# Patient Record
Sex: Female | Born: 1950 | Race: White | Hispanic: No | Marital: Married | Smoking: Never smoker
Health system: Southern US, Community
[De-identification: ages and names within clinical notes are randomized; demographics above are authoritative.]

## PROBLEM LIST (undated history)

## (undated) DIAGNOSIS — R112 Nausea with vomiting, unspecified: Secondary | ICD-10-CM

## (undated) DIAGNOSIS — M25561 Pain in right knee: Secondary | ICD-10-CM

## (undated) DIAGNOSIS — M1990 Unspecified osteoarthritis, unspecified site: Secondary | ICD-10-CM

## (undated) DIAGNOSIS — E119 Type 2 diabetes mellitus without complications: Secondary | ICD-10-CM

## (undated) DIAGNOSIS — K635 Polyp of colon: Secondary | ICD-10-CM

## (undated) DIAGNOSIS — M19019 Primary osteoarthritis, unspecified shoulder: Secondary | ICD-10-CM

## (undated) DIAGNOSIS — Z9889 Other specified postprocedural states: Secondary | ICD-10-CM

## (undated) DIAGNOSIS — I10 Essential (primary) hypertension: Secondary | ICD-10-CM

## (undated) DIAGNOSIS — R011 Cardiac murmur, unspecified: Secondary | ICD-10-CM

## (undated) DIAGNOSIS — B019 Varicella without complication: Secondary | ICD-10-CM

## (undated) DIAGNOSIS — K5792 Diverticulitis of intestine, part unspecified, without perforation or abscess without bleeding: Secondary | ICD-10-CM

## (undated) DIAGNOSIS — T7840XA Allergy, unspecified, initial encounter: Secondary | ICD-10-CM

## (undated) DIAGNOSIS — F32A Depression, unspecified: Secondary | ICD-10-CM

## (undated) DIAGNOSIS — E785 Hyperlipidemia, unspecified: Secondary | ICD-10-CM

## (undated) DIAGNOSIS — K921 Melena: Secondary | ICD-10-CM

## (undated) DIAGNOSIS — F419 Anxiety disorder, unspecified: Secondary | ICD-10-CM

## (undated) DIAGNOSIS — R87619 Unspecified abnormal cytological findings in specimens from cervix uteri: Secondary | ICD-10-CM

## (undated) DIAGNOSIS — H409 Unspecified glaucoma: Secondary | ICD-10-CM

## (undated) DIAGNOSIS — F329 Major depressive disorder, single episode, unspecified: Secondary | ICD-10-CM

## (undated) DIAGNOSIS — K219 Gastro-esophageal reflux disease without esophagitis: Secondary | ICD-10-CM

## (undated) HISTORY — DX: Primary osteoarthritis, unspecified shoulder: M19.019

## (undated) HISTORY — DX: Polyp of colon: K63.5

## (undated) HISTORY — DX: Varicella without complication: B01.9

## (undated) HISTORY — DX: Melena: K92.1

## (undated) HISTORY — DX: Type 2 diabetes mellitus without complications: E11.9

## (undated) HISTORY — PX: CARPAL TUNNEL RELEASE: SHX101

## (undated) HISTORY — DX: Unspecified abnormal cytological findings in specimens from cervix uteri: R87.619

## (undated) HISTORY — DX: Hyperlipidemia, unspecified: E78.5

## (undated) HISTORY — DX: Allergy, unspecified, initial encounter: T78.40XA

## (undated) HISTORY — DX: Gastro-esophageal reflux disease without esophagitis: K21.9

## (undated) HISTORY — DX: Unspecified glaucoma: H40.9

## (undated) HISTORY — PX: HAND SURGERY: SHX662

## (undated) HISTORY — DX: Diverticulitis of intestine, part unspecified, without perforation or abscess without bleeding: K57.92

## (undated) HISTORY — DX: Depression, unspecified: F32.A

## (undated) HISTORY — DX: Anxiety disorder, unspecified: F41.9

## (undated) HISTORY — PX: HEMORRHOID SURGERY: SHX153

## (undated) HISTORY — DX: Major depressive disorder, single episode, unspecified: F32.9

## (undated) HISTORY — DX: Pain in right knee: M25.561

## (undated) HISTORY — PX: JOINT REPLACEMENT: SHX530

## (undated) HISTORY — DX: Cardiac murmur, unspecified: R01.1

## (undated) HISTORY — PX: OTHER SURGICAL HISTORY: SHX169

---

## 1998-05-18 ENCOUNTER — Ambulatory Visit (HOSPITAL_COMMUNITY): Admission: RE | Admit: 1998-05-18 | Discharge: 1998-05-18 | Payer: Self-pay | Admitting: *Deleted

## 1998-05-18 ENCOUNTER — Encounter: Payer: Self-pay | Admitting: *Deleted

## 1999-07-18 ENCOUNTER — Other Ambulatory Visit: Admission: RE | Admit: 1999-07-18 | Discharge: 1999-07-18 | Payer: Self-pay | Admitting: Internal Medicine

## 1999-07-21 ENCOUNTER — Encounter: Payer: Self-pay | Admitting: Internal Medicine

## 1999-07-21 ENCOUNTER — Ambulatory Visit (HOSPITAL_COMMUNITY): Admission: RE | Admit: 1999-07-21 | Discharge: 1999-07-21 | Payer: Self-pay | Admitting: Obstetrics and Gynecology

## 2000-06-21 ENCOUNTER — Encounter (INDEPENDENT_AMBULATORY_CARE_PROVIDER_SITE_OTHER): Payer: Self-pay | Admitting: Specialist

## 2000-06-21 ENCOUNTER — Other Ambulatory Visit: Admission: RE | Admit: 2000-06-21 | Discharge: 2000-06-21 | Payer: Self-pay | Admitting: *Deleted

## 2000-09-20 ENCOUNTER — Ambulatory Visit (HOSPITAL_COMMUNITY): Admission: RE | Admit: 2000-09-20 | Discharge: 2000-09-20 | Payer: Self-pay | Admitting: Internal Medicine

## 2000-09-20 ENCOUNTER — Encounter: Payer: Self-pay | Admitting: Internal Medicine

## 2001-08-14 ENCOUNTER — Other Ambulatory Visit: Admission: RE | Admit: 2001-08-14 | Discharge: 2001-08-14 | Payer: Self-pay | Admitting: *Deleted

## 2005-05-17 ENCOUNTER — Encounter: Admission: RE | Admit: 2005-05-17 | Discharge: 2005-05-17 | Payer: Self-pay | Admitting: Internal Medicine

## 2006-07-18 ENCOUNTER — Encounter: Admission: RE | Admit: 2006-07-18 | Discharge: 2006-07-18 | Payer: Self-pay

## 2006-09-23 ENCOUNTER — Inpatient Hospital Stay (HOSPITAL_COMMUNITY): Admission: RE | Admit: 2006-09-23 | Discharge: 2006-09-26 | Payer: Self-pay | Admitting: Orthopedic Surgery

## 2007-06-06 ENCOUNTER — Emergency Department: Payer: Self-pay | Admitting: Emergency Medicine

## 2007-09-23 ENCOUNTER — Encounter: Admission: RE | Admit: 2007-09-23 | Discharge: 2007-09-23 | Payer: Self-pay | Admitting: Internal Medicine

## 2007-11-08 ENCOUNTER — Emergency Department: Payer: Self-pay | Admitting: Emergency Medicine

## 2007-12-04 ENCOUNTER — Encounter: Admission: RE | Admit: 2007-12-04 | Discharge: 2007-12-04 | Payer: Self-pay | Admitting: Orthopedic Surgery

## 2008-03-22 ENCOUNTER — Encounter: Admission: RE | Admit: 2008-03-22 | Discharge: 2008-03-22 | Payer: Self-pay | Admitting: Internal Medicine

## 2008-09-23 ENCOUNTER — Encounter: Admission: RE | Admit: 2008-09-23 | Discharge: 2008-09-23 | Payer: Self-pay | Admitting: Internal Medicine

## 2009-10-06 ENCOUNTER — Encounter: Admission: RE | Admit: 2009-10-06 | Discharge: 2009-10-06 | Payer: Self-pay | Admitting: Internal Medicine

## 2010-09-29 NOTE — Op Note (Signed)
NAMEMarland Kitchen  Desiree Pittman, Desiree Pittman NO.:  [id number]   MEDICAL RECORD NO.:  [id number]          PATIENT TYPE:  INP   LOCATION:  X009                         FACILITY:  [facility]   PHYSICIAN:  Ollen Gross, M.D.    DATE OF BIRTH:  1951/04/14   DATE OF PROCEDURE:  DATE OF DISCHARGE:                               OPERATIVE REPORT   PREOPERATIVE DIAGNOSIS:  Osteoarthritis, left hip.   POSTOPERATIVE DIAGNOSIS:  Osteoarthritis, left hip.   PROCEDURE:  Left total hip arthroplasty.   SURGEON:  Ollen Gross, M.D.   ASSISTANT:  Alexzandrew L. Perkins, P.A.-C.   ANESTHESIA:  General.   ESTIMATED BLOOD LOSS:  General.   ESTIMATED BLOOD LOSS:  400.   DRAINS:  Hemovac x1.   COMPLICATIONS:  None.   CONDITION:  Stable to recovery.   CLINICAL NOTE:  Desiree Pittman is a 60 year old female who has end-stage  arthritis of the left hip, progressively worsening pain and dysfunction.  She has failed nonoperative management and presents for total hip  arthroplasty.   PROCEDURE IN DETAIL:  After the successful administration of a general  anesthetic, the patient is placed in a right lateral decubitus position  with the left side up and held with the hip positioner.  The left lower  extremity is isolated from her perineum with plastic drapes and prepped  and draped in the usual sterile fashion.  A short posterolateral  incision is made with a 10 blade through subcutaneous tissue to the  level of fascia lata, which is incised in line with the skin incision.  The sciatic nerve is palpated and protected and the short rotators  isolated off the femur.  Capsulectomy is performed and the hip is  dislocated.  The center of the femoral head is marked and a trial  prosthesis placed such that the center of the trial head corresponds to  the center of the native femoral head.  Osteotomy lines are marked on  the femoral neck and osteotomy made with an oscillating saw.  The  femoral head is removed and  the femur retracted anteriorly to gain  acetabular exposure.   Acetabular retractors are placed.  Labrum and osteophytes removed.  Reaming starts at 43 mm, coursing increments of 2 up to 49 mm.  Then a  50 mm Pinnacle acetabular shell is placed in anatomic position and  transfixed with a single dome screw with excellent purchase.  The apex  hole eliminator is placed and the permanent 36 mm neutral Ultramet metal  liner is placed for a metal-on-metal hip replacement.   The femur is prepared with the canal finder and irrigation.  Axial  reaming is performed up to 11.5 mm, proximal reaming to a 16-D and the  sleeve machined to a small.  A 16-D small trial sleeve is placed, a 16 x  11 stem, 36 standard neck, matching her native anteversion.  The trial  36 +0 head is placed.  The hip is reduced with outstanding stability.  It has full extension, full external rotation, 70 degrees flexion, 40  degrees adduction, 90 degrees internal rotation, then 90 degrees  flexion,  90 degrees internal rotation.  By placing the left leg on top  of the right, I felt as though the leg lengths were equal.  The hip was  then dislocated, trial femur removed and trial sleeve removed.  The  permanent 16 D small sleeve was then placed with a 16 x 11 stem, 36  standard neck, matching native anteversion.  A 36 +0 permanent head is  placed and the hip is reduced with the same stability parameters.  The  wound is copiously irrigated with saline solution and the short rotators  reattached to the femur through drill holes.  Fascia lata is closed over  a Hemovac drain with interrupted #1 Vicryl, subcu closed with #1 and #2-  0 Vicryl and subcuticular running 4-0 Monocryl.  The drain is hooked to  suction, incision cleaned and dried.  Steri-Strips and a bulky sterile  dressing applied.  She is then placed into a knee immobilizer, awakened,  and transferred to recovery in stable condition.      Ollen Gross, M.D.   Electronically Signed     FA/MEDQ  D:  09/23/2006  T:  09/23/2006  Job:  [id number]

## 2010-09-29 NOTE — Discharge Summary (Signed)
Desiree Pittman, Desiree Pittman NO.:  [id number]   MEDICAL RECORD NO.:  [id number]          PATIENT TYPE:  INP   LOCATION:  1620                         FACILITY:  [facility]   PHYSICIAN:  Ollen Gross, M.D.    DATE OF BIRTH:  [birth year]   DATE OF ADMISSION:  09/23/2006  DATE OF DISCHARGE:                               DISCHARGE SUMMARY   ADMITTING DIAGNOSES:  1. Osteoarthritis left hip.  2. Hypertension.  3. Hemorrhoids.  4. Postmenopausal.   DISCHARGE DIAGNOSES:  1. Osteoarthritis left hip status post left total hip arthroplasty.  2. Hypertension.  3. Hemorrhoids.  4. Postmenopausal.   PROCEDURE:  Sep 23, 2006, left total hip.  Surgeon:  Dr. Lequita Halt.  Assistant:  Avel Peace PA-C.  Anesthesia:  General.   CONSULTS:  None.   BRIEF HISTORY:  Ms. Nissan is a 60 year old female with end-stage  arthritis of left hip, progressive worsening pain and dysfunction,  failed nonoperative management and now presents for total hip  arthroplasty.   LABORATORY DATA:  Preoperative CBC showed hemoglobin of 14.5, hematocrit  of 42.5, normal white count of 6.9.  Serial H&H's were followed.  Last  noted H&H 11.1 and 32.0.  PT/PTT on admission 12.4 and 28 respectively,  INR 0.9.  Serial pro times followed.  Last noted PT/INR 18.4 and 1.5.  Chemistry panel on admission:  Minimally elevated sodium of 147.  Remaining chemistry panel within normal limits.  Serial BMETs were  followed.  Electrolytes remained within normal limits.  Preoperative UA  negative.  Blood group/type A positive.  Two-view chest Sep 13, 2006:  No  active cardiopulmonary disease.  EKG dated August 06, 2006:  Sinus  rhythm, within normal limits, confirmed.   HOSPITAL COURSE:  The patient admitted to [facility],  tolerated procedure well, later transferred to the recovery room and the  orthopedic floor.  Start on PCA and p.o. analgesic for pain control  following surgery.  Given 24 hours postoperative IV  antibiotics.  Started on Coumadin for DVT prophylaxis.  Actually was doing very well  on day #1, just had some incisional burning, tolerating medications  well.  Started getting up out of bed.  Hemoglobin looked good.  Blood  pressure looked good, a little on the lower side, so we held just the  Cozaar temporarily.  Discontinued the PCA after breakfast.  Started  getting up with PT.  Had good urinary output.  On day #2 was doing  fantastic, getting up ambulating 90 feet, then later 150 feet that day.  Dressing was changed and incision looked excellent.  Hemoglobin still  11.  Continued to do well and was ready to go home by the following day  of Sep 26, 2006.   DISCHARGE PLAN:  1. The patient was discharged home on Sep 26, 2006.  2. Discharge diagnoses:  Please see above.  3. Discharge medications:  Vicodin, Robaxin, and Coumadin.  4. Diet:  Low sodium diet.  5. Followup:  Two weeks.  Contact the office at [phone] to arrange      appointment time and followup with this patient.  6.  Activity:  Partial weightbearing 25-50% left lower extremity.  Home      health PT, home health nursing, total protocol, hip precautions.   DISPOSITION:  Home.   CONDITION UPON DISCHARGE:  Improved.      Alexzandrew L. Perkins, P.A.C.      Ollen Gross, M.D.  Electronically Signed    ALP/MEDQ  D:  09/26/2006  T:  09/26/2006  Job:  [id number]   cc:   Merlene Laughter. Renae Gloss, M.D.  Fax: [phone]

## 2010-09-29 NOTE — H&P (Signed)
NAME:  Desiree Pittman, Desiree Pittman               ACCOUNT NO.:  [id number]   MEDICAL RECORD NO.:  [id number]          PATIENT TYPE:  INP   LOCATION:  NA                           [facility]ACILITY:  [facility]   PHYSICIAN:  Ollen Gross, M.D.    DATE OF BIRTH:  21-Nov-1950   DATE OF ADMISSION:  08/24/2006  DATE OF DISCHARGE:                              HISTORY & PHYSICAL   CHIEF COMPLAINT:  Left hip pain   HISTORY OF PRESENT ILLNESS:  The patient is a 60 year old female who has  been seen by Dr. Lequita Halt in second opinion in regards to her left hip  which has progressively gotten worse over the past 8-10 months.  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  She is extremely active, but the arthritis and  pain have curtailed her activities.  She was seen in the office.  Her x-  rays showed severe end-stage arthritis of the left hip with bone-on-bone  and large cyst change of the femoral head and acetabulum.  It is felt  she would benefit from undergoing hip replacement.  Risks and benefits  have been discussed, and the patient has elected to proceed with  surgery.   ALLERGIES:  NO KNOWN DRUG ALLERGIES.   CURRENT MEDICATIONS:  Wild yam, fish oil, Crestor, Cozaar, Cardizem,  Relafen, Osteo Bi-Flex.   PAST MEDICAL HISTORY:  Hypertension, hemorrhoids, postmenopausal.   PAST SURGICAL HISTORY:  Carpal tunnel on the left, carpal tunnel on the  right and colonoscopy.   SOCIAL HISTORY:  Married.  Dental hygienist.  Nonsmoker.  One glass of  wine per day.  Two children.  Husband will be assisting with care after  surgery.   FAMILY HISTORY:  Father with history of lung cancer.  Mother and  grandmother with history of arthritis.   REVIEW OF SYSTEMS:  GENERAL:  No fevers, chills or night sweats.  NEURO:  No seizures, syncope or paralysis.  RESPIRATORY:  No shortness of  breath, productive cough or hemoptysis.  CARDIOVASCULAR:  No chest pain,  angina or  orthopnea.  GI:  No nausea, vomiting, diarrhea or  constipation.  GU:  No dysuria, hematuria or discharge.  MUSCULOSKELETAL:  Left hip.   PHYSICAL EXAMINATION:  VITAL SIGNS:  Pulse 68, respirations 16, blood  pressure 148/84.  GENERAL:  60 year old white female, well-nourished, well-developed, in  no acute distress.  She is alert, oriented and cooperative, pleasant and  an excellent historian.  HEENT:  Normocephalic, atraumatic.  Pupils round and reactive.  Oropharynx clear.  EOMs intact.  NECK:  Supple.  CHEST:  Clear.  HEART:  Regular rate and rhythm.  ABDOMEN:  Soft, nontender.  Bowel sounds present.  GENITALIA:  Not done.  EXTREMITIES:  Left hip shows flexion of 90 degrees, 0 internal rotation,  20 degrees of external rotation, 20-30 degrees of abduction.   IMPRESSION:  1. Osteoarthritis of the left hip.  2. Hypertension.  3. Hemorrhoids.  4. Postmenopausal.   PLAN:  The patient will be admitted to [facility] to  undergo a  left total hip replacement arthroplasty.  Surgery will be performed by  Dr. Ollen Gross.      Alexzandrew L. Perkins, P.A.C.      Ollen Gross, M.D.  Electronically Signed    ALP/MEDQ  D:  09/22/2006  T:  09/23/2006  Job:  [id number]   cc:   Merlene Laughter. Renae Gloss, M.D.  Fax: [phone]

## 2010-12-11 ENCOUNTER — Other Ambulatory Visit: Payer: Self-pay | Admitting: Obstetrics and Gynecology

## 2010-12-11 DIAGNOSIS — Z1231 Encounter for screening mammogram for malignant neoplasm of breast: Secondary | ICD-10-CM

## 2010-12-19 ENCOUNTER — Ambulatory Visit
Admission: RE | Admit: 2010-12-19 | Discharge: 2010-12-19 | Disposition: A | Discharge status: Home / Self Care | Payer: BC Managed Care – PPO | Source: Ambulatory Visit | Attending: Obstetrics and Gynecology | Admitting: Obstetrics and Gynecology

## 2010-12-19 DIAGNOSIS — Z1231 Encounter for screening mammogram for malignant neoplasm of breast: Secondary | ICD-10-CM

## 2010-12-22 ENCOUNTER — Other Ambulatory Visit: Payer: Self-pay | Admitting: Obstetrics and Gynecology

## 2010-12-22 DIAGNOSIS — R928 Other abnormal and inconclusive findings on diagnostic imaging of breast: Secondary | ICD-10-CM

## 2011-01-02 ENCOUNTER — Ambulatory Visit
Admission: RE | Admit: 2011-01-02 | Discharge: 2011-01-02 | Disposition: A | Discharge status: Home / Self Care | Payer: BC Managed Care – PPO | Source: Ambulatory Visit | Attending: Obstetrics and Gynecology | Admitting: Obstetrics and Gynecology

## 2011-01-02 DIAGNOSIS — R928 Other abnormal and inconclusive findings on diagnostic imaging of breast: Secondary | ICD-10-CM

## 2012-01-15 ENCOUNTER — Other Ambulatory Visit: Payer: Self-pay | Admitting: Obstetrics and Gynecology

## 2012-01-15 DIAGNOSIS — Z1231 Encounter for screening mammogram for malignant neoplasm of breast: Secondary | ICD-10-CM

## 2012-02-05 ENCOUNTER — Ambulatory Visit
Admission: RE | Admit: 2012-02-05 | Discharge: 2012-02-05 | Disposition: A | Discharge status: Home / Self Care | Payer: BC Managed Care – PPO | Source: Ambulatory Visit | Attending: Obstetrics and Gynecology | Admitting: Obstetrics and Gynecology

## 2012-02-05 DIAGNOSIS — Z1231 Encounter for screening mammogram for malignant neoplasm of breast: Secondary | ICD-10-CM

## 2012-02-06 ENCOUNTER — Emergency Department: Payer: Self-pay | Admitting: Emergency Medicine

## 2012-02-07 ENCOUNTER — Other Ambulatory Visit: Payer: Self-pay | Admitting: Obstetrics and Gynecology

## 2012-02-07 DIAGNOSIS — R928 Other abnormal and inconclusive findings on diagnostic imaging of breast: Secondary | ICD-10-CM

## 2012-02-12 ENCOUNTER — Ambulatory Visit
Admission: RE | Admit: 2012-02-12 | Discharge: 2012-02-12 | Disposition: A | Discharge status: Home / Self Care | Payer: BC Managed Care – PPO | Source: Ambulatory Visit | Attending: Obstetrics and Gynecology | Admitting: Obstetrics and Gynecology

## 2012-02-12 DIAGNOSIS — R928 Other abnormal and inconclusive findings on diagnostic imaging of breast: Secondary | ICD-10-CM

## 2012-03-06 ENCOUNTER — Other Ambulatory Visit: Payer: Self-pay | Admitting: Orthopedic Surgery

## 2012-03-06 MED ORDER — BUPIVACAINE LIPOSOME 1.3 % IJ SUSP: 20.0000 mL | Freq: Once | INTRAMUSCULAR | Status: DC

## 2012-03-06 MED ORDER — DEXAMETHASONE SODIUM PHOSPHATE 10 MG/ML IJ SOLN: 10.0000 mg | Freq: Once | INTRAMUSCULAR | Status: DC

## 2012-03-06 NOTE — Progress Notes (Signed)
Preoperative surgical orders have been place into the Epic hospital system for Desiree Pittman on 03/06/2012, 2:54 PM  by Patrica Duel for surgery on 04/23/12.  Preop Total Hip orders including Experel Injecion, IV Tylenol, and IV Decadron as long as there are no contraindications to the above medications. Avel Peace, PA-C

## 2012-04-04 ENCOUNTER — Encounter (HOSPITAL_COMMUNITY): Payer: Self-pay | Admitting: Pharmacy Technician

## 2012-04-09 ENCOUNTER — Other Ambulatory Visit (HOSPITAL_COMMUNITY): Payer: Self-pay | Admitting: *Deleted

## 2012-04-14 ENCOUNTER — Other Ambulatory Visit (HOSPITAL_COMMUNITY): Payer: Self-pay | Admitting: Orthopedic Surgery

## 2012-04-14 ENCOUNTER — Ambulatory Visit (HOSPITAL_COMMUNITY): Admission: RE | Admit: 2012-04-14 | Payer: BC Managed Care – PPO | Source: Ambulatory Visit

## 2012-04-14 ENCOUNTER — Encounter (HOSPITAL_COMMUNITY)
Admission: RE | Admit: 2012-04-14 | Discharge: 2012-04-14 | Disposition: A | Discharge status: Home / Self Care | Payer: BC Managed Care – PPO | Source: Ambulatory Visit | Attending: Orthopedic Surgery | Admitting: Orthopedic Surgery

## 2012-04-14 ENCOUNTER — Ambulatory Visit (HOSPITAL_COMMUNITY)
Admission: RE | Admit: 2012-04-14 | Discharge: 2012-04-14 | Disposition: A | Discharge status: Home / Self Care | Payer: BC Managed Care – PPO | Source: Ambulatory Visit | Attending: Orthopedic Surgery | Admitting: Orthopedic Surgery

## 2012-04-14 ENCOUNTER — Encounter (HOSPITAL_COMMUNITY): Payer: Self-pay

## 2012-04-14 DIAGNOSIS — Z01811 Encounter for preprocedural respiratory examination: Secondary | ICD-10-CM | POA: Insufficient documentation

## 2012-04-14 HISTORY — DX: Nausea with vomiting, unspecified: R11.2

## 2012-04-14 HISTORY — DX: Other specified postprocedural states: R11.2

## 2012-04-14 HISTORY — DX: Other specified postprocedural states: Z98.890

## 2012-04-14 HISTORY — DX: Essential (primary) hypertension: I10

## 2012-04-14 HISTORY — DX: Unspecified osteoarthritis, unspecified site: M19.90

## 2012-04-14 LAB — COMPREHENSIVE METABOLIC PANEL
Alkaline Phosphatase: 80 U/L (ref 39–117)
BUN: 16 mg/dL (ref 6–23)
CO2: 28 mEq/L (ref 19–32)
Chloride: 99 mEq/L (ref 96–112)
GFR calc Af Amer: >90 mL/min (ref >90)
GFR calc non Af Amer: >90 mL/min (ref >90)
Glucose, Bld: 97 mg/dL (ref 70–99)
Potassium: 4.4 mEq/L (ref 3.5–5.1)
Total Bilirubin: 0.4 mg/dL (ref 0.3–1.2)

## 2012-04-14 LAB — URINALYSIS, ROUTINE W REFLEX MICROSCOPIC
Bilirubin Urine: NEGATIVE
Hgb urine dipstick: NEGATIVE
Specific Gravity, Urine: 1.008 (ref 1.005–1.030)
Urobilinogen, UA: 0.2 mg/dL (ref 0.0–1.0)

## 2012-04-14 LAB — CBC
HCT: 43.9 % (ref 36.0–46.0)
Hemoglobin: 15 g/dL (ref 12.0–15.0)
WBC: 6.8 10*3/uL (ref 4.0–10.5)

## 2012-04-14 LAB — PROTIME-INR: Prothrombin Time: 11.8 seconds (ref 11.6–15.2)

## 2012-04-14 LAB — APTT: aPTT: 26 seconds (ref 24–37)

## 2012-04-14 NOTE — Patient Instructions (Signed)
20      Your procedure is scheduled on:  Wednesday 04/23/2012 at 1100 am  Report to [facility] at 0830  AM.  Call this number if you have problems the morning of surgery: [phone]   Remember:   Do not eat food or drink liquids after midnight!  Take these medicines the morning of surgery with A SIP OF WATER: Diltiazem   Do not bring valuables to the hospital.  .  Leave suitcase in the car. After surgery it may be brought to your room.  For patients admitted to the hospital, checkout time is 11:00 AM the day of              Discharge.    Special Instructions: See [facility] Preparing  For Surgery Instruction Sheet. Do not wear jewelry, lotions powders, perfumes. Women do not shave legs or underarms for 12 hours before showers. Contacts, partial plates, or dentures may not be worn into surgery.                          Patients discharged the day of surgery will not be allowed to drive home. If going home the same day of surgery, must have someone stay with you first 24 hrs.at home and arrange for someone to drive you home from the  Hospital.              YOUR DRIVER WU:[id number] Hyacinth Meeker   Please read over the following fact sheets that you were given: MRSA INFORMATION,INCENTIVE SPIROMETRY SHEET, SLEEP APNEA SHEET, BLOOD TRANSFUSION SHEET                            Telford Nab.Wendal Wilkie,RN,BSN     [phone]

## 2012-04-22 ENCOUNTER — Other Ambulatory Visit: Payer: Self-pay | Admitting: Orthopedic Surgery

## 2012-04-22 NOTE — H&P (Signed)
Desiree Pittman  DOB: 08/21/1950 Married / Language: English / Race: White Female  Date of Admission:  04/23/2012  Chief complaint: Bilateral hip pain  History of Present Illness The patient is a 61 year old female who comes in today for a preoperative History and Physical. The patient is scheduled for a right total hip arthroplasty and a left hip bearing exchange versus left total hip revision to be performed by Dr. Frank V. Aluisio, MD at [facility] on 04/23/2012. The patient is a 60 year old female who presented with a hip problems. The patient reports left lateral hip and right anterior hip problems including pain. Desiree Pittman comes in for recheck of her left total hip arthroplasty and right hip arthritis. Her left total hip, which she had done in 2008 gave her several episodes of clunking and popping. She interpreted these as dislocations. She never had to go to the hospital for this. Her husband would just kind of pick her up and let her leg dangle and it would feel like it went back in. Over the last year she has not had any further episodes of that, but this past week had an episode of it while working out in the garden. Again, her husband just picked her up and let her hang her leg and it felt like it went back into place. She does not have much pain at rest, but with activities she has pain and it gives way and feels like it catches intermittently. No fevers, constitutional symptoms. She got a MARS MRI of her left hip to look for both effusion and impingement and will also get metallosis labs. The MRI scan was reviews. She definitely has a large fluid collection. There was concerned she may have a sensitivity to the metal. It wasn't felt that she had metallosis because her iron levels are negligible. Iy rect was recommended that she revise the bearing surface and probably have to revise the cuff because of the low abduction angle. She might be getting some  impingement. We discussed all this in detail. She wants to proceed with the surgery. Her right hip has just been giving her achy discomfort in the anterior groin with activities.The patient is being followed for their right hip pain. They are several months out from intra articular She wuold like to go ahead and get the right hip replaced at the same time if all possible. They have been treated conservatively in the past for the above stated problem and despite conservative measures, they continue to have progressive pain and severe functional limitations and dysfunction. They have failed non-operative management including home exercise, medications, and injections. It is felt that they would benefit from undergoing total joint replacement. Risks and benefits of the procedure have been discussed with the patient and they elect to proceed with surgery. There are no active contraindications to surgery such as ongoing infection or rapidly progressive neurological disease.     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 12/13/2006   Allergies No Known Drug Allergies.   Family History Cerebrovascular Accident. mother Hypertension. mother Osteoarthritis. mother and grandmother mothers side Cancer. father and grandfather mothers side   Social History Living situation. live with spouse Marital status. married Number of flights of stairs before winded. 2-3 Drug/Alcohol Rehab (Previously). no Exercise. Exercises monthly; does running / walking and other Illicit drug use. no Pain Contract. no Tobacco use. never smoker Children. 2 Current work status. working full time Drug/Alcohol Rehab (Currently).   no Alcohol use. current drinker; drinks wine; 8-14 per week   Medication History Diltiazem CD (240MG Capsule ER 24HR, Oral) Active. Losartan Potassium-HCTZ ( Oral) Specific dose unknown - Active. Norco (5-325MG  Tablet, 1-2 Tablet Oral po qhs prn pain, Taken starting 04/15/2012) Active. (called to [facility] [id number]) Mobic (7.5MG/5ML Suspension, Oral) Active. TraMADol HCl (50MG Tablet, Oral) Active. (prn) Aspirin EC Low Strength (81MG Tablet DR, Oral) Active.   Pregnancy / Birth History Pregnant. no   Past Surgical History Carpal Tunnel Repair. bilateral Total Hip Replacement. left Tendon/Nerve/Artery Repair Left Finger   Medical History High blood pressure Osteoarthritis Hemorrhoids Menopause   Review of Systems General:Not Present- Chills, Fever, Night Sweats, Fatigue, Weight Gain, Weight Loss and Memory Loss. Skin:Not Present- Hives, Itching, Rash, Eczema and Lesions. HEENT:Not Present- Tinnitus, Headache, Double Vision, Visual Loss, Hearing Loss and Dentures. Respiratory:Not Present- Shortness of breath with exertion, Shortness of breath at rest, Allergies, Coughing up blood and Chronic Cough. Cardiovascular:Not Present- Chest Pain, Racing/skipping heartbeats, Difficulty Breathing Lying Down, Murmur, Swelling and Palpitations. Gastrointestinal:Not Present- Bloody Stool, Heartburn, Abdominal Pain, Vomiting, Nausea, Constipation, Diarrhea, Difficulty Swallowing, Jaundice and Loss of appetitie. Female Genitourinary:Not Present- Blood in Urine, Urinary frequency, Weak urinary stream, Discharge, Flank Pain, Incontinence, Painful Urination, Urgency, Urinary Retention and Urinating at Night. Musculoskeletal:Present- Joint Swelling, Joint Pain and Back Pain. Not Present- Muscle Weakness, Muscle Pain, Morning Stiffness and Spasms. Neurological:Not Present- Tremor, Dizziness, Blackout spells, Paralysis, Difficulty with balance and Weakness. Psychiatric:Not Present- Insomnia.   Vitals Weight: 142 lb Height: 63 in Weight was reported by patient. Height was reported by patient. Body Surface Area: 1.69 m Body Mass Index: 25.15 kg/m Pulse: 84 (Regular) Resp.: 16  (Unlabored) BP: 134/78 (Sitting, Left Arm, Standard)    Physical Exam The physical exam findings are as follows:   General Mental Status - Alert, cooperative and good historian. General Appearance- pleasant. Not in acute distress. Orientation- Oriented X3. Build & Nutrition- Well nourished and Well developed.   Head and Neck Head- normocephalic, atraumatic . Neck Global Assessment- supple. no bruit auscultated on the right and no bruit auscultated on the left.   Eye Pupil- Bilateral- Regular and Round. Motion- Bilateral- EOMI.   Chest and Lung Exam Auscultation: Breath sounds:- clear at anterior chest wall and - clear at posterior chest wall. Adventitious sounds:- No Adventitious sounds.   Cardiovascular Auscultation:Rhythm- Regular rate and rhythm. Heart Sounds- S1 WNL and S2 WNL. Murmurs & Other Heart Sounds:Auscultation of the heart reveals - No Murmurs.   Abdomen Palpation/Percussion:Tenderness- Abdomen is non-tender to palpation. Rigidity (guarding)- Abdomen is soft. Auscultation:Auscultation of the abdomen reveals - Bowel sounds normal.   Female Genitourinary Not done, not pertinent to present illness  Musculoskeletal Well developed female, alert and oriented in no apparent distress. Right hip flexion is to about 100, minimal internal rotation, about 10 external rotation, 30 abduction. Left hip flexion is to 110, rotation in 30, out 40, abduction 40 without discomfort. I reviewed her plain radiographs from back around April. The prosthesis is in good position. She has metal on metal. Cup is slightly horizontal, but the abduction angle is in the 40's. She does not have any osteolysis. No bone loss. Right hip has bone on bone arthritis.  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  Plan is for home.    Signed electronically by DREW L PERKINS, PA-C  

## 2012-04-23 ENCOUNTER — Ambulatory Visit (HOSPITAL_COMMUNITY): Payer: BC Managed Care – PPO | Admitting: Anesthesiology

## 2012-04-23 ENCOUNTER — Encounter (HOSPITAL_COMMUNITY): Payer: Self-pay | Admitting: *Deleted

## 2012-04-23 ENCOUNTER — Inpatient Hospital Stay (HOSPITAL_COMMUNITY)
Admission: RE | Admit: 2012-04-23 | Discharge: 2012-04-26 | DRG: 471 | Disposition: A | Discharge status: Home Health | Payer: BC Managed Care – PPO | Source: Ambulatory Visit | Attending: Orthopedic Surgery | Admitting: Orthopedic Surgery

## 2012-04-23 ENCOUNTER — Ambulatory Visit (HOSPITAL_COMMUNITY): Payer: BC Managed Care – PPO

## 2012-04-23 ENCOUNTER — Encounter (HOSPITAL_COMMUNITY): Payer: Self-pay | Admitting: Anesthesiology

## 2012-04-23 ENCOUNTER — Encounter (HOSPITAL_COMMUNITY)
Admission: RE | Disposition: A | Discharge status: Home Health | Payer: Self-pay | Source: Ambulatory Visit | Attending: Orthopedic Surgery

## 2012-04-23 DIAGNOSIS — M169 Osteoarthritis of hip, unspecified: Secondary | ICD-10-CM | POA: Diagnosis present

## 2012-04-23 DIAGNOSIS — M1610 Unilateral primary osteoarthritis, unspecified hip: Secondary | ICD-10-CM | POA: Diagnosis present

## 2012-04-23 DIAGNOSIS — D62 Acute posthemorrhagic anemia: Secondary | ICD-10-CM | POA: Diagnosis not present

## 2012-04-23 DIAGNOSIS — T84099A Other mechanical complication of unspecified internal joint prosthesis, initial encounter: Principal | ICD-10-CM | POA: Diagnosis present

## 2012-04-23 DIAGNOSIS — Y831 Surgical operation with implant of artificial internal device as the cause of abnormal reaction of the patient, or of later complication, without mention of misadventure at the time of the procedure: Secondary | ICD-10-CM | POA: Diagnosis present

## 2012-04-23 DIAGNOSIS — Z96649 Presence of unspecified artificial hip joint: Secondary | ICD-10-CM

## 2012-04-23 DIAGNOSIS — E871 Hypo-osmolality and hyponatremia: Secondary | ICD-10-CM | POA: Diagnosis not present

## 2012-04-23 DIAGNOSIS — I10 Essential (primary) hypertension: Secondary | ICD-10-CM | POA: Diagnosis present

## 2012-04-23 HISTORY — PX: TOTAL HIP ARTHROPLASTY: SHX124

## 2012-04-23 HISTORY — PX: TOTAL HIP REVISION: SHX763

## 2012-04-23 LAB — GRAM STAIN

## 2012-04-23 SURGERY — ARTHROPLASTY, HIP, TOTAL, ANTERIOR APPROACH
Anesthesia: General | Site: Hip | Laterality: Right | Wound class: Clean

## 2012-04-23 MED ORDER — PHENOL 1.4 % MT LIQD
1.0000 | OROMUCOSAL | Status: DC | PRN
  Filled 2012-04-23: qty 177

## 2012-04-23 MED ORDER — SODIUM CHLORIDE 0.9 % IJ SOLN
INTRAMUSCULAR | Status: DC | PRN
  Administered 2012-04-23: 50 mL

## 2012-04-23 MED ORDER — MIDAZOLAM HCL 5 MG/5ML IJ SOLN
INTRAMUSCULAR | Status: DC | PRN
  Administered 2012-04-23: 2 mg via INTRAVENOUS

## 2012-04-23 MED ORDER — HETASTARCH-ELECTROLYTES 6 % IV SOLN
INTRAVENOUS | Status: DC | PRN
  Administered 2012-04-23: 15:00:00 via INTRAVENOUS

## 2012-04-23 MED ORDER — RIVAROXABAN 10 MG PO TABS
10.0000 mg | ORAL_TABLET | Freq: Every day | ORAL | Status: DC
  Administered 2012-04-24 – 2012-04-26 (×3): 10 mg via ORAL
  Filled 2012-04-23 (×4): qty 1

## 2012-04-23 MED ORDER — HEPARIN SODIUM (PORCINE) 5000 UNIT/ML IJ SOLN
5000.0000 [IU] | Freq: Once | INTRAMUSCULAR | Status: DC
  Filled 2012-04-23: qty 1

## 2012-04-23 MED ORDER — ONDANSETRON HCL 4 MG/2ML IJ SOLN: 4.0000 mg | Freq: Four times a day (QID) | INTRAMUSCULAR | Status: DC | PRN

## 2012-04-23 MED ORDER — MEPERIDINE HCL 50 MG/ML IJ SOLN: 6.2500 mg | INTRAMUSCULAR | Status: DC | PRN

## 2012-04-23 MED ORDER — DEXAMETHASONE 6 MG PO TABS
10.0000 mg | ORAL_TABLET | Freq: Once | ORAL | Status: AC
  Administered 2012-04-24: 10 mg via ORAL
  Filled 2012-04-23: qty 1

## 2012-04-23 MED ORDER — FLEET ENEMA 7-19 GM/118ML RE ENEM: 1.0000 | ENEMA | Freq: Once | RECTAL | Status: AC | PRN

## 2012-04-23 MED ORDER — SCOPOLAMINE 1 MG/3DAYS TD PT72
MEDICATED_PATCH | TRANSDERMAL | Status: AC
  Filled 2012-04-23: qty 1

## 2012-04-23 MED ORDER — PROPOFOL 10 MG/ML IV BOLUS
INTRAVENOUS | Status: DC | PRN
  Administered 2012-04-23: 150 mg via INTRAVENOUS

## 2012-04-23 MED ORDER — MORPHINE SULFATE 2 MG/ML IJ SOLN: 1.0000 mg | INTRAMUSCULAR | Status: DC | PRN

## 2012-04-23 MED ORDER — DOCUSATE SODIUM 100 MG PO CAPS
100.0000 mg | ORAL_CAPSULE | Freq: Two times a day (BID) | ORAL | Status: DC
  Administered 2012-04-23 – 2012-04-26 (×6): 100 mg via ORAL
  Filled 2012-04-23: qty 1

## 2012-04-23 MED ORDER — ONDANSETRON HCL 4 MG/2ML IJ SOLN
INTRAMUSCULAR | Status: DC | PRN
  Administered 2012-04-23 (×2): 4 mg via INTRAVENOUS

## 2012-04-23 MED ORDER — ACETAMINOPHEN 10 MG/ML IV SOLN
INTRAVENOUS | Status: AC
  Filled 2012-04-23: qty 100

## 2012-04-23 MED ORDER — MENTHOL 3 MG MT LOZG: 1.0000 | LOZENGE | OROMUCOSAL | Status: DC | PRN

## 2012-04-23 MED ORDER — METHOCARBAMOL 500 MG PO TABS
500.0000 mg | ORAL_TABLET | Freq: Four times a day (QID) | ORAL | Status: DC | PRN
  Filled 2012-04-23: qty 1

## 2012-04-23 MED ORDER — CEFAZOLIN SODIUM-DEXTROSE 2-3 GM-% IV SOLR
INTRAVENOUS | Status: AC
Start: 2012-04-23 — End: 2012-04-23
  Filled 2012-04-23: qty 50

## 2012-04-23 MED ORDER — OXYCODONE HCL 5 MG/5ML PO SOLN: 5.0000 mg | Freq: Once | ORAL | Status: DC | PRN

## 2012-04-23 MED ORDER — LACTATED RINGERS IV SOLN
INTRAVENOUS | Status: DC
  Administered 2012-04-23: 1000 mL via INTRAVENOUS
  Administered 2012-04-23: 13:00:00 via INTRAVENOUS

## 2012-04-23 MED ORDER — OXYCODONE HCL 5 MG PO TABS: 5.0000 mg | ORAL_TABLET | Freq: Once | ORAL | Status: DC | PRN

## 2012-04-23 MED ORDER — HYDROCHLOROTHIAZIDE 12.5 MG PO CAPS
12.5000 mg | ORAL_CAPSULE | Freq: Every day | ORAL | Status: DC
  Administered 2012-04-24 – 2012-04-26 (×3): 12.5 mg via ORAL
  Filled 2012-04-23 (×3): qty 1

## 2012-04-23 MED ORDER — BUPIVACAINE LIPOSOME 1.3 % IJ SUSP
INTRAMUSCULAR | Status: DC | PRN
  Administered 2012-04-23: 10 mL

## 2012-04-23 MED ORDER — DEXAMETHASONE SODIUM PHOSPHATE 10 MG/ML IJ SOLN
INTRAMUSCULAR | Status: DC | PRN
  Administered 2012-04-23: 10 mg via INTRAVENOUS

## 2012-04-23 MED ORDER — TRAMADOL HCL 50 MG PO TABS
50.0000 mg | ORAL_TABLET | Freq: Four times a day (QID) | ORAL | Status: DC | PRN
  Administered 2012-04-23 – 2012-04-25 (×2): 100 mg via ORAL
  Filled 2012-04-23 (×2): qty 2

## 2012-04-23 MED ORDER — METOCLOPRAMIDE HCL 5 MG/ML IJ SOLN
INTRAMUSCULAR | Status: DC | PRN
  Administered 2012-04-23: 10 mg via INTRAVENOUS

## 2012-04-23 MED ORDER — ROCURONIUM BROMIDE 100 MG/10ML IV SOLN
INTRAVENOUS | Status: DC | PRN
  Administered 2012-04-23: 20 mg via INTRAVENOUS
  Administered 2012-04-23: 40 mg via INTRAVENOUS
  Administered 2012-04-23: 20 mg via INTRAVENOUS

## 2012-04-23 MED ORDER — ACETAMINOPHEN 650 MG RE SUPP: 650.0000 mg | Freq: Four times a day (QID) | RECTAL | Status: DC | PRN

## 2012-04-23 MED ORDER — SCOPOLAMINE 1 MG/3DAYS TD PT72
1.0000 | MEDICATED_PATCH | TRANSDERMAL | Status: DC
  Administered 2012-04-23: 1.5 mg via TRANSDERMAL
  Filled 2012-04-23: qty 1

## 2012-04-23 MED ORDER — DEXAMETHASONE SODIUM PHOSPHATE 10 MG/ML IJ SOLN: 10.0000 mg | Freq: Once | INTRAMUSCULAR | Status: AC

## 2012-04-23 MED ORDER — TEMAZEPAM 15 MG PO CAPS
15.0000 mg | ORAL_CAPSULE | Freq: Every evening | ORAL | Status: DC | PRN
Start: 2012-04-23 — End: 2012-04-26

## 2012-04-23 MED ORDER — LOSARTAN POTASSIUM 50 MG PO TABS
50.0000 mg | ORAL_TABLET | Freq: Every day | ORAL | Status: DC
  Administered 2012-04-24 – 2012-04-26 (×3): 50 mg via ORAL
  Filled 2012-04-23 (×3): qty 1

## 2012-04-23 MED ORDER — PROPOFOL INFUSION 10 MG/ML OPTIME
INTRAVENOUS | Status: DC | PRN
  Administered 2012-04-23: 50 ug/kg/min via INTRAVENOUS

## 2012-04-23 MED ORDER — DEXTROSE-NACL 5-0.9 % IV SOLN
INTRAVENOUS | Status: DC
  Administered 2012-04-23: 18:00:00 via INTRAVENOUS
  Administered 2012-04-24: 20 mL via INTRAVENOUS

## 2012-04-23 MED ORDER — ACETAMINOPHEN 10 MG/ML IV SOLN: 1000.0000 mg | Freq: Once | INTRAVENOUS | Status: DC | PRN

## 2012-04-23 MED ORDER — PROMETHAZINE HCL 25 MG/ML IJ SOLN: 6.2500 mg | INTRAMUSCULAR | Status: DC | PRN

## 2012-04-23 MED ORDER — METOCLOPRAMIDE HCL 10 MG PO TABS: 5.0000 mg | ORAL_TABLET | Freq: Three times a day (TID) | ORAL | Status: DC | PRN

## 2012-04-23 MED ORDER — FENTANYL CITRATE 0.05 MG/ML IJ SOLN
INTRAMUSCULAR | Status: DC | PRN
  Administered 2012-04-23 (×2): 50 ug via INTRAVENOUS
  Administered 2012-04-23: 100 ug via INTRAVENOUS
  Administered 2012-04-23 (×6): 50 ug via INTRAVENOUS

## 2012-04-23 MED ORDER — LOSARTAN POTASSIUM-HCTZ 50-12.5 MG PO TABS: 1.0000 | ORAL_TABLET | Freq: Every morning | ORAL | Status: DC

## 2012-04-23 MED ORDER — SODIUM CHLORIDE 0.9 % IV SOLN: INTRAVENOUS | Status: DC

## 2012-04-23 MED ORDER — ACETAMINOPHEN 325 MG PO TABS: 650.0000 mg | ORAL_TABLET | Freq: Four times a day (QID) | ORAL | Status: DC | PRN

## 2012-04-23 MED ORDER — 0.9 % SODIUM CHLORIDE (POUR BTL) OPTIME
TOPICAL | Status: DC | PRN
  Administered 2012-04-23: 1000 mL

## 2012-04-23 MED ORDER — DILTIAZEM HCL ER 240 MG PO CP24
240.0000 mg | ORAL_CAPSULE | Freq: Every morning | ORAL | Status: DC
  Administered 2012-04-24 – 2012-04-26 (×3): 240 mg via ORAL
  Filled 2012-04-23 (×3): qty 1

## 2012-04-23 MED ORDER — ACETAMINOPHEN 10 MG/ML IV SOLN
1000.0000 mg | Freq: Four times a day (QID) | INTRAVENOUS | Status: AC
  Administered 2012-04-23 – 2012-04-24 (×4): 1000 mg via INTRAVENOUS
  Filled 2012-04-23 (×6): qty 100

## 2012-04-23 MED ORDER — ONDANSETRON HCL 4 MG PO TABS: 4.0000 mg | ORAL_TABLET | Freq: Four times a day (QID) | ORAL | Status: DC | PRN

## 2012-04-23 MED ORDER — BISACODYL 10 MG RE SUPP
10.0000 mg | Freq: Every day | RECTAL | Status: DC | PRN
Start: 2012-04-23 — End: 2012-04-26
  Administered 2012-04-26: 10 mg via RECTAL
  Filled 2012-04-23: qty 1

## 2012-04-23 MED ORDER — DIPHENHYDRAMINE HCL 12.5 MG/5ML PO ELIX: 12.5000 mg | ORAL_SOLUTION | ORAL | Status: DC | PRN

## 2012-04-23 MED ORDER — OXYCODONE HCL 5 MG PO TABS
5.0000 mg | ORAL_TABLET | ORAL | Status: DC | PRN
  Administered 2012-04-24 (×4): 10 mg via ORAL
  Administered 2012-04-25: 5 mg via ORAL
  Administered 2012-04-25 (×2): 10 mg via ORAL
  Administered 2012-04-26 (×2): 5 mg via ORAL
  Filled 2012-04-23 (×2): qty 1
  Filled 2012-04-23: qty 2
  Filled 2012-04-23: qty 1
  Filled 2012-04-23 (×5): qty 2
  Filled 2012-04-23: qty 1

## 2012-04-23 MED ORDER — HYDROMORPHONE HCL PF 1 MG/ML IJ SOLN
0.2500 mg | INTRAMUSCULAR | Status: DC | PRN
  Administered 2012-04-23 (×4): 0.5 mg via INTRAVENOUS

## 2012-04-23 MED ORDER — METHOCARBAMOL 100 MG/ML IJ SOLN
500.0000 mg | Freq: Four times a day (QID) | INTRAVENOUS | Status: DC | PRN
  Administered 2012-04-23: 500 mg via INTRAVENOUS
  Filled 2012-04-23: qty 5

## 2012-04-23 MED ORDER — GLYCOPYRROLATE 0.2 MG/ML IJ SOLN
INTRAMUSCULAR | Status: DC | PRN
  Administered 2012-04-23: 0.6 mg via INTRAVENOUS

## 2012-04-23 MED ORDER — HYDROMORPHONE HCL PF 1 MG/ML IJ SOLN
INTRAMUSCULAR | Status: AC
  Filled 2012-04-23: qty 1

## 2012-04-23 MED ORDER — POLYETHYLENE GLYCOL 3350 17 G PO PACK
17.0000 g | PACK | Freq: Every day | ORAL | Status: DC | PRN
  Administered 2012-04-24 – 2012-04-25 (×2): 17 g via ORAL

## 2012-04-23 MED ORDER — CEFAZOLIN SODIUM 1-5 GM-% IV SOLN
1.0000 g | Freq: Four times a day (QID) | INTRAVENOUS | Status: AC
  Administered 2012-04-23 (×2): 1 g via INTRAVENOUS
  Filled 2012-04-23 (×2): qty 50

## 2012-04-23 MED ORDER — NEOSTIGMINE METHYLSULFATE 1 MG/ML IJ SOLN
INTRAMUSCULAR | Status: DC | PRN
  Administered 2012-04-23: 5 mg via INTRAVENOUS

## 2012-04-23 MED ORDER — METOCLOPRAMIDE HCL 5 MG/ML IJ SOLN: 5.0000 mg | Freq: Three times a day (TID) | INTRAMUSCULAR | Status: DC | PRN

## 2012-04-23 MED ORDER — CEFAZOLIN SODIUM-DEXTROSE 2-3 GM-% IV SOLR
2.0000 g | INTRAVENOUS | Status: AC
  Administered 2012-04-23: 2 g via INTRAVENOUS

## 2012-04-23 MED ORDER — ACETAMINOPHEN 10 MG/ML IV SOLN
1000.0000 mg | Freq: Once | INTRAVENOUS | Status: AC
  Administered 2012-04-23: 1000 mg via INTRAVENOUS

## 2012-04-23 MED ORDER — EPHEDRINE SULFATE 50 MG/ML IJ SOLN
INTRAMUSCULAR | Status: DC | PRN
  Administered 2012-04-23: 10 mg via INTRAVENOUS
  Administered 2012-04-23: 5 mg via INTRAVENOUS

## 2012-04-23 MED ORDER — BUPIVACAINE LIPOSOME 1.3 % IJ SUSP
20.0000 mL | Freq: Once | INTRAMUSCULAR | Status: DC
  Filled 2012-04-23: qty 20

## 2012-04-23 SURGICAL SUPPLY — 57 items
BAG SPEC THK2 15X12 ZIP CLS (MISCELLANEOUS) ×2
BAG ZIPLOCK 12X15 (MISCELLANEOUS) ×7 IMPLANT
BIT DRILL 2.8X128 (BIT) ×3 IMPLANT
BLADE EXTENDED COATED 6.5IN (ELECTRODE) ×3 IMPLANT
BLADE SAW SGTL 18X1.27X75 (BLADE) ×3 IMPLANT
CLOTH BEACON ORANGE TIMEOUT ST (SAFETY) ×3 IMPLANT
CLSR STERI-STRIP ANTIMIC 1/2X4 (GAUZE/BANDAGES/DRESSINGS) ×10 IMPLANT
CONT SPECI 4OZ STER CLIK (MISCELLANEOUS) ×3 IMPLANT
DECANTER SPIKE VIAL GLASS SM (MISCELLANEOUS) ×6 IMPLANT
DRAPE C-ARM 42X72 X-RAY (DRAPES) ×3 IMPLANT
DRAPE INCISE IOBAN 66X45 STRL (DRAPES) ×3 IMPLANT
DRAPE ORTHO SPLIT 77X108 STRL (DRAPES) ×6
DRAPE POUCH INSTRU U-SHP 10X18 (DRAPES) ×3 IMPLANT
DRAPE STERI IOBAN 125X83 (DRAPES) ×3 IMPLANT
DRAPE SURG ORHT 6 SPLT 77X108 (DRAPES) ×4 IMPLANT
DRAPE U-SHAPE 47X51 STRL (DRAPES) ×12 IMPLANT
DRSG ADAPTIC 3X8 NADH LF (GAUZE/BANDAGES/DRESSINGS) ×6 IMPLANT
DRSG MEPILEX BORDER 4X4 (GAUZE/BANDAGES/DRESSINGS) ×6 IMPLANT
DRSG MEPILEX BORDER 4X8 (GAUZE/BANDAGES/DRESSINGS) ×6 IMPLANT
DURAPREP 26ML APPLICATOR (WOUND CARE) ×6 IMPLANT
ELECT REM PT RETURN 9FT ADLT (ELECTROSURGICAL) ×6
ELECTRODE REM PT RTRN 9FT ADLT (ELECTROSURGICAL) ×4 IMPLANT
EVACUATOR 1/8 PVC DRAIN (DRAIN) ×6 IMPLANT
FACESHIELD LNG OPTICON STERILE (SAFETY) ×22 IMPLANT
GLOVE BIO SURGEON STRL SZ8 (GLOVE) ×6 IMPLANT
GLOVE BIOGEL PI IND STRL 8 (GLOVE) ×4 IMPLANT
GLOVE BIOGEL PI INDICATOR 8 (GLOVE) ×4
GLOVE ECLIPSE 8.0 STRL XLNG CF (GLOVE) ×4 IMPLANT
GOWN STRL NON-REIN LRG LVL3 (GOWN DISPOSABLE) ×8 IMPLANT
GOWN STRL REIN XL XLG (GOWN DISPOSABLE) ×5 IMPLANT
IMMOBILIZER KNEE 20 (SOFTGOODS) ×3
IMMOBILIZER KNEE 20 THIGH 36 (SOFTGOODS) IMPLANT
KIT BASIN OR (CUSTOM PROCEDURE TRAY) ×3 IMPLANT
MANIFOLD NEPTUNE II (INSTRUMENTS) ×3 IMPLANT
NDL SAFETY ECLIPSE 18X1.5 (NEEDLE) ×4 IMPLANT
NEEDLE HYPO 18GX1.5 SHARP (NEEDLE) ×6
NS IRRIG 1000ML POUR BTL (IV SOLUTION) ×3 IMPLANT
PACK TOTAL JOINT (CUSTOM PROCEDURE TRAY) ×4 IMPLANT
PACK UNIVERSAL I (CUSTOM PROCEDURE TRAY) ×3 IMPLANT
PADDING CAST COTTON 6X4 STRL (CAST SUPPLIES) ×3 IMPLANT
PASSER SUT SWANSON 36MM LOOP (INSTRUMENTS) ×3 IMPLANT
POSITIONER SURGICAL ARM (MISCELLANEOUS) ×3 IMPLANT
SPONGE GAUZE 4X4 12PLY (GAUZE/BANDAGES/DRESSINGS) ×6 IMPLANT
SUCTION FRAZIER 12FR DISP (SUCTIONS) ×6 IMPLANT
SUT ETHIBOND NAB CT1 #1 30IN (SUTURE) ×15 IMPLANT
SUT MNCRL AB 4-0 PS2 18 (SUTURE) ×6 IMPLANT
SUT VIC AB 1 CT1 27 (SUTURE) ×3
SUT VIC AB 1 CT1 27XBRD ANTBC (SUTURE) IMPLANT
SUT VIC AB 2-0 CT1 27 (SUTURE) ×12
SUT VIC AB 2-0 CT1 TAPERPNT 27 (SUTURE) ×6 IMPLANT
SUT VLOC 180 0 24IN GS25 (SUTURE) ×6 IMPLANT
SWAB COLLECTION DEVICE MRSA (MISCELLANEOUS) ×3 IMPLANT
SYR 50ML LL SCALE MARK (SYRINGE) ×5 IMPLANT
TOWEL OR 17X26 10 PK STRL BLUE (TOWEL DISPOSABLE) ×12 IMPLANT
TRAY FOLEY CATH 14FRSI W/METER (CATHETERS) ×3 IMPLANT
TUBE ANAEROBIC SPECIMEN COL (MISCELLANEOUS) ×1 IMPLANT
WATER STERILE IRR 1500ML POUR (IV SOLUTION) ×4 IMPLANT

## 2012-04-23 NOTE — Anesthesia Preprocedure Evaluation (Addendum)
Anesthesia Evaluation  Patient identified by MRN, date of birth, ID band Patient awake    Reviewed: Allergy & Precautions, H&P , NPO status , Patient's Chart, lab work & pertinent test results  History of Anesthesia Complications (+) PONV  Airway Mallampati: I TM Distance: >3 FB Neck ROM: Full    Dental  (+) Dental Advisory Given and Teeth Intact   Pulmonary neg pulmonary ROS,  breath sounds clear to auscultation  Pulmonary exam normal       Cardiovascular hypertension, Pt. on medications - Past MI and - CHF Rhythm:Regular Rate:Normal     Neuro/Psych negative neurological ROS  negative psych ROS   GI/Hepatic negative GI ROS, Neg liver ROS,   Endo/Other  negative endocrine ROS  Renal/GU negative Renal ROS     Musculoskeletal negative musculoskeletal ROS (+) Arthritis -, Osteoarthritis,    Abdominal   Peds  Hematology negative hematology ROS (+)   Anesthesia Other Findings   Reproductive/Obstetrics                          Anesthesia Physical Anesthesia Plan  ASA: II  Anesthesia Plan: General   Post-op Pain Management:    Induction: Intravenous  Airway Management Planned: Oral ETT  Additional Equipment:   Intra-op Plan:   Post-operative Plan: Extubation in OR  Informed Consent: I have reviewed the patients History and Physical, chart, labs and discussed the procedure including the risks, benefits and alternatives for the proposed anesthesia with the patient or authorized representative who has indicated his/her understanding and acceptance.   Dental advisory given  Plan Discussed with: CRNA  Anesthesia Plan Comments:         Anesthesia Quick Evaluation

## 2012-04-23 NOTE — Transfer of Care (Signed)
Immediate Anesthesia Transfer of Care Note  Patient: Desiree Pittman  Procedure(s) Performed: Procedure(s) (LRB): TOTAL HIP ARTHROPLASTY ANTERIOR APPROACH (Right) TOTAL HIP REVISION (Left)  Patient Location: PACU  Anesthesia Type: General  Level of Consciousness: sedated, patient cooperative and responds to stimulaton  Airway & Oxygen Therapy: Patient Spontanous Breathing and Patient connected to face mask oxgen  Post-op Assessment: Report given to PACU RN and Post -op Vital signs reviewed and stable  Post vital signs: Reviewed and stable  Complications: No apparent anesthesia complications

## 2012-04-23 NOTE — Preoperative (Signed)
Beta Blockers   Reason not to administer Beta Blockers:Not Applicable 

## 2012-04-23 NOTE — Brief Op Note (Signed)
04/23/2012  3:25 PM  PATIENT:  Desiree Pittman  61 y.o. female  PRE-OPERATIVE DIAGNOSIS:  Failed Left Total Hip Arthroplasty/Osteoarthritis of the Right Hip  POST-OPERATIVE DIAGNOSIS:  Failed Left Total Hip Arthroplasty/OA Right hip  PROCEDURE:  Procedure(s) (LRB) with comments: TOTAL HIP ARTHROPLASTY ANTERIOR APPROACH (Right) TOTAL HIP REVISION (Left)  SURGEON:  Surgeon(s) and Role:    * Loanne Drilling, MD - Primary  PHYSICIAN ASSISTANT:   ASSISTANTS: Leilani Able, PA-C    ANESTHESIA:   general  EBL:  Total I/O In: 2000 [I.V.:2000] Out: 1550 [Urine:1000; Blood:550]  BLOOD ADMINISTERED:none  LOCAL MEDICATIONS USED:  OTHER Exparel 10ml each side    TOURNIQUET:  * No tourniquets in log *  DICTATION: .Other Dictation: Dictation Number [id number]  PLAN OF CARE: Admit to inpatient   PATIENT DISPOSITION:  PACU - hemodynamically stable.

## 2012-04-23 NOTE — H&P (View-Only) (Signed)
Desiree Pittman  DOB: 05-17-1950 Married / Language: English / Race: White Female  Date of Admission:  04/23/2012  Chief complaint: Bilateral hip pain  History of Present Illness The patient is a 61 year old female who comes in today for a preoperative History and Physical. The patient is scheduled for a right total hip arthroplasty and a left hip bearing exchange versus left total hip revision to be performed by Dr. Gus Rankin. Aluisio, MD at [facility] on 04/23/2012. The patient is a 61 year old female who presented with a hip problems. The patient reports left lateral hip and right anterior hip problems including pain. 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 Her left total hip, which she had done in 2008 gave her several episodes of clunking and popping. She interpreted these as dislocations. She never had to go to the hospital for this. Her husband would just kind of pick her up and let her leg dangle and it would feel like it went back in. Over the last year she has not had any further episodes of that, but this past week had an episode of it while working out in the garden. Again, her husband just picked her up and let her hang her leg and it felt like it went back into place. She does not have much pain at rest, but with activities she has pain and it gives way and feels like it catches intermittently. No fevers, constitutional symptoms. She got a MARS MRI of her left hip to look for both effusion and impingement and will also get metallosis labs. The MRI scan was reviews. She definitely has a large fluid collection. There was concerned she may have a sensitivity to the metal. It wasn't felt that she had metallosis because her iron levels are negligible. Iy rect was recommended that she revise the bearing surface and probably have to revise the cuff because of the low abduction angle. She might be getting some  impingement. We discussed all this in detail. She wants to proceed with the surgery. Her right hip has just been giving her achy discomfort in the anterior groin with activities.The patient is being followed for their right hip pain. They are several months out from intra articular She wuold like to go ahead and get the right hip replaced at the same time if all possible. They have been treated conservatively in the past for the above stated problem and despite conservative measures, they continue to have progressive pain and severe functional limitations and dysfunction. They have failed non-operative management including home exercise, medications, and injections. It is felt that they would benefit from undergoing total joint replacement. Risks and benefits of the procedure have been discussed with the patient and they elect to proceed with surgery. There are no active contraindications to surgery such as ongoing infection or rapidly progressive neurological disease.     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 12/13/2006   Allergies No Known Drug Allergies.   Family History Cerebrovascular Accident. mother Hypertension. mother Osteoarthritis. mother and grandmother mothers side Cancer. father and grandfather mothers side   Social History Living situation. live with spouse Marital status. married Number of flights of stairs before winded. 2-3 Drug/Alcohol Rehab (Previously). no Exercise. Exercises monthly; does running / walking and other Illicit drug use. no Pain Contract. no Tobacco use. never smoker Children. 2 Current work status. working full time Drug/Alcohol Rehab (Currently).  no Alcohol use. current drinker; drinks wine; 8-14 per week   Medication History Diltiazem CD (240MG  Capsule ER 24HR, Oral) Active. Losartan Potassium-HCTZ ( Oral) Specific dose unknown - Active. Norco (5-325MG   Tablet, 1-2 Tablet Oral po qhs prn pain, Taken starting 04/15/2012) Active. (called to [facility] [id number]) Mobic (7.5MG /5ML Suspension, Oral) Active. TraMADol HCl (50MG  Tablet, Oral) Active. (prn) Aspirin EC Low Strength (81MG  Tablet DR, Oral) Active.   Pregnancy / Birth History Pregnant. no   Past Surgical History Carpal Tunnel Repair. bilateral Total Hip Replacement. left Tendon/Nerve/Artery Repair Left Finger   Medical History High blood pressure Osteoarthritis Hemorrhoids Menopause   Review of Systems General:Not Present- Chills, Fever, Night Sweats, Fatigue, Weight Gain, Weight Loss and Memory Loss. Skin:Not Present- Hives, Itching, Rash, Eczema and Lesions. HEENT:Not Present- Tinnitus, Headache, Double Vision, Visual Loss, Hearing Loss and Dentures. Respiratory:Not Present- Shortness of breath with exertion, Shortness of breath at rest, Allergies, Coughing up blood and Chronic Cough. Cardiovascular:Not Present- Chest Pain, Racing/skipping heartbeats, Difficulty Breathing Lying Down, Murmur, Swelling and Palpitations. Gastrointestinal:Not Present- Bloody Stool, Heartburn, Abdominal Pain, Vomiting, Nausea, Constipation, Diarrhea, Difficulty Swallowing, Jaundice and Loss of appetitie. Female Genitourinary:Not Present- Blood in Urine, Urinary frequency, Weak urinary stream, Discharge, Flank Pain, Incontinence, Painful Urination, Urgency, Urinary Retention and Urinating at Night. Musculoskeletal:Present- Joint Swelling, Joint Pain and Back Pain. Not Present- Muscle Weakness, Muscle Pain, Morning Stiffness and Spasms. Neurological:Not Present- Tremor, Dizziness, Blackout spells, Paralysis, Difficulty with balance and Weakness. Psychiatric:Not Present- Insomnia.   Vitals Weight: 142 lb Height: 63 in Weight was reported by patient. Height was reported by patient. Body Surface Area: 1.69 m Body Mass Index: 25.15 kg/m Pulse: 84 (Regular) Resp.: 16  (Unlabored) BP: 134/78 (Sitting, Left Arm, Standard)    Physical Exam The physical exam findings are as follows:   General Mental Status - Alert, cooperative and good historian. General Appearance- pleasant. Not in acute distress. Orientation- Oriented X3. Build & Nutrition- Well nourished and Well developed.   Head and Neck Head- normocephalic, atraumatic . Neck Global Assessment- supple. no bruit auscultated on the right and no bruit auscultated on the left.   Eye Pupil- Bilateral- Regular and Round. Motion- Bilateral- EOMI.   Chest and Lung Exam Auscultation: Breath sounds:- clear at anterior chest wall and - clear at posterior chest wall. Adventitious sounds:- No Adventitious sounds.   Cardiovascular Auscultation:Rhythm- Regular rate and rhythm. Heart Sounds- S1 WNL and S2 WNL. Murmurs & Other Heart Sounds:Auscultation of the heart reveals - No Murmurs.   Abdomen Palpation/Percussion:Tenderness- Abdomen is non-tender to palpation. Rigidity (guarding)- Abdomen is soft. Auscultation:Auscultation of the abdomen reveals - Bowel sounds normal.   Female Genitourinary Not done, not pertinent to present illness  Musculoskeletal Well developed female, alert and oriented in no apparent distress. Right hip flexion is to about 100, minimal internal rotation, about 10 external rotation, 30 abduction. Left hip flexion is to 110, rotation in 30, out 40, abduction 40 without discomfort. I reviewed her plain radiographs from back around April. The prosthesis is in good position. She has metal on metal. Cup is slightly horizontal, but the abduction angle is in the 40's. She does not have any osteolysis. No bone loss. Right hip has bone on bone arthritis.  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  Plan is for home.  Signed electronically by Roberts Gaudy, PA-C

## 2012-04-23 NOTE — Anesthesia Postprocedure Evaluation (Signed)
Anesthesia Post Note  Patient: Desiree Pittman  Procedure(s) Performed: Procedure(s) (LRB): TOTAL HIP ARTHROPLASTY ANTERIOR APPROACH (Right) TOTAL HIP REVISION (Left)  Anesthesia type: General  Patient location: PACU  Post pain: Pain level controlled  Post assessment: Post-op Vital signs reviewed  Last Vitals: BP 131/65  Pulse 82  Temp 36.6 C  Resp 18  SpO2 100%  Post vital signs: Reviewed  Level of consciousness: sedated  Complications: No apparent anesthesia complications

## 2012-04-23 NOTE — Interval H&P Note (Signed)
History and Physical Interval Note:  04/23/2012 10:55 AM  Desiree Pittman  has presented today for surgery, with the diagnosis of Failed Left Total Hip Arthroplasty/Osteoarthritis of the Right Hip  The various methods of treatment have been discussed with the patient and family. After consideration of risks, benefits and other options for treatment, the patient has consented to  Procedure(s) (LRB) with comments: TOTAL HIP ARTHROPLASTY ANTERIOR APPROACH (Right) - Right Total Hip Arthroplasty, Anterior Approach C-Arm TOTAL HIP REVISION (Left) - Left Hip Bearing Revision vs Total Hip Arthroplasty Revision as a surgical intervention .  The patient's history has been reviewed, patient examined, no change in status, stable for surgery.  I have reviewed the patient's chart and labs.  Questions were answered to the patient's satisfaction.     Loanne Drilling

## 2012-04-24 ENCOUNTER — Encounter (HOSPITAL_COMMUNITY): Payer: Self-pay | Admitting: *Deleted

## 2012-04-24 DIAGNOSIS — D62 Acute posthemorrhagic anemia: Secondary | ICD-10-CM | POA: Diagnosis not present

## 2012-04-24 DIAGNOSIS — E871 Hypo-osmolality and hyponatremia: Secondary | ICD-10-CM | POA: Diagnosis not present

## 2012-04-24 LAB — CBC
HCT: 28.9 % — ABNORMAL LOW (ref 36.0–46.0)
MCH: 32.7 pg (ref 26.0–34.0)
MCHC: 34.3 g/dL (ref 30.0–36.0)
RDW: 13.3 % (ref 11.5–15.5)

## 2012-04-24 LAB — TYPE AND SCREEN: Antibody Screen: NEGATIVE

## 2012-04-24 LAB — BASIC METABOLIC PANEL
BUN: 8 mg/dL (ref 6–23)
Calcium: 8.8 mg/dL (ref 8.4–10.5)
GFR calc Af Amer: >90 mL/min (ref >90)
GFR calc non Af Amer: >90 mL/min (ref >90)
Potassium: 4 mEq/L (ref 3.5–5.1)
Sodium: 133 mEq/L — ABNORMAL LOW (ref 135–145)

## 2012-04-24 MED ORDER — POLYSACCHARIDE IRON COMPLEX 150 MG PO CAPS
150.0000 mg | ORAL_CAPSULE | Freq: Every day | ORAL | Status: DC
  Administered 2012-04-24 – 2012-04-26 (×3): 150 mg via ORAL
  Filled 2012-04-24 (×3): qty 1

## 2012-04-24 MED ORDER — ALUM & MAG HYDROXIDE-SIMETH 200-200-20 MG/5ML PO SUSP
30.0000 mL | ORAL | Status: DC | PRN
  Administered 2012-04-24: 30 mL via ORAL
  Filled 2012-04-24: qty 30

## 2012-04-24 NOTE — Progress Notes (Signed)
Utilization review completed.  

## 2012-04-24 NOTE — Progress Notes (Signed)
   Subjective: 1 Day Post-Op Procedure(s) (LRB): TOTAL HIP ARTHROPLASTY ANTERIOR APPROACH (Right) TOTAL HIP REVISION (Left) Patient reports pain as mild and moderate.   Patient seen in rounds by Dr. Lequita Halt. Patient is well, but has had some minor complaints of pain in the hips, requiring pain medications We will start therapy today.  Plan is to go Home after hospital stay.  Objective: Vital signs in last 24 hours: Temp:  [97.4 F (36.3 C)-98.7 F (37.1 C)] 98.7 F (37.1 C) (12/12 0512) Pulse Rate:  [61-87] 70  (12/12 0512) Resp:  [10-20] 14  (12/12 0512) BP: (91-155)/(45-89) 126/71 mmHg (12/12 0512) SpO2:  [98 %-100 %] 100 % (12/12 0512) Weight:  [65.1 kg (143 lb 8.3 oz)] 65.1 kg (143 lb 8.3 oz) (12/11 1800)  Intake/Output from previous day:  Intake/Output Summary (Last 24 hours) at 04/24/12 0825 Last data filed at 04/24/12 0512  Gross per 24 hour  Intake   3835 ml  Output   4490 ml  Net   -655 ml    Intake/Output this shift: UOP 1450 since MN -655  Labs:  Basename 04/24/12 0405  HGB 9.9*    Basename 04/24/12 0405  WBC 10.0  RBC 3.03*  HCT 28.9*  PLT 183    Basename 04/24/12 0405  NA 133*  K 4.0  CL 100  CO2 23  BUN 8  CREATININE 0.51  GLUCOSE 170*  CALCIUM 8.8   No results found for this basename: LABPT:2,INR:2 in the last 72 hours  EXAM General - Patient is Alert, Appropriate and Oriented Extremity - Neurovascular intact Sensation intact distally Dorsiflexion/Plantar flexion intact Dressing - dressing C/D/I Motor Function - intact, moving foot and toes well on exam.  Hemovac pulled without difficulty.  Past Medical History  Diagnosis Date  . PONV (postoperative nausea and vomiting)     pull tubes out,etc.  . Hypertension   . Arthritis     Assessment/Plan: 1 Day Post-Op Procedure(s) (LRB): TOTAL HIP ARTHROPLASTY ANTERIOR APPROACH (Right) TOTAL HIP REVISION (Left) Principal Problem:  *OA (osteoarthritis) of hip Active Problems:  Postop Acute blood loss anemia  Postop Hyponatremia  Estimated Body mass index is 25.42 kg/(m^2) as calculated from the following:   Height as of this encounter: 5\' 3" (1.6 m).   Weight as of this encounter: 143 lb 8.3 oz(65.1 kg). Advance diet Up with therapy Discharge home with home health  DVT Prophylaxis - Xarelto, ASA 81 mg on hold Weight Bearing As Tolerated both Legs D/C Knee Immobilizers Hemovacs Pulled Begin Therapy Hip Preacutions on the Left Revision Hip  No vaccines.  Adilynn Bessey 04/24/2012, 8:25 AM

## 2012-04-24 NOTE — Evaluation (Signed)
Physical Therapy Evaluation Patient Details Name: Arasely Akkerman MRN: [id number] DOB: [birth year] Today's Date: 04/24/2012 Time: 4098-1191 PT Time Calculation (min): 38 min  PT Assessment / Plan / Recommendation Clinical Impression  Pt s/p R DA THR and L THR revision presents with decreased strength/ROM Bil LEs, post op discomfort andL post THP limiting functional mobility    PT Assessment  Patient needs continued PT services    Follow Up Recommendations  Home health PT    Does the patient have the potential to tolerate intense rehabilitation      Barriers to Discharge None      Equipment Recommendations  None recommended by PT    Recommendations for Other Services OT consult   Frequency 7X/week    Precautions / Restrictions Precautions Precautions: Posterior Hip Precaution Booklet Issued: Yes (comment) Precaution Comments: posterior hip precautions on L LE; none on R LE; reviewed all hip precautions with pt Restrictions Weight Bearing Restrictions: No RLE Weight Bearing: Weight bearing as tolerated LLE Weight Bearing: Weight bearing as tolerated   Pertinent Vitals/Pain Min c/o pain      Mobility  Bed Mobility Bed Mobility: Supine to Sit Supine to Sit: 4: Min assist Details for Bed Mobility Assistance: cues for THP and use of UEs and R LE to self assist Transfers Transfers: Sit to Stand;Stand to Sit Sit to Stand: 4: Min assist;With upper extremity assist;From chair/3-in-1;From toilet Stand to Sit: 4: Min assist;With upper extremity assist;To chair/3-in-1;To toilet Details for Transfer Assistance: min vebal cues for hand placement and L LE management. Ambulation/Gait Ambulation/Gait Assistance: 4: Min assist Ambulation Distance (Feet): 99 Feet Assistive device: Rolling walker Ambulation/Gait Assistance Details: cues for posture, position from RW, stride length and ER on L Gait Pattern: Step-to pattern    Shoulder Instructions     Exercises Total Joint  Exercises Ankle Circles/Pumps: AROM;15 reps;Supine;Both Quad Sets: AROM;Both;10 reps;Supine Heel Slides: AAROM;15 reps;Supine;Both Hip ABduction/ADduction: AAROM;15 reps;Both;Supine   PT Diagnosis: Difficulty walking  PT Problem List: Decreased strength;Decreased range of motion;Decreased activity tolerance;Decreased mobility;Decreased knowledge of use of DME;Pain;Decreased knowledge of precautions PT Treatment Interventions: DME instruction;Gait training;Stair training;Functional mobility training;Therapeutic activities;Therapeutic exercise;Patient/family education   PT Goals Acute Rehab PT Goals PT Goal Formulation: With patient Time For Goal Achievement: 04/28/12 Potential to Achieve Goals: Good Pt will go Supine/Side to Sit: with supervision PT Goal: Supine/Side to Sit - Progress: Goal set today Pt will go Sit to Supine/Side: with supervision PT Goal: Sit to Supine/Side - Progress: Goal set today Pt will go Sit to Stand: with supervision PT Goal: Sit to Stand - Progress: Goal set today Pt will go Stand to Sit: with supervision PT Goal: Stand to Sit - Progress: Goal set today Pt will Ambulate: >150 feet PT Goal: Ambulate - Progress: Goal set today Pt will Go Up / Down Stairs: 1-2 stairs;with supervision;with least restrictive assistive device PT Goal: Up/Down Stairs - Progress: Goal set today  Visit Information  Last PT Received On: 04/24/12 Assistance Needed: +1    Subjective Data  Subjective: I am looking forward to having two good hips Patient Stated Goal: Resume previous active lifestyle   Prior Functioning  Home Living Lives With: Spouse Available Help at Discharge: Family Type of Home: House Home Access: Level entry;Ramped entrance Home Layout: One level Bathroom Shower/Tub: Walk-in shower;Other (comment) (grab bar in shower) Bathroom Toilet: Handicapped height Home Adaptive Equipment: Grab bars in shower;Bedside commode/3-in-1;Shower chair with back;Long-handled  sponge;Long-handled shoehorn;Reacher Prior Function Level of Independence: Independent Able to Take Stairs?: Yes Driving:  Yes Vocation: Full time employment Communication Communication: No difficulties Dominant Hand: Right    Cognition  Overall Cognitive Status: Appears within functional limits for tasks assessed/performed Arousal/Alertness: Awake/alert Orientation Level: Appears intact for tasks assessed Behavior During Session: [facility] for tasks performed    Extremity/Trunk Assessment Right Upper Extremity Assessment RUE ROM/Strength/Tone: [facility] for tasks assessed Left Upper Extremity Assessment LUE ROM/Strength/Tone: Deficits LUE ROM/Strength/Tone Deficits: note edema in L 4th digit. pt reports tendon repair in Sept of this year. not able to fully grasp with L hand.  Right Lower Extremity Assessment RLE ROM/Strength/Tone: Deficits RLE ROM/Strength/Tone Deficits: Hip strength 3-/5; AAROM at hip to 90 flex and 20 abd Left Lower Extremity Assessment LLE ROM/Strength/Tone: Deficits LLE ROM/Strength/Tone Deficits: hip strength 2+/5 with AAROM to 80 flex and 25 abd   Balance    End of Session PT - End of Session Equipment Utilized During Treatment: Gait belt Activity Tolerance: Patient tolerated treatment well Patient left: in chair;with call bell/phone within reach;with family/visitor present Nurse Communication: Mobility status  GP     Emmerie Battaglia 04/24/2012, 1:16 PM

## 2012-04-24 NOTE — Evaluation (Signed)
Occupational Therapy Evaluation Patient Details Name: Desiree Pittman MRN: [id number] DOB: 1950-12-12 Today's Date: 04/24/2012 Time: 4098-1191 OT Time Calculation (min): 29 min  OT Assessment / Plan / Recommendation Clinical Impression  Pt is s/p TOTAL HIP ARTHROPLASTY ANTERIOR APPROACH (Right) and TOTAL HIP REVISION (Left) and displays some pain with activity, decreased strength and independence with ADL. Will benefit from skilled OT services to improve ADL safety and independence.     OT Assessment  Patient needs continued OT Services    Follow Up Recommendations  No OT follow up;Supervision/Assistance - 24 hour    Barriers to Discharge      Equipment Recommendations  None recommended by OT    Recommendations for Other Services    Frequency  Min 2X/week    Precautions / Restrictions Precautions Precautions: Posterior Hip Precaution Booklet Issued: Yes (comment) Precaution Comments: posterior hip precautions on L LE; none on R LE; reviewed all hip precautions with pt Restrictions Weight Bearing Restrictions: No RLE Weight Bearing: Weight bearing as tolerated LLE Weight Bearing: Weight bearing as tolerated        ADL  Eating/Feeding: Simulated;Independent Where Assessed - Eating/Feeding: Chair Grooming: Simulated;Wash/dry face;Set up Where Assessed - Grooming: Supported sitting Upper Body Bathing: Simulated;Chest;Right arm;Left arm;Abdomen;Set up Where Assessed - Upper Body Bathing: Unsupported sitting Lower Body Bathing: Simulated;Moderate assistance;Other (comment) (without AE) Where Assessed - Lower Body Bathing: Supported sit to stand Upper Body Dressing: Simulated;Set up Where Assessed - Upper Body Dressing: Unsupported sitting Lower Body Dressing: Simulated;Moderate assistance;Other (comment) (without AE) Where Assessed - Lower Body Dressing: Supported sit to Pharmacist, hospital: Performed;Minimal Web designer: Comfort height  toilet;Grab bars Toileting - Architect and Hygiene: Simulated;Minimal assistance Where Assessed - Engineer, mining and Hygiene: Standing Tub/Shower Transfer Method: Not assessed Equipment Used: Rolling walker ADL Comments: Pt states she has most AE except sock aid and states husband will be assisting with LB ADL. She reports not needing to practice with AE at this time therefore. She has a handicapped height toilet with grab bar next to toilet at home but today had difficulty standing from toilet due to IV painful in L hand trying to push up with bar. She has had a tendon repair in 4th digit in Sept of this year  and note edema in that digit. Pt reports she has been having swelling in that finger but it is hard to do her exercises since IV is painful. Informed nursing. Discussed use of 3in1 initially at home to make functional transfers easier and safer and transition to handicapped height toilet. Explained benefit of armrests on 3in1 on each side rather than grab bar and vanity  alone.     OT Diagnosis: Generalized weakness  OT Problem List: Decreased strength;Decreased knowledge of use of DME or AE;Decreased knowledge of precautions;Pain OT Treatment Interventions: Self-care/ADL training;Therapeutic activities;DME and/or AE instruction;Patient/family education   OT Goals Acute Rehab OT Goals OT Goal Formulation: With patient Time For Goal Achievement: 05/01/12 Potential to Achieve Goals: Good ADL Goals Pt Will Perform Grooming: with supervision;Standing at sink ADL Goal: Grooming - Progress: Goal set today Pt Will Transfer to Toilet: with supervision;Ambulation;with DME;3-in-1 ADL Goal: Toilet Transfer - Progress: Goal set today Pt Will Perform Toileting - Clothing Manipulation: with supervision;Standing ADL Goal: Toileting - Clothing Manipulation - Progress: Goal set today Pt Will Perform Tub/Shower Transfer: with min assist;with DME;Shower seat with back ADL  Goal: Tub/Shower Transfer - Progress: Goal set today  Visit Information  Last OT Received On: 04/24/12  Assistance Needed: +1    Subjective Data  Subjective: this IV is bothering me in my left hand Patient Stated Goal: agreeable to get up with OT; none stated   Prior Functioning     Home Living Available Help at Discharge: Family Bathroom Shower/Tub: Walk-in shower;Other (comment) (grab bar in shower) Bathroom Toilet: Handicapped height Home Adaptive Equipment: Grab bars in shower;Bedside commode/3-in-1;Shower chair with back;Long-handled sponge;Long-handled shoehorn;Reacher Prior Function Level of Independence: Independent Communication Communication: No difficulties         Vision/Perception     Cognition  Overall Cognitive Status: Appears within functional limits for tasks assessed/performed Arousal/Alertness: Awake/alert Orientation Level: Appears intact for tasks assessed Behavior During Session: [facility] for tasks performed    Extremity/Trunk Assessment Right Upper Extremity Assessment RUE ROM/Strength/Tone: [facility] for tasks assessed Left Upper Extremity Assessment LUE ROM/Strength/Tone: Deficits LUE ROM/Strength/Tone Deficits: note edema in L 4th digit. pt reports tendon repair in Sept of this year. not able to fully grasp with L hand.      Mobility Transfers Transfers: Sit to Stand;Stand to Sit Sit to Stand: 4: Min assist;With upper extremity assist;From chair/3-in-1;From toilet Stand to Sit: 4: Min assist;With upper extremity assist;To chair/3-in-1;To toilet Details for Transfer Assistance: min vebal cues for hand placement and L LE management.     Shoulder Instructions     Exercise     Balance     End of Session OT - End of Session Activity Tolerance: Patient tolerated treatment well Patient left: in chair;with call bell/phone within reach;with family/visitor present  GO     Lennox Laity [phone] 04/24/2012, 11:50 AM

## 2012-04-24 NOTE — Op Note (Signed)
NAMEMarland Kitchen  NURIA, PHEBUS NO.:  [id number]  MEDICAL RECORD NO.:  [id number]  LOCATION:  1611                         FACILITY:  [facility]  PHYSICIAN:  Ollen Gross, M.D.    DATE OF BIRTH:  June 04, 1950  DATE OF PROCEDURE:  04/23/2012 DATE OF DISCHARGE:                              OPERATIVE REPORT   PREOPERATIVE DIAGNOSES: 1. Failed left total hip arthroplasty. 2. Osteoarthritis, right hip.  POSTOPERATIVE DIAGNOSES: 1. Failed left total hip arthroplasty. 2. Osteoarthritis, right hip.  PROCEDURES: 1. Left total hip arthroplasty revision. 2. Right total hip arthroplasty anterior approach.  SURGEON:  Ollen Gross, M.D.  ASSISTANT:  Jaquelyn Bitter. Chabon, P.A.  ANESTHESIA:  General.  ESTIMATED BLOOD LOSS:  Combined between the 2 procedures, 550 mL.  DRAINS:  Hemovac x1 each side.  COMPLICATIONS:  None.  CONDITION:  Stable to recovery.  BRIEF CLINICAL NOTE:  Lance is a 61 year old female who has severe end-stage osteoarthritis of her right hip and also had a left total hip arthroplasty done several years ago with a metal-on-metal construct. 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  She presents now for a primary right total hip arthroplasty and revision left total hip arthroplasty.  We discussed doing these procedures in separate settings, but she states that from an occupational standpoint and recovery standpoint, she would prefer to have both on the same time. We agreed that since the right was more acutely symptomatic, we would do the right side first and as long as that proceeded safely without excessive blood loss or excessive time, then we would be able to do the left side in the same setting.  Procedure risks, potential complications, and rehab course discussed with both and she elected to proceed.  PROCEDURE IN DETAIL:  After successful administration of  general anesthetic, the patient had both feet placed into the traction boots and she was placed onto the [facility] bed.  C-arm fluoroscopy was brought in to get an AP pelvis and AP of the hip for reference during the procedure. The hip and thigh were then isolated from the perineum with plastic drapes and prepped and draped in usual sterile fashion.  ASIS and greater trochanter are marked and then, incision line was drawn starting 2 cm lateral and 1 cm distal to the ASIS coursing towards the anterior cortex of the femur.  Skin was cut with a 10 blade through subcutaneous tissue to the fascia overlying the tensor fascia lata muscle.  That was incised with a knife.  Muscle fibers were teased off the fascia down in order to get into the intermuscular plane between the tensor fascia lata and the rectus femoris.  The rectus was identified and retracted anteriorly.  Tensor fascia lata retracted laterally.  The ascending branch of lateral femoral circumflex was identified and cauterized.  Pre- capsular fat was removed to gain access to the anterior hip capsule.  A L-shaped capsulotomy was then performed coursing the intertrochanteric line and heading anteriorly.  The femoral head was identified and the capsule edge was tagged.  An oscillating saw was then used to make a femoral neck osteotomy.  The  corkscrew was passed into the femoral head and the femoral head removed.  Acetabular retractors were placed.  Labrum and osteophytes are removed. Acetabular reaming starts at 45 with coursing increments of 2-49 mm.  A 50 mm pinnacle acetabular shell was partially impacted and under fluoro guidance, it was fully impacted in anatomic position.  It had excellent purchase and we did not need to place any additional dome screws.  The 32 mm neutral +4 marathon liner was then impacted into the shell and found to be in appropriate position and locked into place.  We then addressed the femur.  On the x-ray, it  appeared that the femoral neck cut was too long.  Thus, I freshened up the femoral neck cut down to an appropriate level.  Femoral hook was placed then we externally rotated the hip 90 degrees, applied traction with a hook, and adducted and extended the hip.  Finished releasing the capsule inferiorly and also released the capsule from around the posterior edge of the piriformis fossa.  The starter _________ reamer was then passed into the femoral canal and felt to be within the canal throughout.  We started broaching at a size 8 for the Corail stem.  We broached up to a size 9 stem which had excellent purchase.  I countersunk it a little bit in a calcar plane to get a level surface.  We then placed the trial neck in a 32+1 trial head.  The femoral hook was removed and gross traction applied to reduce the hip.  There was great stability and reduction.  No shock.  AP pelvis showed that the leg lengths are within 1-2 mm of being exactly equal.  I also liked the offset on the AP pelvis.  We then dislocated the hip and removed the trial.  The permanent size 9 Corail stem was impacted into the femoral canal with excellent purchase and excellent rotational and torsional stability.  A 32+1 ceramic head was placed and the hip was reduced with the same stability parameters. Wound was copiously irrigated with saline solution.  The capsule was repaired with #1 Ethibond suture.  The Exparel 10 mL mixed with 50 mL of saline was injected into the capsule of the tensor fascia lata, rectus femoris, and subcu tissues.  The fascia over the tensor fascia lata was then closed with a running #1 V-Loc suture.  A Hemovac drain had been placed prior to this.  Subcu was closed with interrupted 2-0 Vicryl and subcuticular running 4-0 Monocryl.  Incision was cleaned and dried and Steri-Strips and a sterile dressing applied.  The drain was then hooked to suction.  The patient was taken off [facility] bed and carefully  placed on the regular operating bed.  The patient was then placed in the right lateral decubitus position with the left side up and held with the hip positioner.  The left lower extremity was isolated from the perineum with plastic drapes and prepped and draped in usual sterile fashion. The operating surgeon and first assistant broke scrub, rescrubbed and then placed a new sterile gowns and gloves.  The patient's previous posterolateral incision was utilized and skin cut with a 10 blade through subcutaneous tissue to the fascia lata.  Fascia lata was incised and a metallosis appearing fluid was present.  It was sent for stat Gram stain, C and S, which was negative.  Capsular tissue was also sent for frozen section which showed no acute inflammation.  Capsulectomy was performed to remove the abnormal tissue.  Fortunately, there was no damage to the muscle and the abductors were intact.  I was able to dislocate the hip and removed the femoral head.  No gross stem showed femoral head or trunnion.  The femur was then retracted anteriorly and acetabular retractors were placed to gain acetabular exposure.  The acetabular liner, which was metal, was removed from the acetabular shell.  Shell was in good position, but behind the shell, there was metal debris.  One of the screw heads actually was destroyed by the metal debris.  We then used the Moreland osteotomes to circumferentially disrupt the interface between the acetabular component and bone.  The component was removed with no bone loss.  She had excellent acetabular bed with healthy-appearing cancellous bone.  We then were able to remove that broken screw from the superior acetabulum.  I started reaming at 45 mm coursing increments of 2-51 mm and a 52 mm pinnacle acetabular shell was placed in anatomic position and transfixed with 2 dome screws. There was excellent purchase with this cup.  The femoral stem was then inspected and there was a  16 x 11, 36 standard.  This was going to be too short.  We trialed with a 32+12 head and there was still a lot of soft tissue laxity.  I also did not like the position as it was in neutral version.  I subsequently disrupted the interface between the SROM stem and sleeve and easily removed the 16 x 11 S-ROM stem.  We replaced the 16 x 11 stem with 36 x 6 neck in about 20 degrees of anteversion to get the appropriate version to get an appropriate offset. The permanent 16 x 11 stem with a 36+6 neck was then impacted into the femur with 20 degrees of anteversion.  We placed a trial of 32+6 head and reduced it which had much better soft tissue tension, but she was still a few mm short, so we went to a 32+9 trial head which had great soft-tissue tension.  She had outstanding stability with full extension, full external rotation, 70 degrees flexion, 40 degrees adduction, 90 degrees internal rotation, 90 degrees of flexion, and 70 degrees of internal rotation.  By placing the left leg on top of the right, leg lengths were now equal.  Hip was then dislocated and a permanent 32+9 femoral head was placed onto the femoral neck.  Hip was reduced with the same stability parameters.  Wound was copiously irrigated with saline solution.  Tissues inspected and no other abnormal tissue was present. The posterior soft tissue structures were then reattached to the femur through drill holes with Ethibond suture.  Fascia lata was closed over Hemovac drain with a running #1 V-Loc.  Prior to doing this, 10 mL of Exparel mixed with 50 mL of saline was injected into the fascia lata and the subcu tissues.  The subcu was closed with interrupted 2-0 Vicryl and subcuticular running 4-0 Monocryl.  Drains hooked to suction.  Incision cleaned and dried, and Steri-Strips and a bulky sterile dressing applied.  The patient's was then extubated, awakened, and transported to recovery in stable condition.     Ollen Gross,  M.D.     FA/MEDQ  D:  04/23/2012  T:  04/24/2012  Job:  [id number]

## 2012-04-24 NOTE — Progress Notes (Signed)
Physical Therapy Treatment Patient Details Name: Desiree Pittman MRN: [id number] DOB: 25-Dec-1950 Today's Date: 04/24/2012 Time: 4098-1191 PT Time Calculation (min): 22 min  PT Assessment / Plan / Recommendation Comments on Treatment Session       Follow Up Recommendations  Home health PT     Does the patient have the potential to tolerate intense rehabilitation     Barriers to Discharge        Equipment Recommendations  None recommended by PT    Recommendations for Other Services OT consult  Frequency 7X/week   Plan Discharge plan remains appropriate    Precautions / Restrictions Precautions Precautions: Posterior Hip Precaution Booklet Issued: Yes (comment) Precaution Comments: posterior hip precautions on L LE; none on R LE; reviewed all hip precautions with pt Restrictions Weight Bearing Restrictions: No RLE Weight Bearing: Weight bearing as tolerated LLE Weight Bearing: Weight bearing as tolerated   Pertinent Vitals/Pain Min c/o pain Bil hips    Mobility  Bed Mobility Bed Mobility: Sit to Supine Sit to Supine: 4: Min assist Details for Bed Mobility Assistance: cues for THP and use of UEs and R LE to self assist - assist for BIl LEs (L >R) Transfers Transfers: Sit to Stand;Stand to Sit Sit to Stand: 4: Min guard Stand to Sit: 4: Min guard Details for Transfer Assistance: min vebal cues for hand placement and L LE management. Ambulation/Gait Ambulation/Gait Assistance: 4: Min guard Ambulation Distance (Feet): 179 Feet Assistive device: Rolling walker Ambulation/Gait Assistance Details: cues for posture, position from RW and ER on R Gait Pattern: Step-to pattern    Exercises     PT Diagnosis:    PT Problem List:   PT Treatment Interventions:     PT Goals Acute Rehab PT Goals PT Goal Formulation: With patient Time For Goal Achievement: 04/28/12 Potential to Achieve Goals: Good Pt will go Supine/Side to Sit: with supervision PT Goal: Supine/Side to Sit  - Progress: Progressing toward goal Pt will go Sit to Supine/Side: with supervision PT Goal: Sit to Supine/Side - Progress: Progressing toward goal Pt will go Sit to Stand: with supervision PT Goal: Sit to Stand - Progress: Progressing toward goal Pt will go Stand to Sit: with supervision PT Goal: Stand to Sit - Progress: Progressing toward goal Pt will Ambulate: >150 feet PT Goal: Ambulate - Progress: Progressing toward goal Pt will Go Up / Down Stairs: 1-2 stairs;with supervision;with least restrictive assistive device PT Goal: Up/Down Stairs - Progress: Goal set today  Visit Information  Last PT Received On: 04/24/12 Assistance Needed: +1    Subjective Data  Subjective: Doing good, min pain Patient Stated Goal: Resume previous active lifestyle   Cognition  Overall Cognitive Status: Appears within functional limits for tasks assessed/performed Arousal/Alertness: Awake/alert Orientation Level: Appears intact for tasks assessed Behavior During Session: [facility] for tasks performed    Balance     End of Session PT - End of Session Activity Tolerance: Patient tolerated treatment well Patient left: in bed;with call bell/phone within reach;with family/visitor present Nurse Communication: Mobility status   GP     Mckay Tegtmeyer 04/24/2012, 2:50 PM

## 2012-04-25 LAB — BASIC METABOLIC PANEL
Calcium: 9.4 mg/dL (ref 8.4–10.5)
Creatinine, Ser: 0.56 mg/dL (ref 0.50–1.10)
GFR calc Af Amer: >90 mL/min (ref >90)
GFR calc non Af Amer: >90 mL/min (ref >90)
Sodium: 139 mEq/L (ref 135–145)

## 2012-04-25 LAB — CBC
MCH: 32.1 pg (ref 26.0–34.0)
MCV: 97.6 fL (ref 78.0–100.0)
Platelets: 184 10*3/uL (ref 150–400)
RBC: 2.87 MIL/uL — ABNORMAL LOW (ref 3.87–5.11)
RDW: 13.9 % (ref 11.5–15.5)

## 2012-04-25 MED ORDER — TRAMADOL HCL 50 MG PO TABS: 50.0000 mg | ORAL_TABLET | Freq: Four times a day (QID) | ORAL | Status: DC | PRN

## 2012-04-25 MED ORDER — METHOCARBAMOL 500 MG PO TABS: 500.0000 mg | ORAL_TABLET | Freq: Four times a day (QID) | ORAL | Status: DC | PRN

## 2012-04-25 MED ORDER — RIVAROXABAN 10 MG PO TABS: 10.0000 mg | ORAL_TABLET | Freq: Every day | ORAL | Status: DC

## 2012-04-25 MED ORDER — POLYSACCHARIDE IRON COMPLEX 150 MG PO CAPS: 150.0000 mg | ORAL_CAPSULE | Freq: Every day | ORAL | Status: DC

## 2012-04-25 MED ORDER — OXYCODONE HCL 5 MG PO TABS: 5.0000 mg | ORAL_TABLET | ORAL | Status: DC | PRN

## 2012-04-25 NOTE — Progress Notes (Signed)
Physical Therapy Treatment Patient Details Name: Desiree Pittman MRN: [id number] DOB: Nov 16, 1950 Today's Date: 04/25/2012 Time: 3086-5784 PT Time Calculation (min): 31 min  PT Assessment / Plan / Recommendation Comments on Treatment Session       Follow Up Recommendations  Home health PT     Does the patient have the potential to tolerate intense rehabilitation     Barriers to Discharge        Equipment Recommendations  None recommended by PT    Recommendations for Other Services OT consult  Frequency 7X/week   Plan Discharge plan remains appropriate    Precautions / Restrictions Precautions Precautions: Posterior Hip Precaution Booklet Issued: Yes (comment) Precaution Comments: posterior hip precautions on L LE; none on R LE; reviewed all hip precautions with pt Restrictions Weight Bearing Restrictions: No RLE Weight Bearing: Weight bearing as tolerated LLE Weight Bearing: Weight bearing as tolerated   Pertinent Vitals/Pain Min c/o pain    Mobility  Bed Mobility Bed Mobility: Supine to Sit;Sit to Supine Supine to Sit: 4: Min guard Sit to Supine: 5: Supervision Details for Bed Mobility Assistance: min cues for sequence Transfers Transfers: Sit to Stand;Stand to Sit Sit to Stand: 5: Supervision Stand to Sit: 5: Supervision Details for Transfer Assistance: min cues for use of UEs Ambulation/Gait Ambulation/Gait Assistance: 5: Supervision;4: Min guard Ambulation Distance (Feet): 444 Feet Assistive device: Rolling walker Ambulation/Gait Assistance Details: min cues for position from RW and ER on L - pt progressing to recip gait Gait Pattern: Step-to pattern;Step-through pattern    Exercises     PT Diagnosis:    PT Problem List:   PT Treatment Interventions:     PT Goals Acute Rehab PT Goals PT Goal Formulation: With patient Time For Goal Achievement: 04/28/12 Potential to Achieve Goals: Good Pt will go Supine/Side to Sit: with supervision PT Goal:  Supine/Side to Sit - Progress: Progressing toward goal Pt will go Sit to Supine/Side: with supervision PT Goal: Sit to Supine/Side - Progress: Progressing toward goal Pt will go Sit to Stand: with supervision PT Goal: Sit to Stand - Progress: Progressing toward goal Pt will go Stand to Sit: with supervision PT Goal: Stand to Sit - Progress: Progressing toward goal Pt will Ambulate: >150 feet PT Goal: Ambulate - Progress: Progressing toward goal  Visit Information  Last PT Received On: 04/25/12 Assistance Needed: +1    Subjective Data  Patient Stated Goal: Resume previous active lifestyle   Cognition  Overall Cognitive Status: Appears within functional limits for tasks assessed/performed Arousal/Alertness: Awake/alert Orientation Level: Appears intact for tasks assessed Behavior During Session: [facility] for tasks performed    Balance     End of Session PT - End of Session Equipment Utilized During Treatment: Gait belt Activity Tolerance: Patient tolerated treatment well Patient left: with call bell/phone within reach;in bed Nurse Communication: Mobility status   GP     Kimi Kroft 04/25/2012, 4:18 PM

## 2012-04-25 NOTE — Progress Notes (Signed)
Occupational Therapy Treatment Patient Details Name: Desiree Pittman MRN: [id number] DOB: 1950-12-08 Today's Date: 04/25/2012 Time: 4696-2952 OT Time Calculation (min): 12 min  OT Assessment / Plan / Recommendation Comments on Treatment Session      Follow Up Recommendations  No OT follow up;Supervision/Assistance - 24 hour    Barriers to Discharge       Equipment Recommendations  None recommended by OT    Recommendations for Other Services    Frequency     Plan      Precautions / Restrictions Precautions Precautions: Posterior Hip Precaution Comments: posterior hip precautions on L LE; none on R LE; reviewed all hip precautions with pt Restrictions Weight Bearing Restrictions: No RLE Weight Bearing: Weight bearing as tolerated LLE Weight Bearing: Weight bearing as tolerated   Pertinent Vitals/Pain No c/o pain--just sore    ADL  Toilet Transfer: Buyer, retail Method: Sit to Barista:  (chair) Tub/Shower Transfer: Performed;Min guard Tub/Shower Transfer Method: Science writer: Walk in shower (pt has seat at home and grab bars) Equipment Used: Rolling walker ADL Comments: Pt given one vc for internal rotation when stepping out of shower.  Husband will assist with socks, and she feels comfortable with all adls    OT Diagnosis:    OT Problem List:   OT Treatment Interventions:     OT Goals ADL Goals Pt Will Transfer to Toilet: with supervision;Ambulation;with DME;3-in-1 ADL Goal: Toilet Transfer - Progress: Met (simulated) Pt Will Perform Tub/Shower Transfer: with min assist;with DME;Shower seat with back ADL Goal: Web designer - Progress: Met  Visit Information  Last OT Received On: 04/25/12 Assistance Needed: +1    Subjective Data      Prior Functioning       Cognition  Overall Cognitive Status: Appears within functional limits for tasks  assessed/performed Arousal/Alertness: Awake/alert Orientation Level: Appears intact for tasks assessed Behavior During Session: [facility] for tasks performed    Mobility  Shoulder Instructions Transfers Sit to Stand: 5: Supervision Details for Transfer Assistance: no vcs needed       Exercises      Balance     End of Session OT - End of Session Activity Tolerance: Patient tolerated treatment well Patient left: in chair;with call bell/phone within reach;with family/visitor present  GO     Desiree Pittman 04/25/2012, 10:51 AM Marica Otter, OTR/L [phone] 04/25/2012

## 2012-04-25 NOTE — Care Management Note (Unsigned)
    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 Plans are for her to return to her home in [place] where where spouse and adult children will be her caregivers. She already has DME which includes RW. States her home is handicapped equipped. Wants Genevieve Norlander   Anticipated DC Date:  04/26/2012   Anticipated DC Plan:  HOME W HOME HEALTH SERVICES  In-house referral  NA      DC Planning Services  CM consult      [facility] Choice  HOME HEALTH   Choice offered to / List presented to:  C-1 Patient   DME arranged  NA      DME agency  NA     HH arranged  HH-2 PT      [facility] agency  [facility]   Status of service:  Completed, signed off Medicare Important Message given?  NO (If response is "NO", the following Medicare IM given date fields will be blank) Date Medicare IM given:   Date Additional Medicare IM given:    Discharge Disposition:    Per UR Regulation:    If discussed at Long Length of Stay Meetings, dates discussed:    Comments:  04/25/2012 Colleen Can BSN RN CCM [phone] Genevieve Norlander will provide [facility] services with start date of day after discharge.

## 2012-04-25 NOTE — Progress Notes (Signed)
Physical Therapy Treatment Patient Details Name: Desiree Pittman MRN: [id number] DOB: May 24, 1950 Today's Date: 04/25/2012 Time: 7829-5621 PT Time Calculation (min): 43 min  PT Assessment / Plan / Recommendation Comments on Treatment Session       Follow Up Recommendations  Home health PT     Does the patient have the potential to tolerate intense rehabilitation     Barriers to Discharge        Equipment Recommendations  None recommended by PT    Recommendations for Other Services OT consult  Frequency 7X/week   Plan Discharge plan remains appropriate    Precautions / Restrictions Precautions Precautions: Posterior Hip Precaution Booklet Issued: Yes (comment) Precaution Comments: posterior hip precautions on L LE; none on R LE; reviewed all hip precautions with pt Restrictions Weight Bearing Restrictions: No RLE Weight Bearing: Weight bearing as tolerated LLE Weight Bearing: Weight bearing as tolerated   Pertinent Vitals/Pain 3/10; premedicated, ice packs provided    Mobility  Bed Mobility Bed Mobility: Supine to Sit Supine to Sit: 4: Min guard Details for Bed Mobility Assistance: min cues for sequence Transfers Transfers: Sit to Stand;Stand to Sit Sit to Stand: 5: Supervision Stand to Sit: 4: Min guard Details for Transfer Assistance: no vcs needed Ambulation/Gait Ambulation/Gait Assistance: 4: Min guard;5: Supervision Ambulation Distance (Feet): 240 Feet Assistive device: Rolling walker Ambulation/Gait Assistance Details: min cues for posture, stride length and ER on L Gait Pattern: Step-to pattern    Exercises Total Joint Exercises Ankle Circles/Pumps: AROM;15 reps;Supine;Both Quad Sets: AROM;Both;10 reps;Supine Gluteal Sets: AROM;Supine;15 reps;Both Heel Slides: AAROM;Supine;Both;20 reps Hip ABduction/ADduction: AAROM;15 reps;Both;Supine Long Arc Quad: AROM;Both;20 reps;Supine   PT Diagnosis:    PT Problem List:   PT Treatment Interventions:     PT  Goals Acute Rehab PT Goals PT Goal Formulation: With patient Time For Goal Achievement: 04/28/12 Potential to Achieve Goals: Good Pt will go Supine/Side to Sit: with supervision PT Goal: Supine/Side to Sit - Progress: Progressing toward goal Pt will go Sit to Supine/Side: with supervision PT Goal: Sit to Supine/Side - Progress: Progressing toward goal Pt will go Sit to Stand: with supervision PT Goal: Sit to Stand - Progress: Progressing toward goal Pt will go Stand to Sit: with supervision PT Goal: Stand to Sit - Progress: Progressing toward goal Pt will Ambulate: >150 feet PT Goal: Ambulate - Progress: Progressing toward goal Pt will Go Up / Down Stairs: 1-2 stairs;with supervision;with least restrictive assistive device  Visit Information  Last PT Received On: 04/25/12 Assistance Needed: +1    Subjective Data  Subjective: Doing good, min pain Patient Stated Goal: Resume previous active lifestyle   Cognition  Overall Cognitive Status: Appears within functional limits for tasks assessed/performed Arousal/Alertness: Awake/alert Orientation Level: Appears intact for tasks assessed Behavior During Session: [facility] for tasks performed    Balance     End of Session PT - End of Session Activity Tolerance: Patient tolerated treatment well Patient left: in chair;with call bell/phone within reach Nurse Communication: Mobility status   GP     Desiree Pittman 04/25/2012, 11:55 AM

## 2012-04-25 NOTE — Discharge Summary (Signed)
Physician Discharge Summary   Patient ID: Desiree Pittman MRN: [id number] DOB/AGE: Dec 20, 1950 61 y.o.  Admit date: 04/23/2012 Discharge date: 12/14/201  Primary Diagnosis:  1. Failed left total hip arthroplasty.  2. Osteoarthritis, right hip.  Admission Diagnoses:  Past Medical History  Diagnosis Date  . PONV (postoperative nausea and vomiting)     pull tubes out,etc.  . Hypertension   . Arthritis    Discharge Diagnoses:   Principal Problem:  *OA (osteoarthritis) of hip Active Problems:  Postop Acute blood loss anemia  Postop Hyponatremia  Estimated Body mass index is 25.42 kg/(m^2) as calculated from the following:   Height as of this encounter: 5\' 3" (1.6 m).   Weight as of this encounter: 143 lb 8.3 oz(65.1 kg).  Classification of overweight in adults according to BMI (WHO, 1998)   Procedure: Procedure(s) (LRB): TOTAL HIP ARTHROPLASTY ANTERIOR APPROACH (Right) TOTAL HIP REVISION (Left)   Consults: None  HPI: Desiree Pittman is a 61 year old female who has severe  end-stage osteoarthritis of her right hip and also had a left total hip  arthroplasty done several years ago with a metal-on-metal construct.  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 She  presents now for a primary right total hip arthroplasty and revision  left total hip arthroplasty. We discussed doing these procedures in  separate settings, but she states that from an occupational standpoint  and recovery standpoint, she would prefer to have both on the same time.  We agreed that since the right was more acutely symptomatic, we would do  the right side first and as long as that proceeded safely without  excessive blood loss or excessive time, then we would be able to do the  left side in the same setting. Procedure risks, potential  complications, and rehab course discussed with both and she elected to    proceed.  Laboratory Data: Admission on 04/23/2012  Component Date Value Range Status  . ABO/RH(D) 04/23/2012 A POS   Final  . Antibody Screen 04/23/2012 NEG   Final  . Sample Expiration 04/23/2012 04/26/2012   Final  . Specimen Description 04/23/2012 SYNOVIAL LEFT HIP   Final  . Special Requests 04/23/2012 NONE   Final  . Gram Stain 04/23/2012    Final                   Value:FEW WBC PRESENT, PREDOMINANTLY PMN                         NO ORGANISMS SEEN                         Performed by [facility] Gram Stain Report Called to,Read Back By and Verified With: Gram Stain Report Called to,Read Back By and Verified With: MURRAY  T. @14 :55 ON 04/23/12 BY LOVE T.  . Culture 04/23/2012 NO ANAEROBES ISOLATED; CULTURE IN PROGRESS FOR 5 DAYS   Final  . Report Status 04/23/2012 PENDING   Incomplete  . Specimen Description 04/23/2012 SYNOVIAL LEFT HIP   Final  . Special Requests 04/23/2012 NONE   Final  . Gram Stain 04/23/2012    Final                   Value:FEW WBC PRESENT, PREDOMINANTLY PMN  NO ORGANISMS SEEN                         Performed by [facility] Gram Stain Report Called to,Read Back By and Verified With: Gram Stain Report Called to,Read Back By and Verified With: MURRAY  M. @14 :55 ON 04/23/12 BY LOVE T.  . Culture 04/23/2012 NO GROWTH   Final  . Report Status 04/23/2012 PENDING   Incomplete  . Specimen Description 04/23/2012 SYNOVIAL LEFT HIP   Final  . Special Requests 04/23/2012 NONE   Final  . Gram Stain 04/23/2012    Final                   Value:FEW WBC PRESENT, PREDOMINANTLY PMN                         NO ORGANISMS SEEN                         Gram Stain Report Called to,Read Back By and Verified With: MURRAY,M. AT 1455 ON [id number] BY LOVE,T.  . Report Status 04/23/2012 04/23/2012 FINAL   Final  . WBC 04/24/2012 10.0  4.0 - 10.5 K/uL Final  . RBC 04/24/2012 3.03* 3.87 - 5.11 MIL/uL Final  . Hemoglobin 04/24/2012 9.9* 12.0 - 15.0 g/dL  Final  . HCT 91/47/8295 28.9* 36.0 - 46.0 % Final  . MCV 04/24/2012 95.4  78.0 - 100.0 fL Final  . MCH 04/24/2012 32.7  26.0 - 34.0 pg Final  . MCHC 04/24/2012 34.3  30.0 - 36.0 g/dL Final  . RDW 62/13/0865 13.3  11.5 - 15.5 % Final  . Platelets 04/24/2012 183  150 - 400 K/uL Final  . Sodium 04/24/2012 133* 135 - 145 mEq/L Final  . Potassium 04/24/2012 4.0  3.5 - 5.1 mEq/L Final  . Chloride 04/24/2012 100  96 - 112 mEq/L Final  . CO2 04/24/2012 23  19 - 32 mEq/L Final  . Glucose, Bld 04/24/2012 170* 70 - 99 mg/dL Final  . BUN 78/46/9629 8  6 - 23 mg/dL Final  . Creatinine, Ser 04/24/2012 0.51  0.50 - 1.10 mg/dL Final  . Calcium 52/84/1324 8.8  8.4 - 10.5 mg/dL Final  . GFR calc non Af Amer 04/24/2012 >90  >90 mL/min Final  . GFR calc Af Amer 04/24/2012 >90  >90 mL/min Final   Comment:                                 The eGFR has been calculated                          using the CKD EPI equation.                          This calculation has not been                          validated in all clinical                          situations.                          eGFR's  persistently                          <90 mL/min signify                          possible Chronic Kidney Disease.  . WBC 04/25/2012 15.6* 4.0 - 10.5 K/uL Final  . RBC 04/25/2012 2.87* 3.87 - 5.11 MIL/uL Final  . Hemoglobin 04/25/2012 9.2* 12.0 - 15.0 g/dL Final  . HCT 16/02/9603 28.0* 36.0 - 46.0 % Final  . MCV 04/25/2012 97.6  78.0 - 100.0 fL Final  . MCH 04/25/2012 32.1  26.0 - 34.0 pg Final  . MCHC 04/25/2012 32.9  30.0 - 36.0 g/dL Final  . RDW 54/01/8118 13.9  11.5 - 15.5 % Final  . Platelets 04/25/2012 184  150 - 400 K/uL Final  . Sodium 04/25/2012 139  135 - 145 mEq/L Final  . Potassium 04/25/2012 3.7  3.5 - 5.1 mEq/L Final  . Chloride 04/25/2012 105  96 - 112 mEq/L Final  . CO2 04/25/2012 28  19 - 32 mEq/L Final  . Glucose, Bld 04/25/2012 157* 70 - 99 mg/dL Final  . BUN 14/78/2956 16  6 - 23 mg/dL Final    . Creatinine, Ser 04/25/2012 0.56  0.50 - 1.10 mg/dL Final  . Calcium 21/30/8657 9.4  8.4 - 10.5 mg/dL Final  . GFR calc non Af Amer 04/25/2012 >90  >90 mL/min Final  . GFR calc Af Amer 04/25/2012 >90  >90 mL/min Final   Comment:                                 The eGFR has been calculated                          using the CKD EPI equation.                          This calculation has not been                          validated in all clinical                          situations.                          eGFR's persistently                          <90 mL/min signify                          possible Chronic Kidney Disease.  Hospital Outpatient Visit on 04/14/2012  Component Date Value Range Status  . MRSA, PCR 04/14/2012 NEGATIVE  NEGATIVE Final  . Staphylococcus aureus 04/14/2012 NEGATIVE  NEGATIVE Final   Comment:                                 The Xpert SA Assay (FDA  approved for NASAL specimens                          in patients over 87 years of age),                          is one component of                          a comprehensive surveillance                          program.  Test performance has                          been validated by [employer] for patients greater                          than or equal to 76 year old.                          It is not intended                          to diagnose infection nor to                          guide or monitor treatment.  Marland Kitchen aPTT 04/14/2012 26  24 - 37 seconds Final  . WBC 04/14/2012 6.8  4.0 - 10.5 K/uL Final  . RBC 04/14/2012 4.60  3.87 - 5.11 MIL/uL Final  . Hemoglobin 04/14/2012 15.0  12.0 - 15.0 g/dL Final  . HCT 16/02/9603 43.9  36.0 - 46.0 % Final  . MCV 04/14/2012 95.4  78.0 - 100.0 fL Final  . MCH 04/14/2012 32.6  26.0 - 34.0 pg Final  . MCHC 04/14/2012 34.2  30.0 - 36.0 g/dL Final  . RDW 54/01/8118 13.3  11.5 - 15.5 % Final  . Platelets 04/14/2012  267  150 - 400 K/uL Final  . Sodium 04/14/2012 139  135 - 145 mEq/L Final  . Potassium 04/14/2012 4.4  3.5 - 5.1 mEq/L Final  . Chloride 04/14/2012 99  96 - 112 mEq/L Final  . CO2 04/14/2012 28  19 - 32 mEq/L Final  . Glucose, Bld 04/14/2012 97  70 - 99 mg/dL Final  . BUN 14/78/2956 16  6 - 23 mg/dL Final  . Creatinine, Ser 04/14/2012 0.56  0.50 - 1.10 mg/dL Final  . Calcium 21/30/8657 11.0* 8.4 - 10.5 mg/dL Final  . Total Protein 04/14/2012 7.2  6.0 - 8.3 g/dL Final  . Albumin 84/69/6295 4.1  3.5 - 5.2 g/dL Final  . AST 28/41/3244 19  0 - 37 U/L Final  . ALT 04/14/2012 13  0 - 35 U/L Final  . Alkaline Phosphatase 04/14/2012 80  39 - 117 U/L Final  . Total Bilirubin 04/14/2012 0.4  0.3 - 1.2 mg/dL Final  . GFR calc non Af Amer 04/14/2012 >90  >90 mL/min Final  . GFR calc Af Amer 04/14/2012 >90  >90 mL/min Final   Comment:  The eGFR has been calculated                          using the CKD EPI equation.                          This calculation has not been                          validated in all clinical                          situations.                          eGFR's persistently                          <90 mL/min signify                          possible Chronic Kidney Disease.  Marland Kitchen Prothrombin Time 04/14/2012 11.8  11.6 - 15.2 seconds Final  . INR 04/14/2012 0.87  0.00 - 1.49 Final  . Color, Urine 04/14/2012 YELLOW  YELLOW Final  . APPearance 04/14/2012 CLEAR  CLEAR Final  . Specific Gravity, Urine 04/14/2012 1.008  1.005 - 1.030 Final  . pH 04/14/2012 6.5  5.0 - 8.0 Final  . Glucose, UA 04/14/2012 NEGATIVE  NEGATIVE mg/dL Final  . Hgb urine dipstick 04/14/2012 NEGATIVE  NEGATIVE Final  . Bilirubin Urine 04/14/2012 NEGATIVE  NEGATIVE Final  . Ketones, ur 04/14/2012 NEGATIVE  NEGATIVE mg/dL Final  . Protein, ur 82/95/6213 NEGATIVE  NEGATIVE mg/dL Final  . Urobilinogen, UA 04/14/2012 0.2  0.0 - 1.0 mg/dL Final  . Nitrite 08/65/7846 NEGATIVE   NEGATIVE Final  . Leukocytes, UA 04/14/2012 NEGATIVE  NEGATIVE Final   MICROSCOPIC NOT DONE ON URINES WITH NEGATIVE PROTEIN, BLOOD, LEUKOCYTES, NITRITE, OR GLUCOSE <1000 mg/dL.     X-Rays:Dg Chest 2 View  04/14/2012  *RADIOLOGY REPORT*  Clinical Data:  Preoperative respiratory exam for revision of left hip arthroplasty.  CHEST - 2 VIEW  Comparison: 03/22/2008  Findings: The heart size and mediastinal contours are within normal limits.  Both lungs are clear.  The visualized skeletal structures are unremarkable.  IMPRESSION: No active disease.   Original Report Authenticated By: Irish Lack, M.D.    Dg Hip Complete Left  04/14/2012  *RADIOLOGY REPORT*  Clinical Data: Preoperative exam for left hip revision.  LEFT HIP - COMPLETE 2+ VIEW  Comparison: 09/23/2006.  Findings: Post total left hip replacement.  Prominent sclerosis/bony overgrowth surrounds the femoral component.  IMPRESSION: Post total left hip replacement.  Prominent sclerosis/bony overgrowth surrounds the femoral component.   Original Report Authenticated By: Lacy Duverney, M.D.    Dg Hip Complete Right  04/23/2012  *RADIOLOGY REPORT*  Clinical Data: Right hip replacement  RIGHT HIP - COMPLETE 2+ VIEW  Comparison: 04/14/2012  Findings: C-arm images were obtained.  Right hip replacement in satisfactory position and alignment.  No immediate complication.  There is a metal wire overlying the ischial tuberosity on the right which was not present previously.  This may be a surgical instrument or outside the patient.  Attention to this on follow-up x-ray is suggested.  IMPRESSION: Satisfactory right hip replacement.  Question metal wire overlying the  right ischial tuberosity.   Original Report Authenticated By: Janeece Riggers, M.D.    Dg Hip Complete Right  04/14/2012  *RADIOLOGY REPORT*  Clinical Data: Preoperative exam for right hip replacement.  RIGHT HIP - COMPLETE 2+ VIEW  Comparison: None.  Findings: Prior left hip replacement.  Marked right  hip joint degenerative changes with slight incongruities of the right femoral head and complete loss of joint space with subchondral cystic and sclerotic changes.  Cystic changes most notable along the lateral and inferior aspect of the acetabulum.  Degenerative changes lower lumbar spine with disc space narrowing and osteophyte formation right lateral aspect of the L4-5 disc space.  Mild bilateral hip joint degenerative changes.  IMPRESSION: Marked right hip joint degenerative changes as detailed above.   Original Report Authenticated By: Lacy Duverney, M.D.    Dg Pelvis Portable  04/23/2012  *RADIOLOGY REPORT*  Clinical Data: Postop bilateral hip replacement  PORTABLE PELVIS  Comparison: 04/14/2012  Findings: Bilateral hip replacement in satisfactory position and alignment.  No fracture or acute complication.  Right hip replacement is new.  Left hip replacement has been revised since the prior study.  New acetabular cup on the left.  IMPRESSION: Satisfactory bilateral hip replacement.  No acute radiographic abnormality.   Original Report Authenticated By: Janeece Riggers, M.D.    Dg C-arm 61-120 Min-no Report  04/23/2012  CLINICAL DATA: anterior hip   C-ARM 61-120 MINUTES  Fluoroscopy was utilized by the requesting physician.  No radiographic  interpretation.      EKG: Orders placed during the hospital encounter of 04/14/12  . EKG 12-LEAD  . EKG 12-LEAD     Hospital Course: Patient was admitted to [facility] and taken to the OR and underwent the above state procedure without complications.  Patient tolerated the procedure well and was later transferred to the recovery room and then to the orthopaedic floor for postoperative care.  They were given PO and IV analgesics for pain control following their surgery.  They were given 24 hours of postoperative antibiotics of  Anti-infectives     Start     Dose/Rate Route Frequency Ordered Stop   04/23/12 1800   ceFAZolin (ANCEF) IVPB 1 g/50 mL  premix        1 g 100 mL/hr over 30 Minutes Intravenous Every 6 hours 04/23/12 1732 04/23/12 2347   04/23/12 0852   ceFAZolin (ANCEF) IVPB 2 g/50 mL premix        2 g 100 mL/hr over 30 Minutes Intravenous 60 min pre-op 04/23/12 0852 04/23/12 1204         and started on DVT prophylaxis in the form of Xarelto.   PT and OT were ordered for total hip protocol.  The patient was allowed to be WBAT with therapy. Discharge planning was consulted to help with postop disposition and equipment needs.  Patient had a good night on the evening of surgery and started to get up OOB with therapy on day one and walked nearly 100 feet the first time and 150 feet the second time.  Hemovac drain was pulled without difficulty.  The knee immobilizer was removed and discontinued.  Continued to work with therapy into day two.  Dressing was changed on day two and the incisions were healing well.  Patient was seen in rounds on POD 3 and was ready to go home.   Discharge Medications: Prior to Admission medications   Medication Sig Start Date End Date Taking? Authorizing Provider  diltiazem (DILACOR XR)  240 MG 24 hr capsule Take 240 mg by mouth every morning.   Yes Historical Provider, MD  losartan-hydrochlorothiazide (HYZAAR) 50-12.5 MG per tablet Take 1 tablet by mouth every morning.   Yes Historical Provider, MD  iron polysaccharides (NIFEREX) 150 MG capsule Take 1 capsule (150 mg total) by mouth daily. 04/25/12   Ferris Fielden Julien Girt, PA  methocarbamol (ROBAXIN) 500 MG tablet Take 1 tablet (500 mg total) by mouth every 6 (six) hours as needed. 04/25/12   Saman Umstead, PA  oxyCODONE (OXY IR/ROXICODONE) 5 MG immediate release tablet Take 1-2 tablets (5-10 mg total) by mouth every 3 (three) hours as needed. 04/25/12   Kimber Esterly Julien Girt, PA  rivaroxaban (XARELTO) 10 MG TABS tablet Take 1 tablet (10 mg total) by mouth daily with breakfast. Take Xarelto for two and a half more weeks, then discontinue Xarelto. Once  the patient has completed the Xarelto, then take a 325 mg Aspirin by mouth for 4 weeks, then resume the 81 mg Aspirin. 04/25/12   Lucianne Smestad Julien Girt, PA  traMADol (ULTRAM) 50 MG tablet Take 1 tablet (50 mg total) by mouth every 6 (six) hours as needed. Pain 04/25/12   Consetta Cosner Julien Girt, PA    Diet: Cardiac diet Activity:WBAT No bending hip over 90 degrees- A "L" Angle Do not cross legs Do not let foot roll inward When turning these patients a pillow should be placed between the patient's legs to prevent crossing. Patients should have the affected knee fully extended when trying to sit or stand from all surfaces to prevent excessive hip flexion. When ambulating and turning toward the affected side the affected leg should have the toes turned out prior to moving the walker and the rest of patient's body as to prevent internal rotation/ turning in of the leg. Abduction pillows are the most effective way to prevent a patient from not crossing legs or turning toes in at rest. If an abduction pillow is not ordered placing a regular pillow length wise between the patient's legs is also an effective reminder. It is imperative that these precautions be maintained so that the surgical hip does not dislocate. Follow-up:in 2 weeks Disposition - Home Discharged Condition: good   Discharge Orders    Future Orders Please Complete By Expires   Diet general      Call MD / Call 911      Comments:   If you experience chest pain or shortness of breath, CALL 911 and be transported to the hospital emergency room.  If you develope a fever above 101 F, pus (white drainage) or increased drainage or redness at the wound, or calf pain, call your surgeon's office.   Discharge instructions      Comments:   Pick up stool softner and laxative for home. Do not submerge incision under water. May shower. Continue to use ice for pain and swelling from surgery. Hip precautions.  Total Hip Protocol.  Take Xarelto  for two and a half more weeks, then discontinue Xarelto. Once the patient has completed the Xarelto, then take a  325 mg Aspirin by mouth for 4 weeks, then resume the 81 mg Aspirin.   Constipation Prevention      Comments:   Drink plenty of fluids.  Prune juice may be helpful.  You may use a stool softener, such as Colace (over the counter) 100 mg twice a day.  Use MiraLax (over the counter) for constipation as needed.   Increase activity slowly as tolerated      Patient  may shower      Comments:   You may shower without a dressing once there is no drainage.  Do not wash over the wound.  If drainage remains, do not shower until drainage stops.   Weight bearing as tolerated      Driving restrictions      Comments:   No driving until released by the physician.   Lifting restrictions      Comments:   No lifting until released by the physician.   Follow the hip precautions as taught in Physical Therapy      Change dressing      Comments:   You may change your dressing dressing daily with sterile 4 x 4 inch gauze dressing and paper tape.  Do not submerge the incision under water.   TED hose      Comments:   Use stockings (TED hose) for 3 weeks on both leg(s).  You may remove them at night for sleeping.   Do not sit on low chairs, stoools or toilet seats, as it may be difficult to get up from low surfaces          Medication List     As of 04/25/2012  8:20 AM    STOP taking these medications         ALFALFA PO      aspirin EC 81 MG tablet      CALCIUM CITRATE + D PO      GRAPE SEED CR PO      HYDROcodone-acetaminophen 5-325 MG per tablet   Commonly known as: NORCO/VICODIN      meloxicam 7.5 MG tablet   Commonly known as: MOBIC      TAKE these medications         diltiazem 240 MG 24 hr capsule   Commonly known as: DILACOR XR   Take 240 mg by mouth every morning.      iron polysaccharides 150 MG capsule   Commonly known as: NIFEREX   Take 1 capsule (150 mg total) by  mouth daily.      losartan-hydrochlorothiazide 50-12.5 MG per tablet   Commonly known as: HYZAAR   Take 1 tablet by mouth every morning.      methocarbamol 500 MG tablet   Commonly known as: ROBAXIN   Take 1 tablet (500 mg total) by mouth every 6 (six) hours as needed.      oxyCODONE 5 MG immediate release tablet   Commonly known as: Oxy IR/ROXICODONE   Take 1-2 tablets (5-10 mg total) by mouth every 3 (three) hours as needed.      rivaroxaban 10 MG Tabs tablet   Commonly known as: XARELTO   Take 1 tablet (10 mg total) by mouth daily with breakfast. Take Xarelto for two and a half more weeks, then discontinue Xarelto.  Once the patient has completed the Xarelto, then take a 325 mg Aspirin by mouth for 4 weeks, then resume the 81 mg Aspirin.      traMADol 50 MG tablet   Commonly known as: ULTRAM   Take 1 tablet (50 mg total) by mouth every 6 (six) hours as needed. Pain           Follow-up Information    Follow up with Loanne Drilling, MD. Schedule an appointment as soon as possible for a visit in 2 weeks.   Contact information:   [address] [place] [address] [phone]  SignedPatrica Duel 04/25/2012, 8:20 AM

## 2012-04-25 NOTE — Progress Notes (Signed)
   Subjective: 2 Days Post-Op Procedure(s) (LRB): TOTAL HIP ARTHROPLASTY ANTERIOR APPROACH (Right) TOTAL HIP REVISION (Left) Patient reports pain as mild.   Patient seen in rounds for Dr. Lequita Halt. Patient is well, but has had some minor complaints of pain in the hip and thigh, requiring pain medications. She did very well with therapy on POD 1 walking over 150 feet. Plan is to go Home after hospital stay.  Objective: Vital signs in last 24 hours: Temp:  [98 F (36.7 C)-100.2 F (37.9 C)] 99.5 F (37.5 C) (12/13 0546) Pulse Rate:  [77-80] 77  (12/13 0546) Resp:  [14-16] 16  (12/13 0546) BP: (126-134)/(64-68) 134/66 mmHg (12/13 0546) SpO2:  [91 %-100 %] 91 % (12/13 0546)  Intake/Output from previous day:  Intake/Output Summary (Last 24 hours) at 04/25/12 0803 Last data filed at 04/25/12 0600  Gross per 24 hour  Intake   2480 ml  Output   1950 ml  Net    530 ml     Labs:  Basename 04/25/12 0430 04/24/12 0405  HGB 9.2* 9.9*    Basename 04/25/12 0430 04/24/12 0405  WBC 15.6* 10.0  RBC 2.87* 3.03*  HCT 28.0* 28.9*  PLT 184 183    Basename 04/25/12 0430 04/24/12 0405  NA 139 133*  K 3.7 4.0  CL 105 100  CO2 28 23  BUN 16 8  CREATININE 0.56 0.51  GLUCOSE 157* 170*  CALCIUM 9.4 8.8    EXAM General - Patient is Alert, Appropriate and Oriented Extremity - Neurovascular intact Sensation intact distally Dorsiflexion/Plantar flexion intact No cellulitis present to both lower extremitites Dressing/Incision - clean, dry, no drainage, healing to both the left and right incisions Motor Function - intact, moving feet and toes well on exam.   Past Medical History  Diagnosis Date  . PONV (postoperative nausea and vomiting)     pull tubes out,etc.  . Hypertension   . Arthritis     Assessment/Plan: 2 Days Post-Op Procedure(s) (LRB): TOTAL HIP ARTHROPLASTY ANTERIOR APPROACH (Right) TOTAL HIP REVISION (Left) Principal Problem:  *OA (osteoarthritis) of hip Active  Problems:  Postop Acute blood loss anemia  Postop Hyponatremia  Estimated Body mass index is 25.42 kg/(m^2) as calculated from the following:   Height as of this encounter: 5\' 3" (1.6 m).   Weight as of this encounter: 143 lb 8.3 oz(65.1 kg). Up with therapy Plan for discharge tomorrow Discharge home with home health  DVT Prophylaxis - Xarelto, ASA 81 mg on hold Hip Preacutions on the Left Revision Hip  Weight Bearing As Tolerated both Legs   Desiree Pittman 04/25/2012, 8:03 AM

## 2012-04-26 LAB — CBC
Platelets: 176 10*3/uL (ref 150–400)
RBC: 2.78 MIL/uL — ABNORMAL LOW (ref 3.87–5.11)
RDW: 14 % (ref 11.5–15.5)
WBC: 12.4 10*3/uL — ABNORMAL HIGH (ref 4.0–10.5)

## 2012-04-26 NOTE — Progress Notes (Signed)
Discharged to [facility] via w/c. Assessment unchanged from am. 

## 2012-04-26 NOTE — Progress Notes (Signed)
   Subjective: 3 Days Post-Op Procedure(s) (LRB): TOTAL HIP ARTHROPLASTY ANTERIOR APPROACH (Right) TOTAL HIP REVISION (Left) Patient reports pain as mild.   Patient is doing great with minimal hip pain. Already feels better than pre-op Plan is to go Home after hospital stay.  Objective: Vital signs in last 24 hours: Temp:  [98.3 F (36.8 C)-99.8 F (37.7 C)] 98.3 F (36.8 C) (12/14 0556) Pulse Rate:  [80-90] 80  (12/14 0556) Resp:  [14-16] 14  (12/14 0556) BP: (129-159)/(69-80) 129/69 mmHg (12/14 0556) SpO2:  [93 %-94 %] 93 % (12/14 0556)  Intake/Output from previous day:  Intake/Output Summary (Last 24 hours) at 04/26/12 0823 Last data filed at 04/25/12 1900  Gross per 24 hour  Intake    720 ml  Output      0 ml  Net    720 ml    Intake/Output this shift:    Labs:  Basename 04/26/12 0449 04/25/12 0430 04/24/12 0405  HGB 9.0* 9.2* 9.9*    Basename 04/26/12 0449 04/25/12 0430  WBC 12.4* 15.6*  RBC 2.78* 2.87*  HCT 27.3* 28.0*  PLT 176 184    Basename 04/25/12 0430 04/24/12 0405  NA 139 133*  K 3.7 4.0  CL 105 100  CO2 28 23  BUN 16 8  CREATININE 0.56 0.51  GLUCOSE 157* 170*  CALCIUM 9.4 8.8   No results found for this basename: LABPT:2,INR:2 in the last 72 hours  EXAM General - Patient is Alert, Appropriate and Oriented Extremity - Neurologically intact Neurovascular intact Incision: no drainage No cellulitis present Dressing/Incision - clean, dry, no drainage Motor Function - intact, moving foot and toes well on exam.   Past Medical History  Diagnosis Date  . PONV (postoperative nausea and vomiting)     pull tubes out,etc.  . Hypertension   . Arthritis     Assessment/Plan: 3 Days Post-Op Procedure(s) (LRB): TOTAL HIP ARTHROPLASTY ANTERIOR APPROACH (Right) TOTAL HIP REVISION (Left) Principal Problem:  *OA (osteoarthritis) of hip Active Problems:  Postop Acute blood loss anemia  Postop Hyponatremia   Discharge home with home  health  DVT Prophylaxis - Xarelto Weight Bearing As Tolerated bilateral Legs  Clara Smolen V 04/26/2012, 8:23 AM

## 2012-04-26 NOTE — Progress Notes (Signed)
Physical Therapy Treatment Patient Details Name: Desiree Pittman MRN: [id number] DOB: 1950-11-10 Today's Date: 04/26/2012 Time: 4098-1191 PT Time Calculation (min): 31 min  PT Assessment / Plan / Recommendation Comments on Treatment Session       Follow Up Recommendations  Home health PT     Does the patient have the potential to tolerate intense rehabilitation     Barriers to Discharge        Equipment Recommendations  None recommended by PT    Recommendations for Other Services OT consult  Frequency 7X/week   Plan Discharge plan remains appropriate    Precautions / Restrictions Precautions Precautions: Posterior Hip Precaution Booklet Issued: Yes (comment) Precaution Comments: posterior hip precautions on L LE; none on R LE; reviewed all hip precautions with pt Restrictions RLE Weight Bearing: Weight bearing as tolerated LLE Weight Bearing: Weight bearing as tolerated   Pertinent Vitals/Pain 3-4/10; premedicated    Mobility  Bed Mobility Bed Mobility: Supine to Sit;Sit to Supine Supine to Sit: 4: Min guard Sit to Supine: 5: Supervision Details for Bed Mobility Assistance: min cues for sequence Transfers Transfers: Sit to Stand;Stand to Sit Sit to Stand: 5: Supervision Stand to Sit: 5: Supervision Details for Transfer Assistance: min cues for use of UEs Ambulation/Gait Ambulation/Gait Assistance: 5: Supervision Ambulation Distance (Feet): 240 Feet Assistive device: Rolling walker Ambulation/Gait Assistance Details: min cues for ER on R Gait Pattern: Step-to pattern;Step-through pattern    Exercises Total Joint Exercises Ankle Circles/Pumps: AROM;15 reps;Supine;Both Quad Sets: AROM;Both;10 reps;Supine Gluteal Sets: AROM;Supine;Both;20 reps Heel Slides: AAROM;Supine;Both;20 reps Hip ABduction/ADduction: AAROM;Both;Supine;20 reps   PT Diagnosis:    PT Problem List:   PT Treatment Interventions:     PT Goals Acute Rehab PT Goals PT Goal Formulation:  With patient Time For Goal Achievement: 04/28/12 Potential to Achieve Goals: Good Pt will go Supine/Side to Sit: with supervision PT Goal: Supine/Side to Sit - Progress: Progressing toward goal Pt will go Sit to Supine/Side: with supervision PT Goal: Sit to Supine/Side - Progress: Progressing toward goal Pt will go Sit to Stand: with supervision PT Goal: Sit to Stand - Progress: Met Pt will go Stand to Sit: with supervision PT Goal: Stand to Sit - Progress: Met Pt will Ambulate: >150 feet PT Goal: Ambulate - Progress: Met Pt will Go Up / Down Stairs: 1-2 stairs;with supervision;with least restrictive assistive device PT Goal: Up/Down Stairs - Progress: Discontinued (comment)  Visit Information  Last PT Received On: 04/26/12 Assistance Needed: +1    Subjective Data  Subjective: I'm going home today Patient Stated Goal: Resume previous active lifestyle   Cognition  Overall Cognitive Status: Appears within functional limits for tasks assessed/performed Arousal/Alertness: Awake/alert Orientation Level: Appears intact for tasks assessed Behavior During Session: [facility] for tasks performed    Balance     End of Session PT - End of Session Equipment Utilized During Treatment: Gait belt Activity Tolerance: Patient tolerated treatment well Patient left: with call bell/phone within reach;in chair Nurse Communication: Mobility status   GP     Shigeru Lampert 04/26/2012, 1:00 PM

## 2012-04-27 LAB — BODY FLUID CULTURE: Culture: NO GROWTH

## 2012-04-28 ENCOUNTER — Encounter (HOSPITAL_COMMUNITY): Payer: Self-pay | Admitting: Orthopedic Surgery

## 2012-04-28 LAB — ANAEROBIC CULTURE

## 2012-05-20 ENCOUNTER — Encounter: Payer: Self-pay | Admitting: Orthopedic Surgery

## 2012-06-14 ENCOUNTER — Encounter: Payer: Self-pay | Admitting: Orthopedic Surgery

## 2012-07-12 ENCOUNTER — Encounter: Payer: Self-pay | Admitting: Orthopedic Surgery

## 2012-07-22 ENCOUNTER — Other Ambulatory Visit: Payer: Self-pay | Admitting: Obstetrics and Gynecology

## 2012-07-22 DIAGNOSIS — N63 Unspecified lump in unspecified breast: Secondary | ICD-10-CM

## 2012-08-13 ENCOUNTER — Other Ambulatory Visit: Payer: Self-pay | Admitting: Obstetrics and Gynecology

## 2012-08-13 ENCOUNTER — Ambulatory Visit
Admission: RE | Admit: 2012-08-13 | Discharge: 2012-08-13 | Disposition: A | Discharge status: Home / Self Care | Payer: BC Managed Care – PPO | Source: Ambulatory Visit | Attending: Obstetrics and Gynecology | Admitting: Obstetrics and Gynecology

## 2012-08-13 DIAGNOSIS — N63 Unspecified lump in unspecified breast: Secondary | ICD-10-CM

## 2013-02-18 ENCOUNTER — Other Ambulatory Visit: Payer: Self-pay | Admitting: Obstetrics and Gynecology

## 2013-02-18 DIAGNOSIS — N63 Unspecified lump in unspecified breast: Secondary | ICD-10-CM

## 2013-03-03 ENCOUNTER — Ambulatory Visit
Admission: RE | Admit: 2013-03-03 | Discharge: 2013-03-03 | Disposition: A | Discharge status: Home / Self Care | Payer: BC Managed Care – PPO | Source: Ambulatory Visit | Attending: Obstetrics and Gynecology | Admitting: Obstetrics and Gynecology

## 2013-03-03 DIAGNOSIS — N63 Unspecified lump in unspecified breast: Secondary | ICD-10-CM

## 2014-02-02 ENCOUNTER — Other Ambulatory Visit: Payer: Self-pay

## 2014-02-02 DIAGNOSIS — Z1231 Encounter for screening mammogram for malignant neoplasm of breast: Secondary | ICD-10-CM

## 2014-03-04 ENCOUNTER — Ambulatory Visit
Admission: RE | Admit: 2014-03-04 | Discharge: 2014-03-04 | Disposition: A | Discharge status: Home / Self Care | Payer: Managed Care, Other (non HMO) | Source: Ambulatory Visit

## 2014-03-04 DIAGNOSIS — Z1231 Encounter for screening mammogram for malignant neoplasm of breast: Secondary | ICD-10-CM

## 2015-01-31 ENCOUNTER — Ambulatory Visit (HOSPITAL_COMMUNITY)
Admission: RE | Admit: 2015-01-31 | Discharge: 2015-01-31 | Disposition: A | Discharge status: Home / Self Care | Payer: 59 | Source: Ambulatory Visit | Attending: Family Medicine | Admitting: Family Medicine

## 2015-01-31 ENCOUNTER — Ambulatory Visit (INDEPENDENT_AMBULATORY_CARE_PROVIDER_SITE_OTHER): Payer: 59

## 2015-01-31 ENCOUNTER — Ambulatory Visit (INDEPENDENT_AMBULATORY_CARE_PROVIDER_SITE_OTHER): Payer: 59 | Admitting: Family Medicine

## 2015-01-31 VITALS — BP 128/76 | HR 78 | Temp 98.6°F | Resp 18 | Ht 63.0 in | Wt 129.0 lb

## 2015-01-31 DIAGNOSIS — S40862S Insect bite (nonvenomous) of left upper arm, sequela: Secondary | ICD-10-CM | POA: Diagnosis not present

## 2015-01-31 DIAGNOSIS — R10811 Right upper quadrant abdominal tenderness: Secondary | ICD-10-CM

## 2015-01-31 DIAGNOSIS — R109 Unspecified abdominal pain: Secondary | ICD-10-CM | POA: Diagnosis not present

## 2015-01-31 DIAGNOSIS — R0781 Pleurodynia: Secondary | ICD-10-CM | POA: Diagnosis present

## 2015-01-31 DIAGNOSIS — K8020 Calculus of gallbladder without cholecystitis without obstruction: Secondary | ICD-10-CM | POA: Insufficient documentation

## 2015-01-31 DIAGNOSIS — R197 Diarrhea, unspecified: Secondary | ICD-10-CM | POA: Diagnosis not present

## 2015-01-31 DIAGNOSIS — N8320 Unspecified ovarian cysts: Secondary | ICD-10-CM | POA: Insufficient documentation

## 2015-01-31 DIAGNOSIS — D259 Leiomyoma of uterus, unspecified: Secondary | ICD-10-CM | POA: Diagnosis not present

## 2015-01-31 DIAGNOSIS — K7689 Other specified diseases of liver: Secondary | ICD-10-CM | POA: Insufficient documentation

## 2015-01-31 DIAGNOSIS — W57XXXS Bitten or stung by nonvenomous insect and other nonvenomous arthropods, sequela: Secondary | ICD-10-CM

## 2015-01-31 LAB — POCT SEDIMENTATION RATE: POCT SED RATE: 4 mm/hr (ref 0–22)

## 2015-01-31 LAB — COMPREHENSIVE METABOLIC PANEL
ALK PHOS: 67 U/L (ref 33–130)
ALT: 11 U/L (ref 6–29)
AST: 16 U/L (ref 10–35)
Albumin: 4.6 g/dL (ref 3.6–5.1)
BUN: 23 mg/dL (ref 7–25)
CALCIUM: 10.7 mg/dL — AB (ref 8.6–10.4)
CHLORIDE: 103 mmol/L (ref 98–110)
CO2: 25 mmol/L (ref 20–31)
Creat: 0.57 mg/dL (ref 0.50–0.99)
GLUCOSE: 80 mg/dL (ref 65–99)
POTASSIUM: 5.1 mmol/L (ref 3.5–5.3)
Sodium: 141 mmol/L (ref 135–146)
Total Bilirubin: 0.6 mg/dL (ref 0.2–1.2)
Total Protein: 6.6 g/dL (ref 6.1–8.1)

## 2015-01-31 LAB — POCT URINALYSIS DIPSTICK
Bilirubin, UA: NEGATIVE
Glucose, UA: NEGATIVE
Ketones, UA: 15
Nitrite, UA: NEGATIVE
Protein, UA: NEGATIVE
Spec Grav, UA: >=1.03
Urobilinogen, UA: 0.2
pH, UA: 5

## 2015-01-31 LAB — POCT UA - MICROSCOPIC ONLY
Bacteria, U Microscopic: NEGATIVE
Casts, Ur, LPF, POC: NEGATIVE
Crystals, Ur, HPF, POC: NEGATIVE
Yeast, UA: NEGATIVE

## 2015-01-31 LAB — POCT CBC
GRANULOCYTE PERCENT: 65.6 % (ref 37–80)
HEMATOCRIT: 46 % (ref 37.7–47.9)
HEMOGLOBIN: 14.7 g/dL (ref 12.2–16.2)
Lymph, poc: 1.8 (ref 0.6–3.4)
MCH: 30.8 pg (ref 27–31.2)
MCHC: 31.9 g/dL (ref 31.8–35.4)
MCV: 96.5 fL (ref 80–97)
MID (cbc): 0.6 (ref 0–0.9)
MPV: 8.1 fL (ref 0–99.8)
POC GRANULOCYTE: 4.5 (ref 2–6.9)
POC LYMPH PERCENT: 25.6 %L (ref 10–50)
POC MID %: 8.8 % (ref 0–12)
Platelet Count, POC: 248 10*3/uL (ref 142–424)
RBC: 4.77 M/uL (ref 4.04–5.48)
RDW, POC: 13.3 %
WBC: 6.9 10*3/uL (ref 4.6–10.2)

## 2015-01-31 LAB — POCT I-STAT CREATININE: Creatinine, Ser: 0.6 mg/dL (ref 0.44–1.00)

## 2015-01-31 LAB — C-REACTIVE PROTEIN: CRP: <0.5 mg/dL (ref <0.60)

## 2015-01-31 LAB — CK: Total CK: 53 U/L (ref 7–177)

## 2015-01-31 MED ORDER — IOHEXOL 300 MG/ML  SOLN
50.0000 mL | Freq: Once | INTRAMUSCULAR | Status: AC | PRN
Start: 2015-01-31 — End: 2015-01-31
  Administered 2015-01-31: 50 mL via ORAL

## 2015-01-31 MED ORDER — ACETAMINOPHEN-CODEINE #3 300-30 MG PO TABS: 1.0000 | ORAL_TABLET | ORAL | Status: DC | PRN

## 2015-01-31 MED ORDER — IOHEXOL 300 MG/ML  SOLN
100.0000 mL | Freq: Once | INTRAMUSCULAR | Status: AC | PRN
  Administered 2015-01-31: 100 mL via INTRAVENOUS

## 2015-01-31 NOTE — Patient Instructions (Signed)
Please go to [facility] for your scheduled CT.  Please go to the 1st floor Radiology Department  Arrive by 4:30 pm and prepare to drink your contrast.  Do not stop on the way to drink or eat anything

## 2015-01-31 NOTE — Progress Notes (Addendum)
Subjective:  This chart was scribed for Delman Cheadle, MD by Thea Alken, ED Scribe. This patient was seen in room 4 and the patient's care was started at 2:12 PM.   Patient ID: Desiree Pittman, female    DOB: 06-22-1950, 64 y.o.   MRN: [id number]  HPI Chief Complaint  Patient presents with  . Abdominal Pain    since labor day off and on  . Rib Injury    right side     HPI Comments: Desiree Pittman is a 64 y.o. female who presents to the [facility] complaining of abdominal pain for 2 weeks. Pt states 2 weeks ago she was leaning over a railing trying to reach for something when she slipped causing the railing to jab her rib. She did not have pain immediately but developed gradually worsening abdominal pain and right lower rib pain a couple days later that worsened yesterday. She takes meloxicam 7.5mg  as needed and tramadol. She has tried taking tramadol and tylenol for abdominal pain without improvement of the pain but has not tried meloxicam. She mentions she was bitten by 4 ticks this year, most recently within the last month to her left axilla. She was tested for lyme by PCP, Dr Georga Bora and was prescribed a 21 day coarse of doxycyline. She did not have blood drawn, metabolic panel, or Xrays. She began to have diarrhea 2 days ago. She had a normal BM this morning but had green diarrhea this afternoon while at work. She denies difficulty urinating, abdominal distention, bruising,   Patient Active Problem List   Diagnosis Date Noted  . Postop Acute blood loss anemia 04/24/2012  . Postop Hyponatremia 04/24/2012  . OA (osteoarthritis) of hip 04/23/2012   Past Medical History  Diagnosis Date  . PONV (postoperative nausea and vomiting)     pull tubes out,etc.  . Hypertension   . Arthritis   . Allergy   . Diabetes mellitus without complication    Prior to Admission medications   Medication Sig Start Date End Date Taking? Authorizing Madeliene Tejera  diltiazem (DILACOR XR)  240 MG 24 hr capsule Take 240 mg by mouth every morning.   Yes Historical Elainah Rhyne, MD  doxycycline (VIBRAMYCIN) 100 MG capsule Take 100 mg by mouth 2 (two) times daily.   Yes Historical Kishaun Erekson, MD  losartan-hydrochlorothiazide (HYZAAR) 50-12.5 MG per tablet Take 1 tablet by mouth every morning.   Yes Historical Luisa Louk, MD  metFORMIN (GLUCOPHAGE) 1000 MG tablet Take 1,000 mg by mouth at bedtime.   Yes Historical Tyeasha Ebbs, MD  iron polysaccharides (NIFEREX) 150 MG capsule Take 1 capsule (150 mg total) by mouth daily. Patient not taking: Reported on 01/31/2015 04/25/12   Arlee Muslim, PA-C  methocarbamol (ROBAXIN) 500 MG tablet Take 1 tablet (500 mg total) by mouth every 6 (six) hours as needed. Patient not taking: Reported on 01/31/2015 04/25/12   Arlee Muslim, PA-C  oxyCODONE (OXY IR/ROXICODONE) 5 MG immediate release tablet Take 1-2 tablets (5-10 mg total) by mouth every 3 (three) hours as needed. Patient not taking: Reported on 01/31/2015 04/25/12   Arlee Muslim, PA-C  rivaroxaban (XARELTO) 10 MG TABS tablet Take 1 tablet (10 mg total) by mouth daily with breakfast. Take Xarelto for two and a half more weeks, then discontinue Xarelto. Once the patient has completed the Xarelto, then take a 325 mg Aspirin by mouth for 4 weeks, then resume the 81 mg Aspirin. Patient not taking: Reported on 01/31/2015 04/25/12   Arlee Muslim, PA-C  traMADol (ULTRAM) 50 MG tablet Take 1 tablet (50 mg total) by mouth every 6 (six) hours as needed. Pain Patient not taking: Reported on 01/31/2015 04/25/12   Arlee Muslim, PA-C   Review of Systems  Constitutional: Negative for fever and chills.  Gastrointestinal: Positive for abdominal pain and diarrhea. Negative for nausea, vomiting and abdominal distention.  Genitourinary: Negative for dysuria and difficulty urinating.  Musculoskeletal: Positive for arthralgias.  Hematological: Negative for adenopathy. Does not bruise/bleed easily.       Objective:   Physical  Exam  Constitutional: She is oriented to person, place, and time. She appears well-developed and well-nourished. No distress.  HENT:  Head: Normocephalic and atraumatic.  Eyes: Conjunctivae and EOM are normal.  Neck: Neck supple.  Cardiovascular: Normal rate, regular rhythm, S1 normal and S2 normal.   Murmur ( right upper sternal border) heard.  Systolic murmur is present with a grade of 2/6  Pulmonary/Chest: Effort normal and breath sounds normal. No respiratory distress. She has no wheezes. She has no rales.  Abdominal: Bowel sounds are increased. There is tenderness. There is positive Murphy's sign.   Tenderness palpabe over liver edge. most distal rib. No crepitus.   Musculoskeletal: Normal range of motion.  Neurological: She is alert and oriented to person, place, and time.  Skin: Skin is warm and dry.  4 cm hyperpigment papule blanched central clearing with a pin point nodule.  Psychiatric: She has a normal mood and affect. Her behavior is normal.  Nursing note and vitals reviewed.  Filed Vitals:   01/31/15 1352  BP: 128/76  Pulse: 78  Temp: 98.6 F (37 C)  TempSrc: Oral  Resp: 18  Height: 5\' 3"  (1.6 m)  Weight: 129 lb (58.514 kg)  SpO2: 99%   Results for orders placed or performed in visit on 01/31/15  POCT CBC  Result Value Ref Range   WBC 6.9 4.6 - 10.2 K/uL   Lymph, poc 1.8 0.6 - 3.4   POC LYMPH PERCENT 25.6 10 - 50 %L   MID (cbc) 0.6 0 - 0.9   POC MID % 8.8 0 - 12 %M   POC Granulocyte 4.5 2 - 6.9   Granulocyte percent 65.6 37 - 80 %G   RBC 4.77 4.04 - 5.48 M/uL   Hemoglobin 14.7 12.2 - 16.2 g/dL   HCT, POC 46.0 37.7 - 47.9 %   MCV 96.5 80 - 97 fL   MCH, POC 30.8 27 - 31.2 pg   MCHC 31.9 31.8 - 35.4 g/dL   RDW, POC 13.3 %   Platelet Count, POC 248 142 - 424 K/uL   MPV 8.1 0 - 99.8 fL  POCT SEDIMENTATION RATE  Result Value Ref Range   POCT SED RATE 4 0 - 22 mm/hr  POCT UA - Microscopic Only  Result Value Ref Range   WBC, Ur, HPF, POC 0-2    RBC, urine,  microscopic 0-1    Bacteria, U Microscopic Neg    Mucus, UA Trace    Epithelial cells, urine per micros 0-1    Crystals, Ur, HPF, POC Neg    Casts, Ur, LPF, POC Neg    Yeast, UA Neg   POCT urinalysis dipstick  Result Value Ref Range   Color, UA Yellow    Clarity, UA Clear    Glucose, UA Negative    Bilirubin, UA Negative    Ketones, UA 15    Spec Grav, UA >=1.030    Blood, UA Trace  pH, UA 5.0    Protein, UA Negative    Urobilinogen, UA 0.2    Nitrite, UA Negative    Leukocytes, UA Trace (A) Negative   [facility] reading (PRIMARY) by Dr. Brigitte Pulse. Right rib- large amount of bowel gas. Non specific, non obstructive pattern. Possible enlarged liver shadow. Possible injury to most distal aspect of 11th rib on oblique view but appears normal on other so suspect no acute bony injury. Chest is clear, heart size is normal.   Assessment & Plan:   1. Right upper quadrant abdominal tenderness   2. Tick bite of left axillary region, sequela   3. Rib pain on right side   4. Diarrhea - c. 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 Start CT ordered, trx symptomatically for now.   Rib fractures seen on xray.  CT obtained today due to concern for liver injury from recent trauma with severe RUQ pain on exam and new diarrhea which was fortunately did not show any acute abnormalities.  Splint as needed for coughing/laughing. Pain management.  F/u w/PCP in next sev days. If develops cough, pt reminded to come in for repeat eval since higher risk of PNA with rib fracture.  F/u  w/ her PCP for additional eval as indicated of the gallstones, hepatic and ovarian cyst, and fibroids incidentally found on CT.   Orders Placed This Encounter  Procedures  . Clostridium Difficile by PCR    Order Specific Question:  Is your patient experiencing loose or watery stools (3 or more in 24 hours)?    Answer:  Yes     Order Specific Question:  Has the patient received laxatives in the last 24 hours?    Answer:  No    Order Specific Question:  Has a negative Cdiff test resulted in the last 7 days?    Answer:  No  . DG Ribs Unilateral W/Chest Right    Standing Status: Future     Number of Occurrences: 1     Standing Expiration Date: 01/31/2016    Order Specific Question:  Reason for Exam (SYMPTOM  OR DIAGNOSIS REQUIRED)    Answer:  low impact injury 2 wks ago with gradually worsening    Order Specific Question:  Preferred imaging location?    Answer:  External  . CT Abdomen Pelvis W Contrast    Standing Status: Future     Number of Occurrences: 1     Standing Expiration Date: 05/01/2016    Order Specific Question:  Reason for Exam (SYMPTOM  OR DIAGNOSIS REQUIRED)    Answer:  r/o liver hematoma, cholecystiasis, low right rib injury    Order Specific Question:  Preferred imaging location?    Answer:  [facility]  . B. burgdorfi antibodies  . Comprehensive metabolic panel  . C-reactive protein  . CK  . Fecal lactoferrin  . Basic metabolic panel    Standing Status: Future     Number of Occurrences:      Standing Expiration Date: 02/13/2016    Order Specific Question:  Has the patient fasted?    Answer:  Yes  . Lyme Ab/Western Blot Reflex  . POCT CBC  . POCT SEDIMENTATION RATE  . POC Hemoccult Bld/Stl (3-Cd Home Screen)    Standing Status: Future     Number of Occurrences:      Standing Expiration Date: 01/31/2016  . POCT UA -  Microscopic Only  . POCT urinalysis dipstick    Meds ordered this encounter  Medications  . doxycycline (VIBRAMYCIN) 100 MG capsule    Sig: Take 100 mg by mouth 2 (two) times daily.  . metFORMIN (GLUCOPHAGE) 1000 MG tablet    Sig: Take 1,000 mg by mouth at bedtime.  Marland Kitchen acetaminophen-codeine (TYLENOL #3) 300-30 MG per tablet    Sig: Take 1-2 tablets by mouth every 4 (four) hours as needed for moderate pain.    Dispense:  60 tablet    Refill:  0    I  personally performed the services described in this documentation, which was scribed in my presence. The recorded information has been reviewed and considered, and addended by me as needed.  Delman Cheadle, MD MPH   By signing my name below, I, Raven Small, attest that this documentation has been prepared under the direction and in the presence of Delman Cheadle, MD.  Electronically Signed: Thea Alken, ED Scribe. 01/31/2015. 2:33 PM.  Dg Ribs Unilateral W/chest Right  01/31/2015   CLINICAL DATA:  Status post fall today with right rib pain. Initial encounter.  EXAM: RIGHT RIBS AND CHEST - 3+ VIEW  COMPARISON:  PA and lateral chest 04/14/2012.  FINDINGS: The lungs are clear. Heart size is normal. No pneumothorax or pleural effusion. There is a nondisplaced fracture of the anterior arc of the right right eighth rib. No other fracture is identified. Convex right lumbar scoliosis is noted.  IMPRESSION: Nondisplaced fracture right eighth rib. Negative for pneumothorax. No other acute abnormality is identified.   Electronically Signed   By: Inge Rise M.D.   On: 01/31/2015 15:35   Ct Abdomen Pelvis W Contrast  01/31/2015   CLINICAL DATA:  Fall onto railing with right-sided rib pain and right-sided abdominal pain  EXAM: CT ABDOMEN AND PELVIS WITH CONTRAST  TECHNIQUE: Multidetector CT imaging of the abdomen and pelvis was performed using the standard protocol following bolus administration of intravenous contrast.  CONTRAST:  29mL OMNIPAQUE IOHEXOL 300 MG/ML SOLN, 155mL OMNIPAQUE IOHEXOL 300 MG/ML SOLN  COMPARISON:  None.  FINDINGS: The lung bases are free of acute infiltrate or sizable effusion.  The liver shows a large cyst in the caudate lobe which measures approximately 3 cm in greatest dimension. Poorly calcified stones are noted within a decompressed gallbladder. No biliary ductal dilatation is seen. The pancreas, adrenal glands and spleen are within normal limits. The kidneys show a normal enhancement pattern  with normal excretion. No focal mass lesion is noted.  The appendix is well visualized and within normal limits. Uterine fibroid change is noted with some calcifications. Small ovarian cysts are seen. The bladder is well distended. Evaluation of the pelvis is somewhat limited by scatter artifact from bilateral hip replacements. No gross abnormality is noted. Mild aortoiliac calcifications are seen. The osseous structures show no acute abnormality. Bilateral hip replacements are noted as previously described. No soft tissue hematoma is noted.  IMPRESSION: Cholelithiasis without complicating factors.  Hepatic cyst.  Uterine fibroid change.  Small right ovarian cysts   Electronically Signed   By: Inez Catalina M.D.   On: 01/31/2015 19:05   Over 45 min spent in face-to-face evaluation of and consultation with patient and coordination of care.  Over 50% of this time was spent counseling this patient.

## 2015-02-01 LAB — LYME AB/WESTERN BLOT REFLEX: B burgdorferi Ab IgG+IgM: 0.19 {ISR}

## 2015-02-02 ENCOUNTER — Encounter: Payer: Self-pay | Admitting: Family Medicine

## 2015-02-04 LAB — CLOSTRIDIUM DIFFICILE BY PCR: CDIFFPCR: NOT DETECTED

## 2015-02-04 LAB — FECAL LACTOFERRIN, QUANT: LACTOFERRIN: NEGATIVE

## 2015-02-08 ENCOUNTER — Other Ambulatory Visit: Payer: Self-pay

## 2015-02-08 DIAGNOSIS — Z1231 Encounter for screening mammogram for malignant neoplasm of breast: Secondary | ICD-10-CM

## 2015-02-11 ENCOUNTER — Other Ambulatory Visit: Payer: Self-pay | Admitting: Obstetrics and Gynecology

## 2015-02-11 DIAGNOSIS — Z13820 Encounter for screening for osteoporosis: Secondary | ICD-10-CM

## 2015-03-15 ENCOUNTER — Ambulatory Visit
Admission: RE | Admit: 2015-03-15 | Discharge: 2015-03-15 | Disposition: A | Discharge status: Home / Self Care | Payer: 59 | Source: Ambulatory Visit

## 2015-03-15 DIAGNOSIS — Z1231 Encounter for screening mammogram for malignant neoplasm of breast: Secondary | ICD-10-CM

## 2015-10-21 ENCOUNTER — Ambulatory Visit (INDEPENDENT_AMBULATORY_CARE_PROVIDER_SITE_OTHER): Payer: 59 | Admitting: Physician Assistant

## 2015-10-21 VITALS — BP 122/72 | HR 75 | Temp 97.9°F | Resp 17 | Ht 62.5 in | Wt 127.0 lb

## 2015-10-21 DIAGNOSIS — R05 Cough: Secondary | ICD-10-CM | POA: Diagnosis not present

## 2015-10-21 DIAGNOSIS — J019 Acute sinusitis, unspecified: Secondary | ICD-10-CM | POA: Diagnosis not present

## 2015-10-21 DIAGNOSIS — R059 Cough, unspecified: Secondary | ICD-10-CM

## 2015-10-21 MED ORDER — AMOXICILLIN-POT CLAVULANATE 875-125 MG PO TABS: 1.0000 | ORAL_TABLET | Freq: Two times a day (BID) | ORAL | Status: AC

## 2015-10-21 MED ORDER — IPRATROPIUM BROMIDE 0.03 % NA SOLN: 2.0000 | Freq: Two times a day (BID) | NASAL | Status: DC

## 2015-10-21 MED ORDER — GUAIFENESIN ER 1200 MG PO TB12: 1.0000 | ORAL_TABLET | Freq: Two times a day (BID) | ORAL | Status: DC | PRN

## 2015-10-21 MED ORDER — BENZONATATE 100 MG PO CAPS: 100.0000 mg | ORAL_CAPSULE | Freq: Three times a day (TID) | ORAL | Status: DC | PRN

## 2015-10-21 MED ORDER — HYDROCOD POLST-CPM POLST ER 10-8 MG/5ML PO SUER: 5.0000 mL | Freq: Two times a day (BID) | ORAL | Status: DC | PRN

## 2015-10-21 NOTE — Progress Notes (Signed)
Patient ID: Desiree Pittman, female    DOB: [birth year], 65 y.o.   MRN: [id number]  PCP: Elgie Collard, MD  Subjective:   Chief Complaint  Patient presents with  . Sore Throat  . Cough  . Depression    HPI Presents for evaluation of respiratory illness x 7 days.  Began with sore throat, then eye and ear congestion, hacking cough and post-nasal draiange. Feels "fuzzy headed," but not dizzy and has not had syncope. Some thick goopy drainage from her eye has resolved, but the eyes still feel red and irritated. Chest feels tight and heavy with coughing. No other myalgias.   She is grieving the recent death of her 64 year old son, following years of struggle with alcoholism. She saw her PCP who prescribed a low dose benzodiazepine, which didn't help. She hasn't gone back, "I don't know that there is anything she can do." Has not reached out to support groups, therapy, etc.    Depression screen [facility] 2/9 10/21/2015 01/31/2015  Decreased Interest 2 0  Down, Depressed, Hopeless 2 0  PHQ - 2 Score 4 0  Altered sleeping 2 -  Tired, decreased energy 2 -  Change in appetite 2 -  Feeling bad or failure about yourself  2 -  Trouble concentrating 2 -  Moving slowly or fidgety/restless 2 -  Suicidal thoughts 0 -  PHQ-9 Score 16 -  Difficult doing work/chores Very difficult -       Review of Systems As above.    Patient Active Problem List   Diagnosis Date Noted  . Postop Acute blood loss anemia 04/24/2012  . Postop Hyponatremia 04/24/2012  . OA (osteoarthritis) of hip 04/23/2012     Prior to Admission medications   Medication Sig Start Date End Date Taking? Authorizing Provider  losartan-hydrochlorothiazide (HYZAAR) 50-12.5 MG per tablet Take 1 tablet by mouth every morning.   Yes Historical Provider, MD  metFORMIN (GLUCOPHAGE) 1000 MG tablet Take 1,000 mg by mouth at bedtime.   Yes Historical Provider, MD  NIFEdipine (PROCARDIA-XL/ADALAT CC) 30 MG 24 hr tablet Take 30  mg by mouth daily.   Yes Historical Provider, MD  meloxicam (MOBIC) 7.5 MG tablet Take 7.5 mg by mouth daily. 08/24/15   Historical Provider, MD  traMADol (ULTRAM) 50 MG tablet TAKE 1 OR 2 TABLETS BY MOUTH EVERY 6 TO 8 HOURS AS NEEDED FOR PAIN 09/13/15   Historical Provider, MD     No Known Allergies     Objective:  Physical Exam  Constitutional: She is oriented to person, place, and time. She appears well-developed and well-nourished. She is active and cooperative. No distress.  BP 122/72 mmHg  Pulse 75  Temp(Src) 97.9 F (36.6 C) (Oral)  Resp 17  Ht 5' 2.5" (1.588 m)  Wt 127 lb (57.607 kg)  BMI 22.84 kg/m2  SpO2 97%  HENT:  Head: Normocephalic and atraumatic.  Right Ear: Hearing, tympanic membrane, external ear and ear canal normal.  Left Ear: Hearing, tympanic membrane, external ear and ear canal normal.  Nose: Mucosal edema present. Right sinus exhibits maxillary sinus tenderness and frontal sinus tenderness. Left sinus exhibits maxillary sinus tenderness and frontal sinus tenderness.  Mouth/Throat: Uvula is midline and oropharynx is clear and moist. No oral lesions. No uvula swelling.  Eyes: Conjunctivae and EOM are normal. Pupils are equal, round, and reactive to light. No scleral icterus.  Neck: Normal range of motion. Neck supple. No thyromegaly present.  Cardiovascular: Normal rate, regular rhythm and normal heart sounds.  Pulses:      Radial pulses are 2+ on the right side, and 2+ on the left side.  Pulmonary/Chest: Effort normal and breath sounds normal.  Lymphadenopathy:       Head (right side): No tonsillar, no preauricular, no posterior auricular and no occipital adenopathy present.       Head (left side): No tonsillar, no preauricular, no posterior auricular and no occipital adenopathy present.    She has no cervical adenopathy.       Right: No supraclavicular adenopathy present.       Left: No supraclavicular adenopathy present.  Neurological: She is alert and  oriented to person, place, and time. No sensory deficit.  Skin: Skin is warm, dry and intact. No rash noted. No cyanosis or erythema. Nails show no clubbing.  Psychiatric: Her speech is normal and behavior is normal. Judgment and thought content normal. Her mood appears not anxious. Her affect is not angry, not blunt, not labile and not inappropriate. Cognition and memory are normal. She exhibits a depressed mood.           Assessment & Plan:   1. Subacute sinusitis, unspecified location Augmentin, ipratropium nasal spray, OTC Mucinex. Rest, hydration. - ipratropium (ATROVENT) 0.03 % nasal spray; Place 2 sprays into both nostrils 2 (two) times daily.  Dispense: 30 mL; Refill: 0 - Guaifenesin (MUCINEX MAXIMUM STRENGTH) 1200 MG TB12; Take 1 tablet (1,200 mg total) by mouth every 12 (twelve) hours as needed.  Dispense: 14 tablet; Refill: 1 - amoxicillin-clavulanate (AUGMENTIN) 875-125 MG tablet; Take 1 tablet by mouth 2 (two) times daily.  Dispense: 20 tablet; Refill: 0  2. Cough Due to drainage from #1. - benzonatate (TESSALON) 100 MG capsule; Take 1-2 capsules (100-200 mg total) by mouth 3 (three) times daily as needed for cough.  Dispense: 40 capsule; Refill: 0 - chlorpheniramine-HYDROcodone (TUSSIONEX PENNKINETIC ER) 10-8 MG/5ML SUER; Take 5 mLs by mouth every 12 (twelve) hours as needed for cough.  Dispense: 100 mL; Refill: 0   Encouraged her to contact her PCP to discuss other treatments (?SSRI?) and possible referral/recommendation for therapy. Also encouraged her to contact Hospice for counseling, support groups, etc.    Fara Chute, PA-C Physician Assistant-Certified [facility] [place] [facility]

## 2015-10-21 NOTE — Progress Notes (Signed)
Subjective:    Patient ID: Desiree Pittman, female    DOB: [birth year], 65 y.o.   MRN: [id number] Chief Complaint  Patient presents with  . Sore Throat  . Cough  . Depression    HPI  Patient is 65 yo female who presents today with a 7 day history of congestion.  It started with her head feeling "fuzzy" and a sore throat, followed the next day by red eyes with thick, green discharge.  She wakes every morning with her eyes crusted shut. Symptoms have continued to progress over the last week.  Admits to chest pain associated with her cough, ear fullness/congestion and drainage, post-nasal drip, dry hacking cough, anorexia, and tooth pain.  Denies fever, nausea, vomiting, diarrhea, chest pain, hemoptysis, sputum production, rhinorhea, draining down back of throat,myalgias,   Has tried multiple OTC cold products, some relief from Alka seltzer plus for about 4 hours and an relief for 1 night after drinking old home remedy of hot tea and bourbon.  History of seasonal allergies, but states those ended weeks ago, not currently taking antihistamine.  Received her flu shot last year.  Patient admits to depression, a few months ago her son, who was an alcoholic, committed suicide in her house and she found him.  PCP prescribed antidepressant, and told her to come in for evaluation if she requires refills or additional treatment.  Patient felt the antidepressant was not effective and has not returned to PCP office.  Review of Systems      Objective:   Physical Exam  Constitutional: She is oriented to person, place, and time. She appears well-developed and well-nourished.  HENT:  Right Ear: No middle ear effusion.  Left Ear: A middle ear effusion is present.  Nose: Sinus tenderness present. No rhinorrhea. Right sinus exhibits maxillary sinus tenderness and frontal sinus tenderness. Left sinus exhibits maxillary sinus tenderness and frontal sinus tenderness.  Mouth/Throat: Posterior oropharyngeal  erythema present.  Eyes: Pupils are equal, round, and reactive to light. Right eye exhibits no discharge. Left eye exhibits no discharge.  Neck: Normal range of motion. Neck supple. No thyromegaly present.  Cardiovascular: Normal rate, regular rhythm, normal heart sounds and intact distal pulses.   Pulmonary/Chest: Effort normal. She has wheezes.  Neurological: She is alert and oriented to person, place, and time.  Skin: Skin is warm and dry.  Psychiatric: Her speech is normal and behavior is normal. Judgment and thought content normal. Cognition and memory are normal. She exhibits a depressed mood.          Assessment & Plan:  1. Subacute sinusitis, unspecified location - ipratropium (ATROVENT) 0.03 % nasal spray; Place 2 sprays into both nostrils 2 (two) times daily.  Dispense: 30 mL; Refill: 0 - Guaifenesin (MUCINEX MAXIMUM STRENGTH) 1200 MG TB12; Take 1 tablet (1,200 mg total) by mouth every 12 (twelve) hours as needed.  Dispense: 14 tablet; Refill: 1 - amoxicillin-clavulanate (AUGMENTIN) 875-125 MG tablet; Take 1 tablet by mouth 2 (two) times daily.  Dispense: 20 tablet; Refill: 0 2. Cough - benzonatate (TESSALON) 100 MG capsule; Take 1-2 capsules (100-200 mg total) by mouth 3 (three) times daily as needed for cough.  Dispense: 40 capsule; Refill: 0 - chlorpheniramine-HYDROcodone (TUSSIONEX PENNKINETIC ER) 10-8 MG/5ML SUER; Take 5 mLs by mouth every 12 (twelve) hours as needed for cough.  Dispense: 100 mL; Refill: 0  Discussed diagnosis and treatment with patient.  Provided Mucinex to help loosen and thin mucus, Atrovent spray to help open nasal passages, Augmentin  to combat bacterial infection present, and tessalon to help with daytime cough symptoms and Tussionex to help with night time cough as needed.   Patient advised to get plenty of rest and drink at least 64 oz of water a day.  Discussed patient grief and depression.  Recommended for her to contact [facility]osp[facility] about possible  therapist and support groups to attend to help her through this process.  As well as contacting her PCP to discuss alternate medication options that may be more effective in helping her through the grieving process.  If symptoms worse or fail to improve please return or follow up with PCP.  Latise Dilley P. Ciarra Braddy, PA-S

## 2015-10-21 NOTE — Patient Instructions (Signed)
Get plenty of rest and drink at least 64 ounces of water daily.  Contact Hospice to get additional names for counseling and/or support groups. Consider contacting your PCP to discuss alternate medications that may help as you grieve.    IF you received an x-ray today, you will receive an invoice from [facility]. Please contact [facility] at [phone] with questions or concerns regarding your invoice.   IF you received labwork today, you will receive an invoice from [employer]. Please contact [facility] at [phone] with questions or concerns regarding your invoice.   Our billing staff will not be able to assist you with questions regarding bills from these companies.  You will be contacted with the lab results as soon as they are available. The fastest way to get your results is to activate your My Chart account. Instructions are located on the last page of this paperwork. If you have not heard from [place] regarding the results in 2 weeks, please contact this office.

## 2015-12-12 ENCOUNTER — Encounter: Payer: Self-pay | Admitting: Gastroenterology

## 2016-02-17 ENCOUNTER — Ambulatory Visit: Payer: 59 | Admitting: Gastroenterology

## 2016-03-05 ENCOUNTER — Other Ambulatory Visit: Payer: Self-pay | Admitting: Obstetrics and Gynecology

## 2016-03-05 DIAGNOSIS — Z1231 Encounter for screening mammogram for malignant neoplasm of breast: Secondary | ICD-10-CM

## 2016-03-15 ENCOUNTER — Ambulatory Visit
Admission: RE | Admit: 2016-03-15 | Discharge: 2016-03-15 | Disposition: A | Discharge status: Home / Self Care | Payer: 59 | Source: Ambulatory Visit | Attending: Obstetrics and Gynecology | Admitting: Obstetrics and Gynecology

## 2016-03-15 DIAGNOSIS — Z1231 Encounter for screening mammogram for malignant neoplasm of breast: Secondary | ICD-10-CM

## 2016-07-29 LAB — HM PAP SMEAR: HM Pap smear: NEGATIVE

## 2016-08-03 ENCOUNTER — Ambulatory Visit: Payer: 59

## 2017-01-25 ENCOUNTER — Ambulatory Visit: Payer: Self-pay | Admitting: Surgery

## 2017-01-25 DIAGNOSIS — R198 Other specified symptoms and signs involving the digestive system and abdomen: Secondary | ICD-10-CM | POA: Diagnosis not present

## 2017-01-28 ENCOUNTER — Other Ambulatory Visit: Payer: Self-pay | Admitting: Surgery

## 2017-01-28 DIAGNOSIS — R198 Other specified symptoms and signs involving the digestive system and abdomen: Secondary | ICD-10-CM

## 2017-01-28 DIAGNOSIS — K629 Disease of anus and rectum, unspecified: Secondary | ICD-10-CM

## 2017-02-15 ENCOUNTER — Ambulatory Visit
Admission: RE | Admit: 2017-02-15 | Discharge: 2017-02-15 | Disposition: A | Discharge status: Home / Self Care | Payer: Medicare Other | Source: Ambulatory Visit | Attending: Surgery | Admitting: Surgery

## 2017-02-15 DIAGNOSIS — R198 Other specified symptoms and signs involving the digestive system and abdomen: Secondary | ICD-10-CM

## 2017-02-15 DIAGNOSIS — K629 Disease of anus and rectum, unspecified: Secondary | ICD-10-CM

## 2017-02-15 DIAGNOSIS — R159 Full incontinence of feces: Secondary | ICD-10-CM | POA: Diagnosis not present

## 2017-02-22 ENCOUNTER — Ambulatory Visit (HOSPITAL_COMMUNITY): Admission: RE | Admit: 2017-02-22 | Payer: Medicare Other | Source: Ambulatory Visit | Admitting: Surgery

## 2017-02-22 ENCOUNTER — Encounter (HOSPITAL_COMMUNITY): Admission: RE | Payer: Self-pay | Source: Ambulatory Visit

## 2017-02-22 SURGERY — MANOMETRY, ANORECTAL

## 2017-03-04 ENCOUNTER — Encounter (HOSPITAL_COMMUNITY): Payer: Self-pay

## 2017-03-06 ENCOUNTER — Encounter (HOSPITAL_COMMUNITY): Payer: Self-pay | Admitting: Certified Registered Nurse Anesthetist

## 2017-03-07 ENCOUNTER — Ambulatory Visit (HOSPITAL_COMMUNITY): Payer: Medicare Other | Admitting: Certified Registered Nurse Anesthetist

## 2017-03-07 ENCOUNTER — Encounter (HOSPITAL_COMMUNITY)
Admission: RE | Disposition: A | Discharge status: Home / Self Care | Payer: Self-pay | Source: Ambulatory Visit | Attending: Surgery

## 2017-03-07 ENCOUNTER — Encounter (HOSPITAL_COMMUNITY): Payer: Self-pay

## 2017-03-07 ENCOUNTER — Ambulatory Visit (HOSPITAL_COMMUNITY)
Admission: RE | Admit: 2017-03-07 | Discharge: 2017-03-07 | Disposition: A | Discharge status: Home / Self Care | Payer: Medicare Other | Source: Ambulatory Visit | Attending: Surgery | Admitting: Surgery

## 2017-03-07 DIAGNOSIS — Z7984 Long term (current) use of oral hypoglycemic drugs: Secondary | ICD-10-CM | POA: Diagnosis not present

## 2017-03-07 DIAGNOSIS — E119 Type 2 diabetes mellitus without complications: Secondary | ICD-10-CM | POA: Insufficient documentation

## 2017-03-07 DIAGNOSIS — Q439 Congenital malformation of intestine, unspecified: Secondary | ICD-10-CM | POA: Diagnosis not present

## 2017-03-07 DIAGNOSIS — I10 Essential (primary) hypertension: Secondary | ICD-10-CM | POA: Diagnosis not present

## 2017-03-07 DIAGNOSIS — K622 Anal prolapse: Secondary | ICD-10-CM | POA: Diagnosis not present

## 2017-03-07 DIAGNOSIS — Z791 Long term (current) use of non-steroidal anti-inflammatories (NSAID): Secondary | ICD-10-CM | POA: Diagnosis not present

## 2017-03-07 DIAGNOSIS — K644 Residual hemorrhoidal skin tags: Secondary | ICD-10-CM | POA: Diagnosis not present

## 2017-03-07 DIAGNOSIS — Z79899 Other long term (current) drug therapy: Secondary | ICD-10-CM | POA: Diagnosis not present

## 2017-03-07 DIAGNOSIS — D125 Benign neoplasm of sigmoid colon: Secondary | ICD-10-CM | POA: Diagnosis not present

## 2017-03-07 DIAGNOSIS — K648 Other hemorrhoids: Secondary | ICD-10-CM | POA: Insufficient documentation

## 2017-03-07 DIAGNOSIS — K921 Melena: Secondary | ICD-10-CM | POA: Insufficient documentation

## 2017-03-07 DIAGNOSIS — K5730 Diverticulosis of large intestine without perforation or abscess without bleeding: Secondary | ICD-10-CM | POA: Insufficient documentation

## 2017-03-07 DIAGNOSIS — K635 Polyp of colon: Secondary | ICD-10-CM | POA: Diagnosis not present

## 2017-03-07 HISTORY — PX: COLONOSCOPY WITH PROPOFOL: SHX5780

## 2017-03-07 LAB — GLUCOSE, CAPILLARY: Glucose-Capillary: 99 mg/dL (ref 65–99)

## 2017-03-07 SURGERY — COLONOSCOPY WITH PROPOFOL
Anesthesia: Monitor Anesthesia Care

## 2017-03-07 MED ORDER — DEXAMETHASONE SODIUM PHOSPHATE 10 MG/ML IJ SOLN
INTRAMUSCULAR | Status: AC
  Filled 2017-03-07: qty 1

## 2017-03-07 MED ORDER — LIDOCAINE 2% (20 MG/ML) 5 ML SYRINGE
INTRAMUSCULAR | Status: AC
  Filled 2017-03-07: qty 5

## 2017-03-07 MED ORDER — PROPOFOL 500 MG/50ML IV EMUL
INTRAVENOUS | Status: DC | PRN
  Administered 2017-03-07: 125 ug/kg/min via INTRAVENOUS

## 2017-03-07 MED ORDER — SODIUM CHLORIDE 0.9 % IV SOLN: INTRAVENOUS | Status: DC

## 2017-03-07 MED ORDER — PROPOFOL 10 MG/ML IV BOLUS
INTRAVENOUS | Status: AC
  Filled 2017-03-07: qty 60

## 2017-03-07 MED ORDER — LIDOCAINE 2% (20 MG/ML) 5 ML SYRINGE
INTRAMUSCULAR | Status: DC | PRN
  Administered 2017-03-07: 50 mg via INTRAVENOUS

## 2017-03-07 MED ORDER — LACTATED RINGERS IV SOLN
INTRAVENOUS | Status: DC
  Administered 2017-03-07: 1000 mL via INTRAVENOUS

## 2017-03-07 MED ORDER — ONDANSETRON HCL 4 MG/2ML IJ SOLN
INTRAMUSCULAR | Status: AC
  Filled 2017-03-07: qty 2

## 2017-03-07 MED ORDER — PROPOFOL 10 MG/ML IV BOLUS
INTRAVENOUS | Status: DC | PRN
  Administered 2017-03-07: 30 mg via INTRAVENOUS
  Administered 2017-03-07 (×2): 20 mg via INTRAVENOUS

## 2017-03-07 MED ORDER — ONDANSETRON HCL 4 MG/2ML IJ SOLN
INTRAMUSCULAR | Status: DC | PRN
  Administered 2017-03-07: 4 mg via INTRAVENOUS

## 2017-03-07 NOTE — Transfer of Care (Signed)
Immediate Anesthesia Transfer of Care Note  Patient: Desiree Pittman  Procedure(s) Performed: COLONOSCOPY WITH PROPOFOL (N/A )  Patient Location: PACU and Endoscopy Unit  Anesthesia Type:MAC  Level of Consciousness: awake, alert , oriented and patient cooperative  Airway & Oxygen Therapy: Patient Spontanous Breathing and Patient connected to nasal cannula oxygen  Post-op Assessment: Report given to RN and Post -op Vital signs reviewed and stable  Post vital signs: stable  Last Vitals:  Vitals:   03/07/17 0824  BP: (!) 163/84  Pulse: 66  Resp: 12  Temp: 36.6 C  SpO2: 100%    Last Pain:  Vitals:   03/07/17 0824  TempSrc: Oral         Complications: No apparent anesthesia complications

## 2017-03-07 NOTE — H&P (Signed)
CC: Here for colonoscopy  HPI 66yF here for colonoscopy. 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 Had issues 20 years ago following a colonoscopy with what she was told were thrombosed external hemorrhoids and had them lanced, felt better for a couple years and then they came back. Was seen and told she could have sclerosant injected and underwent this treatment. Following this, noted some improvement but not fully. Over the past year or two has had increasingly severe symptoms of anal discomfort with BMs and bleeding. Occasionally notes a ball of tissue outside the anal canal that she subsequently has to reduce. Applies Hurricane gel with some relief. Works as a Copywriter, advertising and this has been problematic as her job involves sitting for prolonged periods. Has BM every morning No fiber supplement Spends 2-3 minutes on commode Notes over past year increasing difficulties with complete evacuation and has to return to the commode a couple times to fully evacuate Last cscope was 67yrs ago - no noted findings per pt  G2P2 - both vaginal; no traumatic birthing events  PMH: HTN, DM controlled on medication PSH: Hip surgery - bilateral replacements; plus what's mentioned above in HPI Social: Denies tobacco; EtOH - 2-3glasses wine/night; denies illicit drug use FHx: Denies family history of cancer.  Past Surgical History  Hip Surgery   Bilateral.  Diagnostic Studies History [facility] Gave M. Bakari Nikolai, MD; 01/25/2017 1:13 PM) Colonoscopy   >10 years ago Mammogram   within last year Pap Smear   1-5 years ago  Allergies No Known Drug Allergies  01/25/2017  Medication History TraMADol HCl  (50MG  Tablet, Oral) Active. Losartan Potassium-HCTZ  (50-12.5MG  Tablet, Oral) Active. Meloxicam  (7.5MG  Tablet, Oral) Active. MetFORMIN HCl  (1000MG  Tablet, Oral) Active. NIFEdipine ER Osmotic Release  (30MG  Tablet ER 24HR, Oral) Active. Medications Reconciled   Social  History Alcohol use   Moderate alcohol use. Caffeine use   Coffee, Tea. No drug use   Tobacco use   Never smoker.  Family History Alcohol Abuse   Son. Arthritis   Mother. Cancer   Father. Depression   Son. Hypertension   Mother.  Pregnancy / Birth History Age of menopause   41-60 Contraceptive History   Oral contraceptives. Gravida   2 Length (months) of breastfeeding   7-12 Maternal age   44-25 Para   2  Other Problems Arthritis   Depression   Diabetes Mellitus   General anesthesia - complications   Hemorrhoids   High blood pressure     Review of Systems General Present- Night Sweats and Weight Loss. Not Present- Appetite Loss, Chills, Fatigue, Fever and Weight Gain. Skin Present- Rash. Not Present- Change in Wart/Mole, Dryness, Hives, Jaundice, New Lesions, Non-Healing Wounds and Ulcer. HEENT Present- Seasonal Allergies. Not Present- Earache, Hearing Loss, Hoarseness, Nose Bleed, Oral Ulcers, Ringing in the Ears, Sinus Pain, Sore Throat, Visual Disturbances, Wears glasses/contact lenses and Yellow Eyes. Respiratory Not Present- Bloody sputum, Chronic Cough, Difficulty Breathing, Snoring and Wheezing. Breast Not Present- Breast Mass, Breast Pain, Nipple Discharge and Skin Changes. Cardiovascular Not Present- Chest Pain, Difficulty Breathing Lying Down, Leg Cramps, Palpitations, Rapid Heart Rate, Shortness of Breath and Swelling of Extremities. Gastrointestinal Present- Bloating, Excessive gas, Hemorrhoids and Rectal Pain. Not Present- Abdominal Pain, Bloody Stool, Change in Bowel Habits, Chronic diarrhea, Constipation, Difficulty Swallowing, Gets full quickly at meals, Indigestion, Nausea and Vomiting. Female Genitourinary Present- Nocturia and Urgency. Not Present- Frequency, Painful Urination and Pelvic Pain. Musculoskeletal Present- Joint Pain. Not  Present- Back Pain, Joint Stiffness, Muscle Pain, Muscle Weakness and Swelling of Extremities. Neurological Present- Tingling  and Trouble walking. Not Present- Decreased Memory, Fainting, Headaches, Numbness, Seizures, Tremor and Weakness. Psychiatric Present- Depression and Frequent crying. Not Present- Anxiety, Bipolar, Change in Sleep Pattern and Fearful. Endocrine Present- Hair Changes and Hot flashes. Not Present- Cold Intolerance, Excessive Hunger, Heat Intolerance and New Diabetes.  Vitals:   03/07/17 0824  BP: (!) 163/84  Pulse: 66  Resp: 12  Temp: 97.8 F (36.6 C)  TempSrc: Oral  SpO2: 100%  Weight: 56.7 kg (125 lb)  Height: 5\' 3"  (1.6 m)    Physical Exam  General Mental Status - Alert. General Appearance - Cooperative. Orientation - Oriented X3. Posture - Normal posture. Gait - Normal.  Eye Pupil - Left - Normal.  Chest and Lung Exam Chest and lung exam reveals  - quiet, even and easy respiratory effort with no use of accessory muscles.  Cardiovascular Note:  Regular rate and rythm   Abdomen Note:  soft, NT/ND, no palpable hepatosplenomegaly   Rectal Note:  External skin tags with 3 radial folds DRE: No palpable rectal masses; normal sphincter tone Anoscopy: 3 column internal hemorrhoids Feigned defecation on commode - prolapsing hemorrhoidal tissue, 3 columns   Lymphatic Note:  No pitting edema  Assessment & Plan Prolapsing hemorrhoids; rectal bleeding  Impression: 3 column mixed internal and external hemorrhoids -Diagnostic colonoscopy today  Sharon Mt. Dema Severin, M.D. General and Colorectal Surgery [facility]

## 2017-03-07 NOTE — Op Note (Signed)
[facility] Patient Name: Desiree Pittman Procedure Date: 03/07/2017 MRN: [id number] Attending MD: Ileana Roup MD, MD Date of Birth: 01-18-1951 CSN: [id number] Age: 66 Admit Type: Outpatient Procedure:                Colonoscopy Indications:              Last colonoscopy: 1998, Hematochezia Providers:                Sharon Mt. Delson Dulworth MD, MD, Zenon Mayo, RN,                            Alan Mulder, Technician Referring MD:              Medicines:                Monitored Anesthesia Care Complications:            No immediate complications. Estimated Blood Loss:     Estimated blood loss was minimal. Procedure:                Pre-Anesthesia Assessment:                           - The risks and benefits of the procedure and the                            sedation options and risks were discussed with the                            patient. All questions were answered and informed                            consent was obtained.                           - Patient identification and proposed procedure                            were verified prior to the procedure by the                            physician, the nurse and the anesthetist. The                            procedure was verified in the pre-procedure area in                            the endoscopy suite.                           - ASA Grade Assessment: II - A patient with mild                            systemic disease.                           - The anesthesia plan was to use monitored  anesthesia care (MAC).                           After obtaining informed consent, the colonoscope                            was passed under direct vision. Throughout the                            procedure, the patient's blood pressure, pulse, and                            oxygen saturations were monitored continuously. The                            Colonoscope was introduced  through the anus and                            advanced to the the cecum, identified by                            appendiceal orifice and ileocecal valve. The                            colonoscopy was somewhat difficult due to a                            tortuous colon. The patient tolerated the procedure                            well. The quality of the bowel preparation was                            adequate. Scope In: 9:25:35 AM Scope Out: 9:54:15 AM Scope Withdrawal Time: 0 hours 14 minutes 45 seconds  Total Procedure Duration: 0 hours 28 minutes 40 seconds  Findings:      The perianal exam findings include non-thrombosed external hemorrhoids       and non-thrombosed internal hemorrhoids.      A 2 mm polyp was found in the distal sigmoid colon. The polyp was       sessile. The polyp was removed with a jumbo cold forceps. Resection and       retrieval were complete. Verification of patient identification for the       specimen was done by the physician, nurse and technician using the       patient's name, birth date and medical record number. Estimated blood       loss was minimal.      Multiple small-mouthed diverticula were found in the sigmoid colon and       hepatic flexure.      The exam was otherwise without abnormality on direct and retroflexion       views. Impression:               - Non-thrombosed external hemorrhoids and  non-thrombosed internal hemorrhoids found on                            perianal exam.                           - One 2 mm polyp in the distal sigmoid colon,                            removed with a jumbo cold forceps. Resected and                            retrieved.                           - Diverticulosis in the sigmoid colon and at the                            hepatic flexure.                           - The examination was otherwise normal on direct                            and retroflexion  views. Moderate Sedation:      N/A- Per Anesthesia Care Recommendation:           - Written discharge instructions were provided to                            the patient.                           - The signs and symptoms of potential delayed                            complications were discussed with the patient.                           - Patient has a contact number available for                            emergencies.                           - Return to normal activities tomorrow.                           - High fiber diet indefinitely.                           - Continue present medications.                           - Await pathology results.                           - Repeat  colonoscopy in 5 years for surveillance.                           - Return to primary care physician at appointment                            to be scheduled. Procedure Code(s):        --- Professional ---                           [phone], Colonoscopy, flexible; with biopsy, single                            or multiple Diagnosis Code(s):        --- Professional ---                           D12.5, Benign neoplasm of sigmoid colon                           K64.4, Residual hemorrhoidal skin tags                           K64.8, Other hemorrhoids                           K92.1, Melena (includes Hematochezia)                           K57.30, Diverticulosis of large intestine without                            perforation or abscess without bleeding CPT copyright 2016 American Medical Association. All rights reserved. The codes documented in this report are preliminary and upon coder review may  be revised to meet current compliance requirements. Nadeen Landau, MD Ileana Roup MD, MD 03/07/2017 10:10:35 AM This report has been signed electronically. Number of Addenda: 0

## 2017-03-07 NOTE — Discharge Instructions (Signed)

## 2017-03-07 NOTE — Anesthesia Postprocedure Evaluation (Signed)
Anesthesia Post Note  Patient: Desiree Pittman  Procedure(s) Performed: COLONOSCOPY WITH PROPOFOL (N/A )     Patient location during evaluation: PACU Anesthesia Type: MAC Level of consciousness: awake and alert Pain management: pain level controlled Vital Signs Assessment: post-procedure vital signs reviewed and stable Respiratory status: spontaneous breathing Cardiovascular status: stable Anesthetic complications: no    Last Vitals:  Vitals:   03/07/17 1005 03/07/17 1010  BP: (!) 142/71 116/85  Pulse: 68 66  Resp: 19 19  Temp:    SpO2: 100% 99%    Last Pain:  Vitals:   03/07/17 0824  TempSrc: Oral                 Nolon Nations

## 2017-03-07 NOTE — Anesthesia Preprocedure Evaluation (Signed)
Anesthesia Evaluation  Patient identified by MRN, date of birth, ID band Patient awake    Reviewed: Allergy & Precautions, H&P , NPO status , Patient's Chart, lab work & pertinent test results  History of Anesthesia Complications (+) PONV and history of anesthetic complications  Airway Mallampati: I  TM Distance: >3 FB Neck ROM: Full    Dental  (+) Dental Advisory Given, Teeth Intact   Pulmonary neg pulmonary ROS,    Pulmonary exam normal breath sounds clear to auscultation       Cardiovascular hypertension, Pt. on medications (-) Past MI and (-) CHF Normal cardiovascular exam Rhythm:Regular Rate:Normal     Neuro/Psych negative neurological ROS  negative psych ROS   GI/Hepatic negative GI ROS, Neg liver ROS,   Endo/Other  diabetes  Renal/GU negative Renal ROS     Musculoskeletal negative musculoskeletal ROS (+) Arthritis , Osteoarthritis,    Abdominal   Peds  Hematology  (+) anemia ,   Anesthesia Other Findings   Reproductive/Obstetrics                             Anesthesia Physical  Anesthesia Plan  ASA: II  Anesthesia Plan: MAC   Post-op Pain Management:    Induction: Intravenous  PONV Risk Score and Plan: 3 and Ondansetron, Propofol infusion and Treatment may vary due to age or medical condition  Airway Management Planned:   Additional Equipment:   Intra-op Plan:   Post-operative Plan:   Informed Consent: I have reviewed the patients History and Physical, chart, labs and discussed the procedure including the risks, benefits and alternatives for the proposed anesthesia with the patient or authorized representative who has indicated his/her understanding and acceptance.   Dental advisory given  Plan Discussed with: CRNA  Anesthesia Plan Comments:         Anesthesia Quick Evaluation

## 2017-03-08 ENCOUNTER — Encounter (HOSPITAL_COMMUNITY)
Admission: RE | Disposition: A | Discharge status: Home / Self Care | Payer: Self-pay | Source: Ambulatory Visit | Attending: Surgery

## 2017-03-08 ENCOUNTER — Ambulatory Visit (HOSPITAL_COMMUNITY)
Admission: RE | Admit: 2017-03-08 | Discharge: 2017-03-08 | Disposition: A | Discharge status: Home / Self Care | Payer: Medicare Other | Source: Ambulatory Visit | Attending: Surgery | Admitting: Surgery

## 2017-03-08 ENCOUNTER — Encounter (HOSPITAL_COMMUNITY): Payer: Self-pay | Admitting: Surgery

## 2017-03-08 DIAGNOSIS — K6289 Other specified diseases of anus and rectum: Secondary | ICD-10-CM | POA: Insufficient documentation

## 2017-03-08 DIAGNOSIS — K5730 Diverticulosis of large intestine without perforation or abscess without bleeding: Secondary | ICD-10-CM | POA: Diagnosis not present

## 2017-03-08 DIAGNOSIS — K622 Anal prolapse: Secondary | ICD-10-CM | POA: Diagnosis not present

## 2017-03-08 DIAGNOSIS — K635 Polyp of colon: Secondary | ICD-10-CM | POA: Diagnosis not present

## 2017-03-08 HISTORY — PX: ANAL RECTAL MANOMETRY: SHX6358

## 2017-03-08 SURGERY — MANOMETRY, ANORECTAL
Anesthesia: Moderate Sedation

## 2017-03-08 NOTE — Progress Notes (Signed)
Anorectal manometry done per protocol.  Patient tolerated procedure well, w/o complications.  Dr. Marcello Moores to be notified today of study.    Laverta Baltimore, RN

## 2017-03-22 DIAGNOSIS — R198 Other specified symptoms and signs involving the digestive system and abdomen: Secondary | ICD-10-CM | POA: Diagnosis not present

## 2017-04-12 ENCOUNTER — Encounter: Payer: Self-pay | Admitting: Internal Medicine

## 2017-04-12 ENCOUNTER — Ambulatory Visit (INDEPENDENT_AMBULATORY_CARE_PROVIDER_SITE_OTHER): Payer: Medicare Other | Admitting: Internal Medicine

## 2017-04-12 VITALS — BP 148/84 | HR 86 | Temp 98.0°F | Resp 16 | Ht 62.0 in | Wt 125.1 lb

## 2017-04-12 DIAGNOSIS — F1010 Alcohol abuse, uncomplicated: Secondary | ICD-10-CM | POA: Insufficient documentation

## 2017-04-12 DIAGNOSIS — F320 Major depressive disorder, single episode, mild: Secondary | ICD-10-CM

## 2017-04-12 DIAGNOSIS — Z13818 Encounter for screening for other digestive system disorders: Secondary | ICD-10-CM

## 2017-04-12 DIAGNOSIS — L304 Erythema intertrigo: Secondary | ICD-10-CM

## 2017-04-12 DIAGNOSIS — G4700 Insomnia, unspecified: Secondary | ICD-10-CM

## 2017-04-12 DIAGNOSIS — I10 Essential (primary) hypertension: Secondary | ICD-10-CM | POA: Insufficient documentation

## 2017-04-12 DIAGNOSIS — B351 Tinea unguium: Secondary | ICD-10-CM | POA: Diagnosis not present

## 2017-04-12 DIAGNOSIS — E119 Type 2 diabetes mellitus without complications: Secondary | ICD-10-CM | POA: Insufficient documentation

## 2017-04-12 DIAGNOSIS — L309 Dermatitis, unspecified: Secondary | ICD-10-CM | POA: Insufficient documentation

## 2017-04-12 DIAGNOSIS — M25511 Pain in right shoulder: Secondary | ICD-10-CM | POA: Insufficient documentation

## 2017-04-12 DIAGNOSIS — Z1329 Encounter for screening for other suspected endocrine disorder: Secondary | ICD-10-CM

## 2017-04-12 DIAGNOSIS — Z23 Encounter for immunization: Secondary | ICD-10-CM

## 2017-04-12 DIAGNOSIS — F419 Anxiety disorder, unspecified: Secondary | ICD-10-CM | POA: Insufficient documentation

## 2017-04-12 DIAGNOSIS — R011 Cardiac murmur, unspecified: Secondary | ICD-10-CM | POA: Diagnosis not present

## 2017-04-12 DIAGNOSIS — Z1231 Encounter for screening mammogram for malignant neoplasm of breast: Secondary | ICD-10-CM

## 2017-04-12 LAB — URINALYSIS, ROUTINE W REFLEX MICROSCOPIC
BILIRUBIN URINE: NEGATIVE
HGB URINE DIPSTICK: NEGATIVE
KETONES UR: NEGATIVE
Nitrite: NEGATIVE
SPECIFIC GRAVITY, URINE: 1.02 (ref 1.000–1.030)
Total Protein, Urine: NEGATIVE
UROBILINOGEN UA: 0.2 (ref 0.0–1.0)
Urine Glucose: NEGATIVE
pH: 6 (ref 5.0–8.0)

## 2017-04-12 LAB — COMPREHENSIVE METABOLIC PANEL
ALBUMIN: 4.4 g/dL (ref 3.5–5.2)
ALT: 15 U/L (ref 0–35)
AST: 22 U/L (ref 0–37)
Alkaline Phosphatase: 63 U/L (ref 39–117)
BUN: 16 mg/dL (ref 6–23)
CHLORIDE: 102 meq/L (ref 96–112)
CO2: 30 mEq/L (ref 19–32)
Calcium: 10.2 mg/dL (ref 8.4–10.5)
Creatinine, Ser: 0.66 mg/dL (ref 0.40–1.20)
GFR: 95.46 mL/min (ref >60.00)
Glucose, Bld: 99 mg/dL (ref 70–99)
POTASSIUM: 3.8 meq/L (ref 3.5–5.1)
SODIUM: 140 meq/L (ref 135–145)
Total Bilirubin: 0.6 mg/dL (ref 0.2–1.2)
Total Protein: 6.9 g/dL (ref 6.0–8.3)

## 2017-04-12 LAB — T4, FREE: Free T4: 0.75 ng/dL (ref 0.60–1.60)

## 2017-04-12 LAB — TSH: TSH: 1.68 u[IU]/mL (ref 0.35–4.50)

## 2017-04-12 LAB — CBC
HEMATOCRIT: 43.6 % (ref 36.0–46.0)
HEMOGLOBIN: 14.4 g/dL (ref 12.0–15.0)
MCHC: 33.1 g/dL (ref 30.0–36.0)
MCV: 99.5 fl (ref 78.0–100.0)
Platelets: 235 10*3/uL (ref 150.0–400.0)
RBC: 4.39 Mil/uL (ref 3.87–5.11)
RDW: 13.5 % (ref 11.5–15.5)
WBC: 5.9 10*3/uL (ref 4.0–10.5)

## 2017-04-12 LAB — HEMOGLOBIN A1C: HEMOGLOBIN A1C: 5.6 % (ref 4.6–6.5)

## 2017-04-12 MED ORDER — TRIAMCINOLONE ACETONIDE 0.1 % EX CREA: 1.0000 "application " | TOPICAL_CREAM | Freq: Two times a day (BID) | CUTANEOUS | 0 refills | Status: DC | PRN

## 2017-04-12 MED ORDER — PNEUMOCOCCAL 13-VAL CONJ VACC IM SUSP: 0.5000 mL | Freq: Once | INTRAMUSCULAR | 0 refills | Status: AC

## 2017-04-12 MED ORDER — NIFEDIPINE ER 30 MG PO TB24: 30.0000 mg | ORAL_TABLET | Freq: Every day | ORAL | 0 refills | Status: DC

## 2017-04-12 MED ORDER — SERTRALINE HCL 50 MG PO TABS: 50.0000 mg | ORAL_TABLET | Freq: Every day | ORAL | 2 refills | Status: DC

## 2017-04-12 MED ORDER — LOSARTAN POTASSIUM-HCTZ 50-12.5 MG PO TABS: 1.0000 | ORAL_TABLET | Freq: Every morning | ORAL | 0 refills | Status: DC

## 2017-04-12 NOTE — Patient Instructions (Addendum)
Please follow up in 6 weeks  We will refer you for mammogram 05/2017  Try Zeasorb AF Powder in bra and skin folds Over the counter  Moisturize with Cetaphil or Cerave Cream  Try zoloft 50 mg in am for mood Please get prevnar vaccine at your pharmacy  Take care   Major Depressive Disorder, Adult Major depressive disorder (MDD) is a mental health condition. MDD often makes you feel sad, hopeless, or helpless. MDD can also cause symptoms in your body. MDD can affect your:  Work.  School.  Relationships.  Other normal activities.  MDD can range from mild to very bad. It may occur once (single episode MDD). It can also occur many times (recurrent MDD). The main symptoms of MDD often include:  Feeling sad, depressed, or irritable most of the time.  Loss of interest.  MDD symptoms also include:  Sleeping too much or too little.  Eating too much or too little.  A change in your weight.  Feeling tired (fatigue) or having low energy.  Feeling worthless.  Feeling guilty.  Trouble making decisions.  Trouble thinking clearly.  Thoughts of suicide or harming others.  Feeling weak.  Feeling agitated.  Keeping yourself from being around other people (isolation).  Follow these instructions at home: Activity  Do these things as told by your doctor: ? Go back to your normal activities. ? Exercise regularly. ? Spend time outdoors. Alcohol  Talk with your doctor about how alcohol can affect your antidepressant medicines.  Do not drink alcohol. Or, limit how much alcohol you drink. ? This means no more than 1 drink a day for nonpregnant women and 2 drinks a day for men. One drink equals one of these:  12 oz of beer.  5 oz of wine.  1 oz of hard liquor. General instructions  Take over-the-counter and prescription medicines only as told by your doctor.  Eat a healthy diet.  Get plenty of sleep.  Find activities that you enjoy. Make time to do them.  Think  about joining a support group. Your doctor may be able to suggest a group for you.  Keep all follow-up visits as told by your doctor. This is important. Where to find more information:  [employer] on Mental Illness: ? www.nami.[place]: ? https://carter.com/  National Suicide Prevention Lifeline: ? (906)348-4665. This is free, 24-hour help. Contact a doctor if:  Your symptoms get worse.  You have new symptoms. Get help right away if:  You self-harm.  You see, hear, taste, smell, or feel things that are not present (hallucinate). If you ever feel like you may hurt yourself or others, or have thoughts about taking your own life, get help right away. You can go to your nearest emergency department or call:  Your local emergency services (911 in the [place]  A suicide crisis helpline, such as the National Suicide Prevention Lifeline: ? 418-790-3625. This is open 24 hours a day.  This information is not intended to replace advice given to you by your health care provider. Make sure you discuss any questions you have with your health care provider. Document Released: 04/11/2015 Document Revised: 01/15/2016 Document Reviewed: 01/15/2016 Elsevier Interactive Patient Education  2017 [employer].  Fungal Nail Infection Fungal nail infection is a common fungal infection of the toenails or fingernails. This condition affects toenails more often than fingernails. More than one nail may be infected. The condition can be passed from person to person (is  contagious). What are the causes? This condition is caused by a fungus. Several types of funguses can cause the infection. These funguses are common in moist and warm areas. If your hands or feet come into contact with the fungus, it may get into a crack in your fingernail or toenail and cause the infection. What increases the risk? The following factors may make you more likely to develop this  condition:  Being female.  Having diabetes.  Being of older age.  Living with someone who has the fungus.  Walking barefoot in areas where the fungus thrives, such as showers or locker rooms.  Having poor circulation.  Wearing shoes and socks that cause your feet to sweat.  Having athlete's foot.  Having a nail injury or history of a recent nail surgery.  Having psoriasis.  Having a weak body defense system (immune system).  What are the signs or symptoms? Symptoms of this condition include:  A pale spot on the nail.  Thickening of the nail.  A nail that becomes yellow or brown.  A brittle or ragged nail edge.  A crumbling nail.  A nail that has lifted away from the nail bed.  How is this diagnosed? This condition is diagnosed with a physical exam. Your health care provider may take a scraping or clipping from your nail to test for the fungus. How is this treated? Mild infections do not need treatment. If you have significant nail changes, treatment may include:  Oral antifungal medicines. You may need to take the medicine for several weeks or several months, and you may not see the results for a long time. These medicines can cause side effects. Ask your health care provider what problems to watch for.  Antifungal nail polish and nail cream. These may be used along with oral antifungal medicines.  Laser treatment of the nail.  Surgery to remove the nail. This may be needed for the most severe infections.  Treatment takes a long time, and the infection may come back. Follow these instructions at home: Medicines  Take or apply over-the-counter and prescription medicines only as told by your health care provider.  Ask your health care provider about using over-the-counter mentholated ointment on your nails. Lifestyle   Do not share personal items, such as towels or nail clippers.  Trim your nails often.  Wash and dry your hands and feet every day.  Wear  absorbent socks, and change your socks frequently.  Wear shoes that allow air to circulate, such as sandals or canvas tennis shoes. Throw out old shoes.  Wear rubber gloves if you are working with your hands in wet areas.  Do not walk barefoot in shower rooms or locker rooms.  Do not use a nail salon that does not use clean instruments.  Do not use artificial nails. General instructions  Keep all follow-up visits as told by your health care provider. This is important.  Use antifungal foot powder on your feet and in your shoes. Contact a health care provider if: Your infection is not getting better or it is getting worse after several months. This information is not intended to replace advice given to you by your health care provider. Make sure you discuss any questions you have with your health care provider. Document Released: 04/27/2000 Document Revised: 10/06/2015 Document Reviewed: 11/01/2014 Elsevier Interactive Patient Education  2018 [employer].

## 2017-04-12 NOTE — Progress Notes (Signed)
Chief Complaint  Patient presents with  . Establish Care  . Annual Exam   New patient  1. Anxiety/Depression/Stress/insomnia trigger was suicide of son in 2017. She reports she found her son with self inflicted wound and she cant stop thinking about it and it makes her sad. She did do hospice counseling x 3-4 months but stopped going b/c she had to babysit her grandsons on Tuesdays when it was offered. She reports she has been drinking 1 bottle of wine most nights x 1.5 years. She does think she needs to cut back, her husband gets annoyed but he also drinks beer. Trigger to drinking is that she is home alone since son died b/c husband is truck Geophysicist/field seismologist.  She does not feel guilty about drinking or wake up drinking. 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  2. C/o rash to back itchy using Elocon 0.1 mg qd but not helpful and out.  3. C/o right shoulder pain with moving and shoulder is popping. Pain and popping intermittent. She feels like trigger is when she moved the wrong way  4. C/o toenail fungus left foot 3rd toes (last 3) she does get pedicures. Disc'ed tx options  5. H/o HTN uncontrolled she has been out of nifedipine but had ARB/HCTZ today 6. DM 2 reports last A1C 7.5 and Metformin gave her diarrhea Calmoseptine OTC helps and helped bleeding hemorrhoids. Off metformin 2000 mg x 1 month     Review of Systems  Constitutional:       Down 25 lbs over the last 1.5 years thinks b/c she skips dinner and drinks instead   Respiratory: Negative for shortness of breath.   Cardiovascular: Negative for chest pain.  Gastrointestinal: Positive for blood in stool and diarrhea. Negative for abdominal pain.  Genitourinary:       +leakage of urine  Musculoskeletal: Positive for joint pain.       +muscle cramps  Skin: Positive for rash.       +itchy bumps to back   Neurological: Negative for headaches.  Endo/Heme/Allergies:  Positive for environmental allergies.  Psychiatric/Behavioral: Positive for depression. The patient is nervous/anxious.        +stress    Past Medical History:  Diagnosis Date  . Abnormal Pap smear of cervix   . Allergy   . Anxiety   . Arthritis   . Blood in stool    colonoscopy 02/2017  . Chicken pox   . Colon polyps   . Depression   . Diabetes mellitus without complication ([place])    type 2  . Diverticulitis   . GERD (gastroesophageal reflux disease)    h/o ulcers ? GI   . Glaucoma    borderline   . Hyperlipidemia   . Hypertension   . PONV (postoperative nausea and vomiting)    pull tubes out,etc.   Past Surgical History:  Procedure Laterality Date  . ANAL RECTAL MANOMETRY N/A 03/08/2017   Procedure: ANO RECTAL MANOMETRY;  Surgeon: Ileana Roup, MD;  Location: [facility];  Service: General;  Laterality: N/A;  . CARPAL TUNNEL RELEASE    . COLONOSCOPY WITH PROPOFOL N/A 03/07/2017   Procedure: COLONOSCOPY WITH PROPOFOL;  Surgeon: Ileana Roup, MD;  Location: [facility];  Service: General;  Laterality: N/A;  . finergy surgery     cut finger sewn back on  . HAND SURGERY     ring finger left  hand   . JOINT REPLACEMENT     left  hip  . TOTAL HIP ARTHROPLASTY  04/23/2012   Procedure: TOTAL HIP ARTHROPLASTY ANTERIOR APPROACH;  Surgeon: Gearlean Alf, MD;  Location: [facility];  Service: Orthopedics;  Laterality: Right;  . TOTAL HIP REVISION  04/23/2012   Procedure: TOTAL HIP REVISION;  Surgeon: Gearlean Alf, MD;  Location: [facility];  Service: Orthopedics;  Laterality: Left;   Family History  Problem Relation Age of Onset  . Hypertension Mother   . Arthritis Mother   . Heart disease Mother   . Cancer Father        lung died age 60   . Alcohol abuse Son   . Cancer Other        colon caner    Social History   Socioeconomic History  . Marital status: Married    Spouse name: Not on file  . Number of children: Not on file  . Years of education: Not on  file  . Highest education level: Not on file  Social Needs  . Financial resource strain: Not on file  . Food insecurity - worry: Not on file  . Food insecurity - inability: Not on file  . Transportation needs - medical: Not on file  . Transportation needs - non-medical: Not on file  Occupational History  . Not on file  Tobacco Use  . Smoking status: Never Smoker  . Smokeless tobacco: Never Used  Substance and Sexual Activity  . Alcohol use: Yes    Alcohol/week: 3.6 - 6.0 oz    Types: 6 - 10 Standard drinks or equivalent per week    Comment: 1 bottle qhs most nights x 1.5 years prior to 03/2017  . Drug use: No  . Sexual activity: Yes  Other Topics Concern  . Not on file  Social History Narrative   Lives with her husband.   Her 85 year old son lived with them until 25-Aug-2015, when she found him dead in their home of a self-inflicted GSW/suicide    1 living daughter    2 grandsons    Works as Copywriter, advertising     Current Meds  Medication Sig  . Ascorbic Acid (VITAMIN C) POWD Take by mouth daily.  Marland Kitchen losartan-hydrochlorothiazide (HYZAAR) 50-12.5 MG tablet Take 1 tablet by mouth every morning.  . meloxicam (MOBIC) 7.5 MG tablet Take 7.5 mg by mouth daily as needed for pain.   . Menthol-Zinc Oxide (CALMOSEPTINE) 0.44-20.6 % OINT Apply topically as needed.  . methylcellulose (CITRUCEL) oral powder Take by mouth daily.  . Multiple Vitamins-Minerals (MULTIVITAMIN WITH MINERALS) tablet Take 1 tablet by mouth daily.   Marland Kitchen NIFEdipine (PROCARDIA-XL/ADALAT CC) 30 MG 24 hr tablet Take 1 tablet (30 mg total) by mouth daily.  . Polyethylene Glycol 3350 (MIRALAX PO) Take by mouth daily.  . traMADol (ULTRAM) 50 MG tablet TAKE 1 OR 2 TABLETS BY MOUTH EVERY 6 TO 8 HOURS AS NEEDED FOR PAIN  . [DISCONTINUED] losartan-hydrochlorothiazide (HYZAAR) 50-12.5 MG per tablet Take 1 tablet by mouth every morning.  . [DISCONTINUED] NIFEdipine (PROCARDIA-XL/ADALAT CC) 30 MG 24 hr tablet Take 30 mg by mouth daily.     Allergies  Allergen Reactions  . Metformin And Related     Diarrhea   . Propofol Nausea And Vomiting    Can do with nausea meds   Recent Results (from the past 2158-08-25 hour(s))  Glucose, capillary     Status: None   Collection Time: 03/07/17  8:40 AM  Result Value Ref Range   Glucose-Capillary 99 65 - 99 mg/dL   Objective  Body mass index is 22.89 kg/m. Wt Readings from Last 3 Encounters:  04/12/17 125 lb 2 oz (56.8 kg)  03/07/17 125 lb (56.7 kg)  10/21/15 127 lb (57.6 kg)   Temp Readings from Last 3 Encounters:  04/12/17 98 F (36.7 C) (Oral)  03/07/17 97.8 F (36.6 C) (Oral)  10/21/15 97.9 F (36.6 C) (Oral)   BP Readings from Last 3 Encounters:  04/12/17 (!) 148/84  03/07/17 116/85  10/21/15 122/72   Pulse Readings from Last 3 Encounters:  04/12/17 86  03/07/17 66  10/21/15 75   Pulse oximetry on room air is 98% Physical Exam  Constitutional: She is oriented to person, place, and time and well-developed, well-nourished, and in no distress.  HENT:  Head: Normocephalic and atraumatic.  Mouth/Throat: Oropharynx is clear and moist and mucous membranes are normal.  Eyes: Pupils are equal, round, and reactive to light.  Cardiovascular: Normal rate and regular rhythm.  Murmur heard. Neg leg edema b/l   Pulmonary/Chest: Effort normal and breath sounds normal. Right breast exhibits no inverted nipple, no mass, no nipple discharge, no skin change and no tenderness. Left breast exhibits no inverted nipple, no mass, no nipple discharge, no skin change and no tenderness.  No masses in axilla   Abdominal: Soft. Bowel sounds are normal. There is no tenderness.  Neurological: She is alert and oriented to person, place, and time. Gait normal. Gait normal.  Skin: Skin is warm and dry.     Psychiatric: Mood, memory, affect and judgment normal.  Nursing note and vitals reviewed.  Assessment   1. Anxiety/depression/stress/insomnia PHQ 9 5 today could be related life  stressors and substance abuse I.e etoh  2. Alcohol abuse  3. Eczema  4. Right shoulder pain  5. Onychomycosis  6. HTN uncontrolled  7. DM 2  8. Intertrigo 9. HM 10. Cardiac murmur  Plan  1.  rec reduce alcohol intake  Start Zoloft 50 mg qam  Consider grief counseling in the future again vs therapy vs psych F/u in 6 weeks  Consider melatonin 3-6 mg qhs sleep  2. rec cessation 2/4 CAGE questions + rec cut back and stop etoh  3. D/c elocon and change TMC 0.1 bid  Moisturize with cetaphil/Cerave  Try Zeasorb AF intertrigo areas  4.  Declines Xray for now  5.  Reviewed topicals vs oral tx options  Do CMET 1st  6.  Refilled meds #90 day supply  7.  Check labs today CMET, CBC, TSH, T4, UA, A1C, hep C Per pt hep B titer wnl previously  Lipid future when fasting  Declines other STD testing  8.  Zeasorb AF powder  9.  Had flu shot Rx prevnar will need pna 23 in 1 year  Disc shingrix perpt had old shingrix vaccine  Tdap had ? When had   Pap get records [facility]  mammo refer for 05/2017 per pt preference   Colonoscopy had 03/07/17 reviewed 2 mm polpy diverticula, Ext and Int Hemorrhoids Dr. Annye English f/u in 5 years   rec alcohol cessation  Declines STD testing   Consider DEXA in future   Monitor weight pt declines w/u wt loss for now and thinks related to cutting back food w/in 1.5 years  Dropped 150s to 125.2.    10 consider w/u in future I.e echo  Provider: Dr. Olivia Mackie McLean-Scocuzza

## 2017-04-13 LAB — HEPATITIS C ANTIBODY
HEP C AB: NONREACTIVE
SIGNAL TO CUT-OFF: 0 (ref <1.00)

## 2017-04-16 ENCOUNTER — Other Ambulatory Visit: Payer: Self-pay | Admitting: Internal Medicine

## 2017-04-16 DIAGNOSIS — B351 Tinea unguium: Secondary | ICD-10-CM

## 2017-04-16 MED ORDER — TERBINAFINE HCL 250 MG PO TABS: 250.0000 mg | ORAL_TABLET | Freq: Every day | ORAL | 0 refills | Status: DC

## 2017-04-18 ENCOUNTER — Telehealth: Payer: Self-pay | Admitting: Internal Medicine

## 2017-04-18 DIAGNOSIS — Z1231 Encounter for screening mammogram for malignant neoplasm of breast: Secondary | ICD-10-CM

## 2017-04-18 NOTE — Telephone Encounter (Signed)
Need new order to read 3D [facility] IMG [id number] Please and thank you!

## 2017-04-18 NOTE — Telephone Encounter (Signed)
Correction IMG [id number]. Thank you! °

## 2017-05-24 ENCOUNTER — Ambulatory Visit: Payer: Medicare Other | Admitting: Internal Medicine

## 2017-05-29 DIAGNOSIS — M25511 Pain in right shoulder: Secondary | ICD-10-CM | POA: Diagnosis not present

## 2017-06-05 ENCOUNTER — Ambulatory Visit
Admission: RE | Admit: 2017-06-05 | Discharge: 2017-06-05 | Disposition: A | Discharge status: Home / Self Care | Payer: Medicare Other | Source: Ambulatory Visit | Attending: Internal Medicine | Admitting: Internal Medicine

## 2017-06-05 DIAGNOSIS — Z1231 Encounter for screening mammogram for malignant neoplasm of breast: Secondary | ICD-10-CM | POA: Diagnosis not present

## 2017-06-06 ENCOUNTER — Other Ambulatory Visit: Payer: Self-pay | Admitting: Internal Medicine

## 2017-06-06 DIAGNOSIS — R928 Other abnormal and inconclusive findings on diagnostic imaging of breast: Secondary | ICD-10-CM

## 2017-06-07 DIAGNOSIS — M25511 Pain in right shoulder: Secondary | ICD-10-CM | POA: Diagnosis not present

## 2017-06-14 ENCOUNTER — Ambulatory Visit
Admission: RE | Admit: 2017-06-14 | Discharge: 2017-06-14 | Disposition: A | Discharge status: Home / Self Care | Payer: Medicare Other | Source: Ambulatory Visit | Attending: Internal Medicine | Admitting: Internal Medicine

## 2017-06-14 DIAGNOSIS — R928 Other abnormal and inconclusive findings on diagnostic imaging of breast: Secondary | ICD-10-CM

## 2017-06-14 DIAGNOSIS — R922 Inconclusive mammogram: Secondary | ICD-10-CM | POA: Diagnosis not present

## 2017-06-14 DIAGNOSIS — N6489 Other specified disorders of breast: Secondary | ICD-10-CM | POA: Diagnosis not present

## 2017-06-19 DIAGNOSIS — M75101 Unspecified rotator cuff tear or rupture of right shoulder, not specified as traumatic: Secondary | ICD-10-CM | POA: Diagnosis not present

## 2017-06-19 DIAGNOSIS — M19011 Primary osteoarthritis, right shoulder: Secondary | ICD-10-CM | POA: Diagnosis not present

## 2017-06-24 ENCOUNTER — Ambulatory Visit (INDEPENDENT_AMBULATORY_CARE_PROVIDER_SITE_OTHER): Payer: Medicare Other | Admitting: Internal Medicine

## 2017-06-24 ENCOUNTER — Encounter: Payer: Self-pay | Admitting: Internal Medicine

## 2017-06-24 VITALS — BP 140/78 | HR 68 | Temp 98.0°F | Ht 62.0 in | Wt 126.6 lb

## 2017-06-24 DIAGNOSIS — F1010 Alcohol abuse, uncomplicated: Secondary | ICD-10-CM

## 2017-06-24 DIAGNOSIS — M25511 Pain in right shoulder: Secondary | ICD-10-CM | POA: Diagnosis not present

## 2017-06-24 DIAGNOSIS — Z1283 Encounter for screening for malignant neoplasm of skin: Secondary | ICD-10-CM

## 2017-06-24 DIAGNOSIS — Z5181 Encounter for therapeutic drug level monitoring: Secondary | ICD-10-CM | POA: Diagnosis not present

## 2017-06-24 DIAGNOSIS — Z23 Encounter for immunization: Secondary | ICD-10-CM | POA: Diagnosis not present

## 2017-06-24 DIAGNOSIS — B351 Tinea unguium: Secondary | ICD-10-CM

## 2017-06-24 DIAGNOSIS — I10 Essential (primary) hypertension: Secondary | ICD-10-CM | POA: Diagnosis not present

## 2017-06-24 DIAGNOSIS — F320 Major depressive disorder, single episode, mild: Secondary | ICD-10-CM

## 2017-06-24 DIAGNOSIS — F419 Anxiety disorder, unspecified: Secondary | ICD-10-CM | POA: Diagnosis not present

## 2017-06-24 DIAGNOSIS — L309 Dermatitis, unspecified: Secondary | ICD-10-CM

## 2017-06-24 LAB — HEPATIC FUNCTION PANEL
ALBUMIN: 4.1 g/dL (ref 3.5–5.2)
ALT: 16 U/L (ref 0–35)
AST: 20 U/L (ref 0–37)
Alkaline Phosphatase: 54 U/L (ref 39–117)
BILIRUBIN TOTAL: 0.4 mg/dL (ref 0.2–1.2)
Bilirubin, Direct: 0.1 mg/dL (ref 0.0–0.3)
Total Protein: 6.7 g/dL (ref 6.0–8.3)

## 2017-06-24 MED ORDER — LOSARTAN POTASSIUM-HCTZ 50-12.5 MG PO TABS: 1.0000 | ORAL_TABLET | Freq: Every morning | ORAL | 1 refills | Status: DC

## 2017-06-24 NOTE — Progress Notes (Unsigned)
Pre visit review using our clinic review tool, if applicable. No additional management support is needed unless otherwise documented below in the visit note. 

## 2017-06-24 NOTE — Progress Notes (Signed)
Pre visit review using our clinic review tool, if applicable. No additional management support is needed unless otherwise documented below in the visit note. 

## 2017-06-24 NOTE — Patient Instructions (Addendum)
Follow up in 3-6 months  Referred to dermatology Dr. Delman Cheadle in [place]  Take care   DTaP Vaccine (Diphtheria, Tetanus, and Pertussis): What You Need to Know 1. Why get vaccinated? Diphtheria, tetanus, and pertussis are serious diseases caused by bacteria. Diphtheria and pertussis are spread from person to person. Tetanus enters the body through cuts or wounds. DIPHTHERIA causes a thick covering in the back of the throat.  It can lead to breathing problems, paralysis, heart failure, and even death.  TETANUS (Lockjaw) causes painful tightening of the muscles, usually all over the body.  It can lead to "locking" of the jaw so the victim cannot open his mouth or swallow. Tetanus leads to death in up to 2 out of 10 cases.  PERTUSSIS (Whooping Cough) causes coughing spells so bad that it is hard for infants to eat, drink, or breathe. These spells can last for weeks.  It can lead to pneumonia, seizures (jerking and staring spells), brain damage, and death.  Diphtheria, tetanus, and pertussis vaccine (DTaP) can help prevent these diseases. Most children who are vaccinated with DTaP will be protected throughout childhood. Many more children would get these diseases if we stopped vaccinating. DTaP is a safer version of an older vaccine called DTP. DTP is no longer used in the [place]. 2. Who should get DTaP vaccine and when? Children should get 5 doses of DTaP vaccine, one dose at each of the following ages:  2 months  4 months  6 months  15-18 months  4-6 years  DTaP may be given at the same time as other vaccines. 3. Some children should not get DTaP vaccine or should wait  Children with minor illnesses, such as a cold, may be vaccinated. But children who are moderately or severely ill should usually wait until they recover before getting DTaP vaccine.  Any child who had a life-threatening allergic reaction after a dose of DTaP should not get another dose.  Any child who  suffered a brain or nervous system disease within 7 days after a dose of DTaP should not get another dose.  Talk with your doctor if your child: ? had a seizure or collapsed after a dose of DTaP, ? cried non-stop for 3 hours or more after a dose of DTaP, ? had a fever over 105F after a dose of DTaP. Ask your doctor for more information. Some of these children should not get another dose of pertussis vaccine, but may get a vaccine without pertussis, called DT. 4. Older children and adults DTaP is not licensed for adolescents, adults, or children 24 years of age and older. But older people still need protection. A vaccine called Tdap is similar to DTaP. A single dose of Tdap is recommended for people 11 through 66 years of age. Another vaccine, called Td, protects against tetanus and diphtheria, but not pertussis. It is recommended every 10 years. There are separate Vaccine Information Statements for these vaccines. 5. What are the risks from DTaP vaccine? Getting diphtheria, tetanus, or pertussis disease is much riskier than getting DTaP vaccine. However, a vaccine, like any medicine, is capable of causing serious problems, such as severe allergic reactions. The risk of DTaP vaccine causing serious harm, or death, is extremely small. Mild problems (common)  Fever (up to about 1 child in 4)  Redness or swelling where the shot was given (up to about 1 child in 4)  Soreness or tenderness where the shot was given (up to about 1 child in  4) These problems occur more often after the 4th and 5th doses of the DTaP series than after earlier doses. Sometimes the 4th or 5th dose of DTaP vaccine is followed by swelling of the entire arm or leg in which the shot was given, lasting 1-7 days (up to about 1 child in 72). Other mild problems include:  Fussiness (up to about 1 child in 3)  Tiredness or poor appetite (up to about 1 child in 10)  Vomiting (up to about 1 child in 7) These problems generally  occur 1-3 days after the shot. Moderate problems (uncommon)  Seizure (jerking or staring) (about 1 child out of 14,000)  Non-stop crying, for 3 hours or more (up to about 1 child out of 1,000)  High fever, over 105F (about 1 child out of 16,000) Severe problems (very rare)  Serious allergic reaction (less than 1 out of a million doses)  Several other severe problems have been reported after DTaP vaccine. These include: ? Long-term seizures, coma, or lowered consciousness ? Permanent brain damage. These are so rare it is hard to tell if they are caused by the vaccine. Controlling fever is especially important for children who have had seizures, for any reason. It is also important if another family member has had seizures. You can reduce fever and pain by giving your child an aspirin-free pain reliever when the shot is given, and for the next 24 hours, following the package instructions. 6. What if there is a serious reaction? What should I look for? Look for anything that concerns you, such as signs of a severe allergic reaction, very high fever, or behavior changes. Signs of a severe allergic reaction can include hives, swelling of the face and throat, difficulty breathing, a fast heartbeat, dizziness, and weakness. These would start a few minutes to a few hours after the vaccination. What should I do?  If you think it is a severe allergic reaction or other emergency that can't wait, call 9-1-1 or get the person to the nearest hospital. Otherwise, call your doctor.  Afterward, the reaction should be reported to the Vaccine Adverse Event Reporting System (VAERS). Your doctor might file this report, or you can do it yourself through the VAERS web site at www.vaers.SamedayNews.es, or by calling (539)716-8215. ? VAERS is only for reporting reactions. They do not give medical advice. 7. The National Vaccine Injury Compensation Program The [employer] Vaccine Injury Compensation Program (VICP) is a  federal program that was created to compensate people who may have been injured by certain vaccines. Persons who believe they may have been injured by a vaccine can learn about the program and about filing a claim by calling 714 054 0074 or visiting the [employer] website at GoldCloset.com.ee. 8. How can I learn more?  Ask your doctor.  Call your local or state health department.  Contact the Centers for Disease Control and Prevention (CDC): ? Call 585-269-6725 (1-800-CDC-INFO) or ? Visit CDC's website at http://hunter.com/ CDC DTaP Vaccine (Diphtheria, Tetanus, and Pertussis) VIS (09/27/05) This information is not intended to replace advice given to you by your health care provider. Make sure you discuss any questions you have with your health care provider. Document Released: 02/25/2006 Document Revised: 01/19/2016 Document Reviewed: 01/19/2016 Elsevier Interactive Patient Education  2017 [place].  Hypertension Hypertension is another name for high blood pressure. High blood pressure forces your heart to work harder to pump blood. This can cause problems over time. There are two numbers in a blood pressure reading. There  is a top number (systolic) over a bottom number (diastolic). It is best to have a blood pressure below 120/80. Healthy choices can help lower your blood pressure. You may need medicine to help lower your blood pressure if:  Your blood pressure cannot be lowered with healthy choices.  Your blood pressure is higher than 130/80.  Follow these instructions at home: Eating and drinking  If directed, follow the DASH eating plan. This diet includes: ? Filling half of your plate at each meal with fruits and vegetables. ? Filling one quarter of your plate at each meal with whole grains. Whole grains include whole wheat pasta, brown rice, and whole grain bread. ? Eating or drinking low-fat dairy products, such as skim milk or low-fat yogurt. ? Filling one  quarter of your plate at each meal with low-fat (lean) proteins. Low-fat proteins include fish, skinless chicken, eggs, beans, and tofu. ? Avoiding fatty meat, cured and processed meat, or chicken with skin. ? Avoiding premade or processed food.  Eat less than 1,500 mg of salt (sodium) a day.  Limit alcohol use to no more than 1 drink a day for nonpregnant women and 2 drinks a day for men. One drink equals 12 oz of beer, 5 oz of wine, or 1 oz of hard liquor. Lifestyle  Work with your doctor to stay at a healthy weight or to lose weight. Ask your doctor what the best weight is for you.  Get at least 30 minutes of exercise that causes your heart to beat faster (aerobic exercise) most days of the week. This may include walking, swimming, or biking.  Get at least 30 minutes of exercise that strengthens your muscles (resistance exercise) at least 3 days a week. This may include lifting weights or pilates.  Do not use any products that contain nicotine or tobacco. This includes cigarettes and e-cigarettes. If you need help quitting, ask your doctor.  Check your blood pressure at home as told by your doctor.  Keep all follow-up visits as told by your doctor. This is important. Medicines  Take over-the-counter and prescription medicines only as told by your doctor. Follow directions carefully.  Do not skip doses of blood pressure medicine. The medicine does not work as well if you skip doses. Skipping doses also puts you at risk for problems.  Ask your doctor about side effects or reactions to medicines that you should watch for. Contact a doctor if:  You think you are having a reaction to the medicine you are taking.  You have headaches that keep coming back (recurring).  You feel dizzy.  You have swelling in your ankles.  You have trouble with your vision. Get help right away if:  You get a very bad headache.  You start to feel confused.  You feel weak or numb.  You feel  faint.  You get very bad pain in your: ? Chest. ? Belly (abdomen).  You throw up (vomit) more than once.  You have trouble breathing. Summary  Hypertension is another name for high blood pressure.  Making healthy choices can help lower blood pressure. If your blood pressure cannot be controlled with healthy choices, you may need to take medicine. This information is not intended to replace advice given to you by your health care provider. Make sure you discuss any questions you have with your health care provider. Document Released: 10/17/2007 Document Revised: 03/28/2016 Document Reviewed: 03/28/2016 Elsevier Interactive Patient Education  [employer].

## 2017-06-24 NOTE — Progress Notes (Addendum)
Chief Complaint  Patient presents with  . Follow-up   Follow up  1. HTN controlled on Losartan hctz 50-12.5 mg qd  2. etoh use cut back to 1-2 glasses of wine qhs  3. 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 Stressors at home husband quit his job w/o notice. Mood improved and zoloft 50 mg helped with sleep. She took x 30 days then stopped. Now having sleep issues though mood improved waking up at 2 am and 4 am. She did not try melatonin 4. Rash to right back improved She is not sure if latex vs shingles rash was painful but resolved now.   5. Right shoulder pain saw [facility] and brought in MRI 06/07/17 +rotator cuff tendinopathy +infraspinatus, and high grade partial thickness tear, also moderate to marker supraspinatus tendionosis, moderate long biceps tendinopathy, moderate to severe arthritis right shoulder s/p steroid injection doing well  6. Onychomycosis on lamisil since last visit will check lfts today    Review of Systems  Constitutional: Negative for weight loss.  HENT: Negative for hearing loss.   Respiratory: Negative for shortness of breath.   Cardiovascular: Negative for chest pain.  Gastrointestinal: Negative for abdominal pain.  Musculoskeletal: Negative for joint pain.  Skin:       +toenail fungus   Neurological: Negative for headaches.  Psychiatric/Behavioral: Negative for depression. The patient has insomnia.    Past Medical History:  Diagnosis Date  . Abnormal Pap smear of cervix   . Allergy   . Anxiety   . Arthritis   . Blood in stool    colonoscopy 02/2017  . Chicken pox   . Colon polyps   . Depression   . Diabetes mellitus without complication ([place])    type 2  . Diverticulitis   . GERD (gastroesophageal reflux disease)    h/o ulcers ? GI   . Glaucoma    borderline   . Hyperlipidemia   . Hypertension   . PONV (postoperative nausea and vomiting)    pull tubes out,etc.   Past Surgical History:  Procedure Laterality Date  .  ANAL RECTAL MANOMETRY N/A 03/08/2017   Procedure: ANO RECTAL MANOMETRY;  Surgeon: Ileana Roup, MD;  Location: [facility];  Service: General;  Laterality: N/A;  . CARPAL TUNNEL RELEASE    . COLONOSCOPY WITH PROPOFOL N/A 03/07/2017   Procedure: COLONOSCOPY WITH PROPOFOL;  Surgeon: Ileana Roup, MD;  Location: [facility];  Service: General;  Laterality: N/A;  . finergy surgery     cut finger sewn back on  . HAND SURGERY     ring finger left hand   . JOINT REPLACEMENT     left  hip  . TOTAL HIP ARTHROPLASTY  04/23/2012   Procedure: TOTAL HIP ARTHROPLASTY ANTERIOR APPROACH;  Surgeon: Gearlean Alf, MD;  Location: [facility];  Service: Orthopedics;  Laterality: Right;  . TOTAL HIP REVISION  04/23/2012   Procedure: TOTAL HIP REVISION;  Surgeon: Gearlean Alf, MD;  Location: [facility];  Service: Orthopedics;  Laterality: Left;   Family History  Problem Relation Age of Onset  . Hypertension Mother   . Arthritis Mother   . Heart disease Mother   . Cancer Father        lung died age 10   . Alcohol abuse Son   . Cancer Other        colon caner    Social History   Socioeconomic History  . Marital status: Married  Spouse name: Not on file  . Number of children: Not on file  . Years of education: Not on file  . Highest education level: Not on file  Social Needs  . Financial resource strain: Not on file  . Food insecurity - worry: Not on file  . Food insecurity - inability: Not on file  . Transportation needs - medical: Not on file  . Transportation needs - non-medical: Not on file  Occupational History  . Not on file  Tobacco Use  . Smoking status: Never Smoker  . Smokeless tobacco: Never Used  Substance and Sexual Activity  . Alcohol use: Yes    Alcohol/week: 3.6 - 6.0 oz    Types: 6 - 10 Standard drinks or equivalent per week    Comment: 1 bottle qhs most nights x 1.5 years prior to 03/2017  . Drug use: No  . Sexual activity: Yes  Other Topics Concern  .  Not on file  Social History Narrative   Lives with her husband.   Her 36 year old son lived with them until 2015/09/06, when she found him dead in their home of a self-inflicted GSW/suicide    1 living daughter    2 grandsons    Works as Copywriter, advertising     Current Meds  Medication Sig  . Ascorbic Acid (VITAMIN C) POWD Take by mouth daily.  Marland Kitchen losartan-hydrochlorothiazide (HYZAAR) 50-12.5 MG tablet Take 1 tablet by mouth every morning.  . meloxicam (MOBIC) 7.5 MG tablet Take 7.5 mg by mouth daily as needed for pain.   . Menthol-Zinc Oxide (CALMOSEPTINE) 0.44-20.6 % OINT Apply topically as needed.  . methylcellulose (CITRUCEL) oral powder Take by mouth daily.  . Multiple Vitamins-Minerals (MULTIVITAMIN WITH MINERALS) tablet Take 1 tablet by mouth daily.   Marland Kitchen NIFEdipine (PROCARDIA-XL/ADALAT CC) 30 MG 24 hr tablet Take 1 tablet (30 mg total) by mouth daily.  . Polyethylene Glycol 3350 (MIRALAX PO) Take by mouth daily.  . sertraline (ZOLOFT) 50 MG tablet Take 1 tablet (50 mg total) by mouth daily after breakfast.  . terbinafine (LAMISIL) 250 MG tablet Take 1 tablet (250 mg total) by mouth daily. X 3 months then stop  . traMADol (ULTRAM) 50 MG tablet TAKE 1 OR 2 TABLETS BY MOUTH EVERY 6 TO 8 HOURS AS NEEDED FOR PAIN  . triamcinolone cream (KENALOG) 0.1 % Apply 1 application topically 2 (two) times daily as needed.  . [DISCONTINUED] losartan-hydrochlorothiazide (HYZAAR) 50-12.5 MG tablet Take 1 tablet by mouth every morning.   Allergies  Allergen Reactions  . Metformin And Related     Diarrhea   . Propofol Nausea And Vomiting    Can do with nausea meds   Recent Results (from the past 09-06-2158 hour(s))  Comprehensive metabolic panel     Status: None   Collection Time: 04/12/17  2:48 PM  Result Value Ref Range   Sodium 140 135 - 145 mEq/L   Potassium 3.8 3.5 - 5.1 mEq/L   Chloride 102 96 - 112 mEq/L   CO2 30 19 - 32 mEq/L   Glucose, Bld 99 70 - 99 mg/dL   BUN 16 6 - 23 mg/dL   Creatinine,  Ser 0.66 0.40 - 1.20 mg/dL   Total Bilirubin 0.6 0.2 - 1.2 mg/dL   Alkaline Phosphatase 63 39 - 117 U/L   AST 22 0 - 37 U/L   ALT 15 0 - 35 U/L   Total Protein 6.9 6.0 - 8.3 g/dL   Albumin 4.4 3.5 -  5.2 g/dL   Calcium 10.2 8.4 - 10.5 mg/dL   GFR 95.46 >60.00 mL/min  CBC     Status: None   Collection Time: 04/12/17  2:48 PM  Result Value Ref Range   WBC 5.9 4.0 - 10.5 K/uL   RBC 4.39 3.87 - 5.11 Mil/uL   Platelets 235.0 150.0 - 400.0 K/uL   Hemoglobin 14.4 12.0 - 15.0 g/dL   HCT 43.6 36.0 - 46.0 %   MCV 99.5 78.0 - 100.0 fl   MCHC 33.1 30.0 - 36.0 g/dL   RDW 13.5 11.5 - 15.5 %  TSH     Status: None   Collection Time: 04/12/17  2:48 PM  Result Value Ref Range   TSH 1.68 0.35 - 4.50 uIU/mL  T4, free     Status: None   Collection Time: 04/12/17  2:48 PM  Result Value Ref Range   Free T4 0.75 0.60 - 1.60 ng/dL    Comment: Specimens from patients who are undergoing biotin therapy and /or ingesting biotin supplements may contain high levels of biotin.  The higher biotin concentration in these specimens interferes with this Free T4 assay.  Specimens that contain high levels  of biotin may cause false high results for this Free T4 assay.  Please interpret results in light of the total clinical presentation of the patient.    Urinalysis, Routine w reflex microscopic     Status: Abnormal   Collection Time: 04/12/17  2:48 PM  Result Value Ref Range   Color, Urine YELLOW Yellow;Lt. Yellow   APPearance CLEAR Clear   Specific Gravity, Urine 1.020 1.000 - 1.030   pH 6.0 5.0 - 8.0   Total Protein, Urine NEGATIVE Negative   Urine Glucose NEGATIVE Negative   Ketones, ur NEGATIVE Negative   Bilirubin Urine NEGATIVE Negative   Hgb urine dipstick NEGATIVE Negative   Urobilinogen, UA 0.2 0.0 - 1.0   Leukocytes, UA TRACE (A) Negative   Nitrite NEGATIVE Negative   WBC, UA 0-2/hpf 0-2/hpf   RBC / HPF 0-2/hpf 0-2/hpf   Squamous Epithelial / LPF Rare(0-4/hpf) Rare(0-4/hpf)  Hemoglobin A1c      Status: None   Collection Time: 04/12/17  2:48 PM  Result Value Ref Range   Hgb A1c MFr Bld 5.6 4.6 - 6.5 %    Comment: Glycemic Control Guidelines for People with Diabetes:Non Diabetic:  <6%Goal of Therapy: <7%Additional Action Suggested:  >8%   Hepatitis C antibody     Status: None   Collection Time: 04/12/17  2:48 PM  Result Value Ref Range   Hepatitis C Ab NON-REACTIVE NON-REACTI   SIGNAL TO CUT-OFF 0.00 <1.00   Objective  Body mass index is 23.16 kg/m. Wt Readings from Last 3 Encounters:  06/24/17 126 lb 9.6 oz (57.4 kg)  04/12/17 125 lb 2 oz (56.8 kg)  03/07/17 125 lb (56.7 kg)   Temp Readings from Last 3 Encounters:  06/24/17 98 F (36.7 C) (Oral)  04/12/17 98 F (36.7 C) (Oral)  03/07/17 97.8 F (36.6 C) (Oral)   BP Readings from Last 3 Encounters:  06/24/17 140/78  04/12/17 (!) 148/84  03/07/17 116/85   Pulse Readings from Last 3 Encounters:  06/24/17 68  04/12/17 86  03/07/17 66   O2 sat room air 99%   Physical Exam  Constitutional: She is oriented to person, place, and time and well-developed, well-nourished, and in no distress. Vital signs are normal.  HENT:  Head: Normocephalic and atraumatic.  Mouth/Throat: Oropharynx is clear and moist  and mucous membranes are normal.  Eyes: Conjunctivae are normal. Pupils are equal, round, and reactive to light.  Cardiovascular: Normal rate and regular rhythm.  Murmur heard. Pulmonary/Chest: Effort normal and breath sounds normal.  Abdominal: Soft. Bowel sounds are normal. There is no tenderness.  Neurological: She is alert and oriented to person, place, and time. Gait normal. Gait normal.  Skin: Skin is warm, dry and intact.  Psychiatric: Mood, memory, affect and judgment normal.  Nursing note and vitals reviewed.   Assessment   1. HTN  2. etoh abuse cut back  3. Depression life stressors/insomnia  4. Resolved rash to right back  5. Severe OA and rotator cuff tenonitis/ high grade partial thickness tear  right shoulder  6. Onychomycosis  7. HM Plan  1. Cont meds  2. Congratulated on cutting back  3. rec take zoloft 50 mg qd she stopped after 30 days  Disc melatonin otc  4. NTD she thinks she has latex allergy will monitor  5. F/u [facility] prn  6. Check lfts today midway on tx with Lamisil  Disc with dermatology as well   7.  Had flu shot  Given Tdap today   Had flu shot Rx prevnar will need pna 23 in 1 year  Disc shingrix perpt had old shingrix vaccine  Tdap had ? When had given today left arm  Consider check hep B status in future   Pap get records [facility] obtained and reviewed.   mammo abnl 06/05/17 repeat neg 06/14/17   Colonoscopy had 03/07/17 reviewed 2 mm polpy diverticula hyperplastic, Ext and Int Hemorrhoids Dr. Annye English f/u in 5 years   rec alcohol cessation she has cut back congratulated   Declines STD testing   Consider DEXA in future  referred dermatology Dr. Delman Cheadle in [place] today  Provider: Dr. Olivia Mackie McLean-Scocuzza-[facility]

## 2017-06-26 ENCOUNTER — Telehealth: Payer: Self-pay

## 2017-06-26 NOTE — Telephone Encounter (Signed)
-----   Message from Delorise Jackson, MD sent at 06/24/2017 12:38 PM EST ----- Did not get pap from [facility] with other records please call them and have them fax most recent pap   Thanks Steele

## 2017-06-26 NOTE — Telephone Encounter (Signed)
Was informed that they will be faxing results to [phone] again.

## 2017-07-15 ENCOUNTER — Telehealth: Payer: Self-pay | Admitting: Internal Medicine

## 2017-07-15 ENCOUNTER — Other Ambulatory Visit: Payer: Self-pay | Admitting: Internal Medicine

## 2017-07-15 DIAGNOSIS — I10 Essential (primary) hypertension: Secondary | ICD-10-CM

## 2017-07-15 MED ORDER — NIFEDIPINE ER 30 MG PO TB24: 30.0000 mg | ORAL_TABLET | Freq: Every day | ORAL | 1 refills | Status: DC

## 2017-07-15 NOTE — Telephone Encounter (Signed)
Copied from [place] [id number]. Topic: Quick Communication - Rx Refill/Question >> Jul 15, 2017 11:07 AM Ether Griffins B wrote: Medication: NIFEdipine (PROCARDIA-XL/ADALAT CC) 30 MG 24 hr tablet   Pt is completely out.   Has the patient contacted their pharmacy? Yes.     (Agent: If no, request that the patient contact the pharmacy for the refill.)   Preferred Pharmacy (with phone number or street name): [facility] [place], [place]: Please be advised that RX refills may take up to 3 business days. We ask that you follow-up with your pharmacy.

## 2017-07-31 ENCOUNTER — Encounter: Payer: Self-pay | Admitting: Internal Medicine

## 2017-08-28 ENCOUNTER — Ambulatory Visit: Payer: Self-pay | Admitting: *Deleted

## 2017-08-28 NOTE — Telephone Encounter (Signed)
What happened with this one when you seen it earlier?

## 2017-08-28 NOTE — Telephone Encounter (Signed)
Patient could not come to the appointment offered on 4.18.19 . Will follow up with GI.

## 2017-08-28 NOTE — Telephone Encounter (Signed)
Pt called because she has been having diarrhea for a month and a half. Sometime she has loose and watery stools. She denies fever, abd pain, bloating or nausea or vomiting. She drinks water and eats without difficulty. She called her GI and they encouraged her to bulk up on her diet and try the miralax.  She has an appointment with GI on 09/06/17. Pt advised to call back if symptoms get worse or increase. Pt voiced understanding. Will place in appointments on wait list per pt request. Lorriane Shire (flow) at [facility] notified.  Reason for Disposition . Diarrhea is a chronic symptom (recurrent or ongoing AND present > 4 weeks)  Answer Assessment - Initial Assessment Questions 1. DIARRHEA SEVERITY: "How bad is the diarrhea?" "How many extra stools have you had in the past 24 hours than normal?"    - MILD: Few loose or mushy BMs; increase of 1-3 stools over normal daily number of stools; mild increase in ostomy output.   - MODERATE: Increase of 4-6 stools daily over normal; moderate increase in ostomy output.   - SEVERE (or Worst Possible): Increase of 7 or more stools daily over normal; moderate increase in ostomy output; incontinence.     severe 2. ONSET: "When did the diarrhea begin?"      A month and a half ago 3. BM CONSISTENCY: "How loose or watery is the diarrhea?"      Watery and loose 4. VOMITING: "Are you also vomiting?" If so, ask: "How many times in the past 24 hours?"      no 5. ABDOMINAL PAIN: "Are you having any abdominal pain?" If yes: "What does it feel like?" (e.g., crampy, dull, intermittent, constant)      no 6. ABDOMINAL PAIN SEVERITY: If present, ask: "How bad is the pain?"  (e.g., Scale 1-10; mild, moderate, or severe)    - MILD (1-3): doesn't interfere with normal activities, abdomen soft and not tender to touch     - MODERATE (4-7): interferes with normal activities or awakens from sleep, tender to touch     - SEVERE (8-10): excruciating pain, doubled over, unable  to do any normal activities       no 7. ORAL INTAKE: If vomiting, "Have you been able to drink liquids?" "How much fluids have you had in the past 24 hours?"     Drinking water 8. HYDRATION: "Any signs of dehydration?" (e.g., dry mouth [not just dry lips], too weak to stand, dizziness, new weight loss) "When did you last urinate?"     none 9. EXPOSURE: "Have you traveled to a foreign country recently?" "Have you been exposed to anyone with diarrhea?" "Could you have eaten any food that was spoiled?"     no 10. OTHER SYMPTOMS: "Do you have any other symptoms?" (e.g., fever, blood in stool)       no 11. PREGNANCY: "Is there any chance you are pregnant?" "When was your last menstrual period?"       no  Protocols used: DIARRHEA-A-AH

## 2017-09-06 DIAGNOSIS — R198 Other specified symptoms and signs involving the digestive system and abdomen: Secondary | ICD-10-CM | POA: Diagnosis not present

## 2017-09-11 ENCOUNTER — Encounter

## 2017-09-13 ENCOUNTER — Ambulatory Visit (INDEPENDENT_AMBULATORY_CARE_PROVIDER_SITE_OTHER): Payer: Medicare Other

## 2017-09-13 ENCOUNTER — Ambulatory Visit (INDEPENDENT_AMBULATORY_CARE_PROVIDER_SITE_OTHER): Payer: Medicare Other | Admitting: Podiatry

## 2017-09-13 ENCOUNTER — Other Ambulatory Visit: Payer: Self-pay | Admitting: Podiatry

## 2017-09-13 DIAGNOSIS — L600 Ingrowing nail: Secondary | ICD-10-CM

## 2017-09-13 DIAGNOSIS — M7752 Other enthesopathy of left foot: Secondary | ICD-10-CM

## 2017-09-13 DIAGNOSIS — B070 Plantar wart: Secondary | ICD-10-CM | POA: Diagnosis not present

## 2017-09-13 DIAGNOSIS — R52 Pain, unspecified: Secondary | ICD-10-CM

## 2017-09-13 NOTE — Patient Instructions (Signed)

## 2017-09-16 NOTE — Progress Notes (Signed)
Subjective: 67 year old female presenting today as a new patient with multiple complaints. She is here for evaluation of pain to the medial border of the left great toe that began several weeks ago. Patient is concerned for possible ingrown nail. Applying pressure and wearing shoes increases the pain.  She also reports a painful nodule to the dorsum of the left foot that appeared two years ago. She also reports a painful lesion to the right heel and is concerned about a plantar wart. Walking and bearing weight increases the pain. She notes thickening of the toenails of bilateral feet that have been present for 6 months. She states she has taken Lamisil in the past for three months and states her nails are brittle now. She has not had any other treatment for her symptoms. There are no modifying factors noted. Patient states that the pain has been present for a few weeks now. Patient presents today for further treatment and evaluation.  Past Medical History:  Diagnosis Date  . Abnormal Pap smear of cervix   . Allergy   . Anxiety   . Arthritis   . Blood in stool    colonoscopy 02/2017  . Chicken pox   . Colon polyps   . Depression   . Diabetes mellitus without complication ([place])    type 2  . Diverticulitis   . GERD (gastroesophageal reflux disease)    h/o ulcers ? GI   . Glaucoma    borderline   . Hyperlipidemia   . Hypertension   . PONV (postoperative nausea and vomiting)    pull tubes out,etc.    Objective:  General: Well developed, nourished, in no acute distress, alert and oriented x3   Dermatology: Skin is warm, dry and supple bilateral. Medial border of the left great toe appears to be erythematous with evidence of an ingrowing nail. Pain on palpation noted to the border of the nail fold. Hyperkeratotic, discolored, thickened, onychodystrophy of nails noted bilaterally. Hyperkeratotic skin lesion noted to the plantar aspect of the right foot approximately 1 cm in diameter.  Pinpoint bleeding noted upon debridement. There are no open sores, lesions.  Vascular: Dorsalis Pedis artery and Posterior Tibial artery pedal pulses palpable. No lower extremity edema noted.   Neruologic: Grossly intact via light touch bilateral.  Musculoskeletal: Muscular strength within normal limits in all groups bilateral. Normal range of motion noted to all pedal and ankle joints.   Radiographic Exam:  Normal osseous mineralization. Joint spaces preserved. No fracture/dislocation/boney destruction.     Assesement: #1 Paronychia with ingrowing nail medial border left great toe #2 Pain in toe #3 Incurvated nail #4 DJD/OA left midfoot #5 onychomycosis bilateral toenails #6 plantar wart right heel   Plan of Care:  1. Patient evaluated. X-Rays reviewed.  2. Discussed treatment alternatives and plan of care. Explained nail avulsion procedure and post procedure course to patient. 3. Patient opted for permanent partial nail avulsion.  4. Prior to procedure, local anesthesia infiltration utilized using 3 ml of a 50:50 mixture of 2% plain lidocaine and 0.5% plain marcaine in a normal hallux block fashion and a betadine prep performed.  5. Partial permanent nail avulsion with chemical matrixectomy performed using [id number] applications of phenol followed by alcohol flush.  6. Light dressing applied. 7. Excisional debridement of the plantar wart lesion was performed using a chisel blade. Cantharone was applied and the lesion was dressed with a dry sterile dressing. 8. Recommended good shoe gear.  9. Mechanical debridement of nails 1-5 bilaterally  performed using a nail nipper. Filed with dremel without incident.  10. Return to clinic in 2 weeks.   Edrick Kins, DPM [facility]  Dr. Edrick Kins, [address]                                        [place], [place] [address]                Office [phone]  Fax [phone]

## 2017-09-18 DIAGNOSIS — M1712 Unilateral primary osteoarthritis, left knee: Secondary | ICD-10-CM | POA: Diagnosis not present

## 2017-09-27 ENCOUNTER — Ambulatory Visit: Payer: Medicare Other | Admitting: Podiatry

## 2017-09-30 ENCOUNTER — Encounter: Payer: Self-pay | Admitting: Podiatry

## 2017-09-30 ENCOUNTER — Ambulatory Visit (INDEPENDENT_AMBULATORY_CARE_PROVIDER_SITE_OTHER): Payer: Medicare Other | Admitting: Podiatry

## 2017-09-30 DIAGNOSIS — B070 Plantar wart: Secondary | ICD-10-CM | POA: Diagnosis not present

## 2017-09-30 DIAGNOSIS — L600 Ingrowing nail: Secondary | ICD-10-CM

## 2017-10-03 NOTE — Progress Notes (Signed)
   Subjective: Patient presents today 2 weeks post ingrown nail permanent nail avulsion procedure of the lateral border of the left hallux. She reports some associated redness of the area. She states the wart on the plantar aspect of the right heel has improved significantly. Patient is here for further evaluation and treatment.   Past Medical History:  Diagnosis Date  . Abnormal Pap smear of cervix   . Allergy   . Anxiety   . Arthritis   . Blood in stool    colonoscopy 02/2017  . Chicken pox   . Colon polyps   . Depression   . Diabetes mellitus without complication ([place])    type 2  . Diverticulitis   . GERD (gastroesophageal reflux disease)    h/o ulcers ? GI   . Glaucoma    borderline   . Hyperlipidemia   . Hypertension   . PONV (postoperative nausea and vomiting)    pull tubes out,etc.    Objective: Skin is warm, dry and supple. Nail and respective nail fold appears to be healing appropriately. Open wound to the associated nail fold with a granular wound base and moderate amount of fibrotic tissue. Minimal drainage noted. Mild erythema around the periungual region likely due to phenol chemical matricectomy.  Assessment: #1 postop permanent partial nail avulsion lateral border left hallux #2 open wound periungual nail fold of respective digit.  #3 plantar wart right heel - resolved   Plan of care: #1 patient was evaluated  #2 debridement of open wound was performed to the periungual border of the respective toe using a currette. Antibiotic ointment and Band-Aid was applied. #3 patient is to return to clinic on a PRN basis.   Edrick Kins, DPM [facility]  Dr. Edrick Kins, [address]                                        [place], [place] [address]                Office [phone]  Fax [phone]

## 2017-10-04 ENCOUNTER — Ambulatory Visit: Payer: Medicare Other | Admitting: Podiatry

## 2017-10-16 DIAGNOSIS — D1801 Hemangioma of skin and subcutaneous tissue: Secondary | ICD-10-CM | POA: Diagnosis not present

## 2017-10-16 DIAGNOSIS — L821 Other seborrheic keratosis: Secondary | ICD-10-CM | POA: Diagnosis not present

## 2017-10-16 DIAGNOSIS — Z23 Encounter for immunization: Secondary | ICD-10-CM | POA: Diagnosis not present

## 2017-10-16 DIAGNOSIS — L570 Actinic keratosis: Secondary | ICD-10-CM | POA: Diagnosis not present

## 2017-10-25 ENCOUNTER — Other Ambulatory Visit: Payer: Self-pay

## 2017-10-25 ENCOUNTER — Encounter: Payer: Self-pay | Admitting: Physical Therapy

## 2017-10-25 ENCOUNTER — Ambulatory Visit: Payer: Medicare Other | Admitting: Physical Therapy

## 2017-10-25 ENCOUNTER — Ambulatory Visit: Payer: Medicare Other | Attending: Surgery | Admitting: Physical Therapy

## 2017-10-25 DIAGNOSIS — R2689 Other abnormalities of gait and mobility: Secondary | ICD-10-CM

## 2017-10-25 DIAGNOSIS — M2170 Unequal limb length (acquired), unspecified site: Secondary | ICD-10-CM

## 2017-10-25 DIAGNOSIS — M4125 Other idiopathic scoliosis, thoracolumbar region: Secondary | ICD-10-CM

## 2017-10-25 DIAGNOSIS — R278 Other lack of coordination: Secondary | ICD-10-CM | POA: Insufficient documentation

## 2017-10-25 NOTE — Therapy (Addendum)
[place] MAIN [facility] SERVICES [place], [place], [address] Phone: [phone]   Fax:  [phone]  Physical Therapy Evaluation  Patient Details  Name: Desiree Pittman MRN: [id number] Date of Birth: 09-26-1950 No data recorded  Encounter Date: 10/25/2017    Past Medical History:  Diagnosis Date  . Abnormal Pap smear of cervix   . Allergy   . Anxiety   . Arthritis   . Blood in stool    colonoscopy 02/2017  . Chicken pox   . Colon polyps   . Depression   . Diabetes mellitus without complication ([place])    type 2  . Diverticulitis   . GERD (gastroesophageal reflux disease)    h/o ulcers ? GI   . Glaucoma    borderline   . Hyperlipidemia   . Hypertension   . PONV (postoperative nausea and vomiting)    pull tubes out,etc.    Past Surgical History:  Procedure Laterality Date  . ANAL RECTAL MANOMETRY N/A 03/08/2017   Procedure: ANO RECTAL MANOMETRY;  Surgeon: Ileana Roup, MD;  Location: [facility];  Service: General;  Laterality: N/A;  . CARPAL TUNNEL RELEASE    . COLONOSCOPY WITH PROPOFOL N/A 03/07/2017   Procedure: COLONOSCOPY WITH PROPOFOL;  Surgeon: Ileana Roup, MD;  Location: [facility];  Service: General;  Laterality: N/A;  . finergy surgery     cut finger sewn back on  . HAND SURGERY     ring finger left hand   . JOINT REPLACEMENT     left  hip  . TOTAL HIP ARTHROPLASTY  04/23/2012   Procedure: TOTAL HIP ARTHROPLASTY ANTERIOR APPROACH;  Surgeon: Gearlean Alf, MD;  Location: [facility];  Service: Orthopedics;  Laterality: Right;  . TOTAL HIP REVISION  04/23/2012   Procedure: TOTAL HIP REVISION;  Surgeon: Gearlean Alf, MD;  Location: [facility];  Service: Orthopedics;  Laterality: Left;    There were no vitals filed for this visit.   Subjective Assessment - 10/25/17 1113    Subjective  1) Fecal incontinence/ flatuelence, and hemorrhoids:  Pt has had bleeding hemorrhoids for years.  Pt had  them lanced and injected but they returned. Pt has to sit ina  certain way at work on her stool which causes more pressure onto her anus and she feels her hermorrhoids  When she recently used Miralax and Citracel as prescribed by Dr. Dema Severin which helped to soften the her stools and the hemorrhoids did not hurt as bad and had less bleeding. Pt Pt has stopped taking Miralax and Citrcel because her stools became too soft and she was not able to hold it. Pt is not able to feel the urge to go.  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 Pt also prefers to avoid public bathrooms for bowel movements because she has gas and bloating. Another barrier to using public bathrooms is that she has to wipe and wipe and not feel cleaned. At home, pt takes 25 min to fully herself bowel movements and uses a sitz bath.  Pt prefers to find a bathroom where she can be by herself.  Fecal incontinence started 2 years ago and has been worsening. Her GI MD performed multiple tests with removal of skin tag during colonoscopy and anal manometry.  Pt was referred to Pelvic PT.   Daily fluid intake :  2 ( 16 fl oz) water, 1-2 cups of coffee, 1-2 glass of wine.  Pt like iced water and with flavors. Pt is rushing and does not eat breakfast until 10 am. Pt eats on the run.  [place] Stool Type 5   75% of the time and Type  2-3 makes 25% of the time     2) urinary leakage with sneezing. Pt also holds her urine between patients. Nocturia x 1.   Pt wears 2 mini liner pads and changes the top pad 4 x day.        Pertinent History  Hip THA B, 2 vaginal deliveries with epiosiotomy and forceps,           [facility] PT Assessment - 10/25/17 1200      Observation/Other Assessments   Observations  limp with L ASIS to medial malleoli 84 cm, R LE 83 cm       Palpation   Spinal mobility  R upper thoracic convex, increased lumbar lordosis, R iliac crest  signfciantly higher . spinal ROM WFL in all direction    SI assessment   R ASIS more anterior       Coordination: limited diaphragmatic excursion, chest breathing. Poor mechanics with toileting and out of bed positions    Gait: antalgic limp, excessive genu varus, decreased stance on R LE        Objective measurements completed on examination: See above findings.        Treatment: see pt instructions . Provided shoe lift            PT Long Term Goals - 10/25/17 1139      PT LONG TERM GOAL #1   Title  Pt will report decreased time with cleaning herself afer bowel movements from 25 min to < 15 min when at home in order to be efficient and  progress to using bathrooms in the public bathrooms     Time  8    Period  Weeks    Status  New    Target Date  12/20/17      PT LONG TERM GOAL #2   Title  Pt will decrease her pad wear from 4 x per day to < 2 x day and also report decreased soiling bed sheest by 50% across 1 week in order to improve QOL     Time  12    Period  Weeks    Status  New    Target Date  01/17/18      PT LONG TERM GOAL #3   Title  Pt will follow a bladder and bowel schedule in order to minmize delaying urination / bowels and promote overall bowel and bladder health     Time  8    Period  Weeks    Status  New    Target Date  12/20/17      PT LONG TERM GOAL #4   Title  Improve stool consistency with a change in [place] Stool Type 5 occuring 75% of the time and Type 2-3 for  25% of the time to [facility] STool Type Type 4-5 across 100% of the time in order to minimize hemorrhoids and straining     Time  12    Period  Weeks    Status  New    Target Date  01/20/18             Plan - 10/25/17 1559    Clinical Impression Statement  Pt is a  67 yo female who reports fecal incontinence/ flatuelence, hemorrhoids, and stress urinary incontinence. These deficits impact her work and community activities. Pt's clinical assessment includes leg length  discrepancy, scoliosis, antalgic gait with deviations, genu varus, dyscoordination and weak deep core strength. Plan to assess hip strength ( Hx of B THA ) and pelvic floor ( Hx of perineal scars with episiotomies and use of forceps in L & D). Following today's session, pt was provided a shoe lift which helped to create a more levelled pelvis. Pt was provided motivational interviewing to co-create strategies to improve water intake. Behavioral education was provided about the plan to complete food diary chart, gradually start bowel training to minimize delaying bowel movements at work.  Pt voiced understanding.    Clinical Presentation  Evolving    Clinical Decision Making  High    Rehab Potential  Good    PT Frequency  1x / week    PT Duration  12 weeks    PT Treatment/Interventions  Aquatic Therapy;Therapeutic activities;Therapeutic exercise;Patient/family education;Balance training;Gait training;Moist Heat;Neuromuscular re-education;Manual techniques;Scar mobilization;Passive range of motion;Traction    Consulted and Agree with Plan of Care  Patient       Patient will benefit from skilled therapeutic intervention in order to improve the following deficits and impairments:  Postural dysfunction, Pain, Improper body mechanics, Impaired sensation, Increased fascial restricitons, Hypomobility, Decreased range of motion, Decreased endurance, Decreased strength, Decreased safety awareness, Difficulty walking, Decreased balance, Decreased mobility, Decreased coordination, Decreased scar mobility, Increased muscle spasms, Impaired flexibility  Visit Diagnosis: Other idiopathic scoliosis, thoracolumbar region  Other lack of coordination  Unequal leg length  Other abnormalities of gait and mobility     Problem List Patient Active Problem List   Diagnosis Date Noted  . Depression, major, single episode, mild ([place]) 04/12/2017  . Eczema 04/12/2017  . Essential hypertension 04/12/2017  . Anxiety  04/12/2017  . Alcohol abuse 04/12/2017  . Acute pain of right shoulder 04/12/2017  . Onychomycosis 04/12/2017  . Cardiac murmur 04/12/2017  . Intertrigo 04/12/2017  . Postop Acute blood loss anemia 04/24/2012  . Postop Hyponatremia 04/24/2012  . OA (osteoarthritis) of hip 04/23/2012    Jerl Mina ,PT, DPT, E-RYT  10/28/2017, 9:52 AM  [place] MAIN [facility] SERVICES [address] [place], [place], [address] Phone: [phone]   Fax:  [phone]  Name: Desiree Pittman MRN: [id number] Date of Birth: 1950/11/30

## 2017-10-25 NOTE — Patient Instructions (Addendum)
Increase water with room temperature water glass in the morning, decrease from 2 cups of coffee to one   Total water per day this week, glass in the morning and the 2 bottles     ________  Deep core level 1 ( breathing)   Pelvic tilt to sit on sitting bones instead of tailbone which will help with hemorrhoids   ________  Complete food diary

## 2017-10-28 NOTE — Addendum Note (Signed)
Addended by: Jerl Mina on: 10/28/2017 09:54 AM   Modules accepted: Orders

## 2017-11-01 ENCOUNTER — Ambulatory Visit: Payer: Medicare Other | Admitting: Physical Therapy

## 2017-11-01 DIAGNOSIS — R278 Other lack of coordination: Secondary | ICD-10-CM | POA: Diagnosis not present

## 2017-11-01 DIAGNOSIS — M2170 Unequal limb length (acquired), unspecified site: Secondary | ICD-10-CM

## 2017-11-01 DIAGNOSIS — M4125 Other idiopathic scoliosis, thoracolumbar region: Secondary | ICD-10-CM | POA: Diagnosis not present

## 2017-11-01 DIAGNOSIS — R2689 Other abnormalities of gait and mobility: Secondary | ICD-10-CM | POA: Diagnosis not present

## 2017-11-01 NOTE — Patient Instructions (Addendum)
Morning and night:  Open book  Deep core level 1 and 2   _______    Avoid straining pelvic floor, abdominal muscles , spine  Use log rolling technique instead of getting out of bed with your neck or the sit-up   Log rolling out of .bed on the left side   L  arm overhead  Raise hips and scoot hips to R   Drop knees to L,  scooting L shoulder back to get completely on your L side so your shoulders, hips, and knees point to the L    Then breathe as you drop feet off bed and prop onto L elbow and  use both hands to push yourself       ______ Work   When patient is swooshing, use that moment to do cat /cow  Dragging hands along thighs,,elbows back, chest lifts , shoulders back 5 reps    Standing, arms swings to rotate trunk  5 reps

## 2017-11-01 NOTE — Therapy (Signed)
[place] MAIN [facility] SERVICES [address] [place], [place], [address] Phone: [phone]   Fax:  [phone]  Physical Therapy Treatment  Patient Details  Name: Desiree Pittman MRN: [id number] Date of Birth: December 24, 1950 No data recorded  Encounter Date: 11/01/2017  PT End of Session - 11/01/17 1001    Visit Number  2    Number of Visits  12    Date for PT Re-Evaluation  01/17/18    Authorization Type  Medicare     PT Start Time  0803    PT Stop Time  0915    PT Time Calculation (min)  72 min    Activity Tolerance  Patient tolerated treatment well;No increased pain    Behavior During Therapy  WFL for tasks assessed/performed       Past Medical History:  Diagnosis Date  . Abnormal Pap smear of cervix   . Allergy   . Anxiety   . Arthritis   . Blood in stool    colonoscopy 02/2017  . Chicken pox   . Colon polyps   . Depression   . Diabetes mellitus without complication ([facility])    type 2  . Diverticulitis   . GERD (gastroesophageal reflux disease)    h/o ulcers ? GI   . Glaucoma    borderline   . Hyperlipidemia   . Hypertension   . PONV (postoperative nausea and vomiting)    pull tubes out,etc.    Past Surgical History:  Procedure Laterality Date  . ANAL RECTAL MANOMETRY N/A 03/08/2017   Procedure: ANO RECTAL MANOMETRY;  Surgeon: Ileana Roup, MD;  [facility]: [facility];  Service: General;  Laterality: N/A;  . CARPAL TUNNEL RELEASE    . COLONOSCOPY WITH PROPOFOL N/A 03/07/2017   Procedure: COLONOSCOPY WITH PROPOFOL;  Surgeon: Ileana Roup, MD;  Location: [facility];  Service: General;  Laterality: N/A;  . finergy surgery     cut finger sewn back on  . HAND SURGERY     ring finger left hand   . JOINT REPLACEMENT     left  hip  . TOTAL HIP ARTHROPLASTY  04/23/2012   Procedure: TOTAL HIP ARTHROPLASTY ANTERIOR APPROACH;  Surgeon: Gearlean Alf, MD;  Location: [facility];  Service: Orthopedics;  Laterality:  Right;  . TOTAL HIP REVISION  04/23/2012   Procedure: TOTAL HIP REVISION;  Surgeon: Gearlean Alf, MD;  Location: [facility];  Service: Orthopedics;  Laterality: Left;    There were no vitals filed for this visit.  Subjective Assessment - 11/01/17 0809    Subjective  Pt has started to warm water in the morning in place of coffee and is drinking more water during the day     Pertinent History  Hip THA B, 2 vaginal deliveries with epiosiotomy and forceps,           [facility] PT Assessment - 11/01/17 0825      Strength   Overall Strength Comments  hip ext 3+/5B, L hip abd 4-/5, R 3+/5, knee flexion in prone: 3+/5 B       Palpation   Spinal mobility  increased posterior/ lateral intercostal mm scapula mm tensions, hypomobility of thoracic spine  . Pre Tx: limited diaphragmatic excursion, increaded post Tx    SI assessment   lacked sacral nutation, SIJ mobility into extension       Ambulation/Gait   Gait Comments  prior to placement of L shoe lift ,  excessive B lateral lean,  after insert of shoe lift, decreased R trunk lean.  Noted: hyperextension of L knee due to pain,  iliac crest L levelled with shoe lift                   [facility] Adult PT Treatment/Exercise - 11/01/17 0825      Therapeutic Activites    Therapeutic Activities  -- gait assessment, shoe lift in L shoe       Neuro Re-ed    Neuro Re-ed Details   see pt instructions      Manual Therapy   Manual therapy comments  STM, MWM at problem areas at thoracic area.  PA mob Grade II-III in prone to faciliate hip ext, superior mob at sacrum to faciliate sacral nutation             PT Education - 11/01/17 1000    Education Details  HEP    Person(s) Educated  Patient    Methods  Explanation;Demonstration;Tactile cues;Verbal cues;Handout    Comprehension  Returned demonstration;Verbalized understanding;Verbal cues required;Tactile cues required          PT Long Term Goals - 11/01/17 1003      PT LONG TERM  GOAL #1   Title  Pt will report decreased time with cleaning herself afer bowel movements from 25 min to < 15 min when at home in order to be efficient and  progress to using bathrooms in the public bathrooms     Time  8    Period  Weeks    Status  On-going      PT LONG TERM GOAL #2   Title  Pt will decrease her pad wear from 4 x per day to < 2 x day and also report decreased soiling bed sheest by 50% across 1 week in order to improve QOL     Time  12    Period  Weeks    Status  On-going      PT LONG TERM GOAL #3   Title  Pt will follow a bladder and bowel schedule in order to minmize delaying urination / bowels and promote overall bowel and bladder health     Time  8    Period  Weeks    Status  On-going      PT LONG TERM GOAL #4   Title  Improve stool consistency with a change in [place] Stool Type 5 occuring 75% of the time and Type 2-3 for  25% of the time to [facility] STool Type Type 4-5 across 100% of the time in order to minimize hemorrhoids and straining     Time  12    Period  Weeks    Status  On-going      PT LONG TERM GOAL #5   Title  Pt will decrease her COREFO score from 48% to < 38% in order to improve GI system and QOL    Time  10    Status  New    Target Date  01/10/18            Plan - 11/01/17 1100    Clinical Impression Statement  Pt returns for her 2nd visit with compliance to increased water intake. Pt tolerated without complaints manual Tx today which helped to increase her SIJ mobility, hip extension, diaphramgatic excursion, and more upright posture. Pt showedless R trunk lean with L shoe lift. Initated deep core strengthening and spinalm obilty flexbility HEP to perform at work to minimize slumped posture.  Plan to assess pelvic floor at upcoming session. Pt will need further glut strengthening lower kinetic chain strengthening with use of regional interdependent approach to yield long lasting benefits to her pelvic and lower extremities deficits.  Pt  continues to benefit from skilled PT   Rehab Potential  Good    PT Frequency  1x / week    PT Duration  12 weeks    PT Treatment/Interventions  Aquatic Therapy;Therapeutic activities;Therapeutic exercise;Patient/family education;Balance training;Gait training;Moist Heat;Neuromuscular re-education;Manual techniques;Scar mobilization;Passive range of motion;Traction    Consulted and Agree with Plan of Care  Patient       Patient will benefit from skilled therapeutic intervention in order to improve the following deficits and impairments:  Postural dysfunction, Pain, Improper body mechanics, Impaired sensation, Increased fascial restricitons, Hypomobility, Decreased range of motion, Decreased endurance, Decreased strength, Decreased safety awareness, Difficulty walking, Decreased balance, Decreased mobility, Decreased coordination, Decreased scar mobility, Increased muscle spasms, Impaired flexibility  Visit Diagnosis: Other lack of coordination  Unequal leg length  Other idiopathic scoliosis, thoracolumbar region  Other abnormalities of gait and mobility     Problem List Patient Active Problem List   Diagnosis Date Noted  . Depression, major, single episode, mild ([place]) 04/12/2017  . Eczema 04/12/2017  . Essential hypertension 04/12/2017  . Anxiety 04/12/2017  . Alcohol abuse 04/12/2017  . Acute pain of right shoulder 04/12/2017  . Onychomycosis 04/12/2017  . Cardiac murmur 04/12/2017  . Intertrigo 04/12/2017  . Postop Acute blood loss anemia 04/24/2012  . Postop Hyponatremia 04/24/2012  . OA (osteoarthritis) of hip 04/23/2012    Jerl Mina ,PT, DPT, E-RYT  11/01/2017, 11:04 AM  [place] MAIN [facility] SERVICES [address] [place], [place], [address] Phone: [phone]   Fax:  [phone]  Name: Desiree Pittman MRN: [id number] Date of Birth: 27-Jan-1951

## 2017-11-08 ENCOUNTER — Ambulatory Visit: Payer: Medicare Other | Admitting: Physical Therapy

## 2017-11-08 DIAGNOSIS — M2170 Unequal limb length (acquired), unspecified site: Secondary | ICD-10-CM | POA: Diagnosis not present

## 2017-11-08 DIAGNOSIS — R2689 Other abnormalities of gait and mobility: Secondary | ICD-10-CM | POA: Diagnosis not present

## 2017-11-08 DIAGNOSIS — M4125 Other idiopathic scoliosis, thoracolumbar region: Secondary | ICD-10-CM | POA: Diagnosis not present

## 2017-11-08 DIAGNOSIS — R278 Other lack of coordination: Secondary | ICD-10-CM | POA: Diagnosis not present

## 2017-11-08 NOTE — Therapy (Signed)
[place] MAIN [facility] SERVICES [place], [place], [address] Phone: [phone]   Fax:  [phone]  Physical Therapy Treatment  Patient Details  Name: Desiree Pittman MRN: [id number] Date of Birth: [birth year] No data recorded  Encounter Date: 11/08/2017    Past Medical History:  Diagnosis Date  . Abnormal Pap smear of cervix   . Allergy   . Anxiety   . Arthritis   . Blood in stool    colonoscopy 02/2017  . Chicken pox   . Colon polyps   . Depression   . Diabetes mellitus without complication ([place])    type 2  . Diverticulitis   . GERD (gastroesophageal reflux disease)    h/o ulcers ? GI   . Glaucoma    borderline   . Hyperlipidemia   . Hypertension   . PONV (postoperative nausea and vomiting)    pull tubes out,etc.    Past Surgical History:  Procedure Laterality Date  . ANAL RECTAL MANOMETRY N/A 03/08/2017   Procedure: ANO RECTAL MANOMETRY;  Surgeon: Ileana Roup, MD;  [facility];  Service: General;  Laterality: N/A;  . CARPAL TUNNEL RELEASE    . COLONOSCOPY WITH PROPOFOL N/A 03/07/2017   Procedure: COLONOSCOPY WITH PROPOFOL;  Surgeon: Ileana Roup, MD;  [facility];  Service: General;  Laterality: N/A;  . finergy surgery     cut finger sewn back on  . HAND SURGERY     ring finger left hand   . JOINT REPLACEMENT     left  hip  . TOTAL HIP ARTHROPLASTY  04/23/2012   Procedure: TOTAL HIP ARTHROPLASTY ANTERIOR APPROACH;  Surgeon: Gearlean Alf, MD;  [facility];  Service: Orthopedics;  Laterality: Right;  . TOTAL HIP REVISION  04/23/2012   Procedure: TOTAL HIP REVISION;  Surgeon: Gearlean Alf, MD;  [facility];  Service: Orthopedics;  Laterality: Left;    There were no vitals filed for this visit.  Subjective Assessment - 11/08/17 1107    Subjective  Pt reports she no longer has blood with bowel movements after last session. Pt has had looser stool but it is  better formed which she thinks will help her hemorrhoids. Pt is still has seepage during the day and night.  reports her L knee pain has been 20% better and is not hurting as much at rest. Pt 's coworkers has noticed she is walkingbette.r Pt has to keep focused on her technique      Pertinent History  Hip THA B, 2 vaginal deliveries with epiosiotomy and forceps,                      Pelvic Floor Special Questions - 11/08/17 1217    External Palpation  rectal external hemorrhoids     Pelvic Floor Internal Exam  pt consented verbally without contraindications    Exam Type  Rectal    Palpation  increased scar restrictions at 12  o clock with tenderness  dyscoordination     Strength  good squeeze, good lift, able to hold agaisnt strong resistance        [facility] Adult PT Treatment/Exercise - 11/08/17 1218      Neuro Re-ed    Neuro Re-ed Details   see pt instructions      Manual Therapy   Internal Pelvic Floor  gental fascial release at problem areas  PT Long Term Goals - 11/01/17 1003      PT LONG TERM GOAL #1   Title  Pt will report decreased time with cleaning herself afer bowel movements from 25 min to < 15 min when at home in order to be efficient and  progress to using bathrooms in the public bathrooms     Time  8    Period  Weeks    Status  On-going      PT LONG TERM GOAL #2   Title  Pt will decrease her pad wear from 4 x per day to < 2 x day and also report decreased soiling bed sheest by 50% across 1 week in order to improve QOL     Time  12    Period  Weeks    Status  On-going      PT LONG TERM GOAL #3   Title  Pt will follow a bladder and bowel schedule in order to minmize delaying urination / bowels and promote overall bowel and bladder health     Time  8    Period  Weeks    Status  On-going      PT LONG TERM GOAL #4   Title  Improve stool consistency with a change in [place] Stool Type 5 occuring 75% of the time and Type 2-3  for  25% of the time to [facility] STool Type Type 4-5 across 100% of the time in order to minimize hemorrhoids and straining     Time  12    Period  Weeks    Status  On-going      PT LONG TERM GOAL #5   Title  Pt will decrease her COREFO score from 48% to < 38% in order to improve GI system and QOL    Time  10    Status  New    Target Date  01/10/18            Plan - 11/08/17 1214    Clinical Impression Statement  Pt is making progress as she reports her rectal bleeding from hemorrhoids have been less. Pt's gait has improved with the wearing of the shoe lift in her L shoe.  Pt reports her R knee pain has decreased , especially while sitting. Pt 's rectal assessment today showed increased scar restrictions at 12 o clock position with tenderness and dyscoordination of pelvic floor. Pt showed proper pelvic floor coordination and strength with cues. Added urge suppression training, education on bladder retraining to Tx today. Pt continues to benefit from skilled PT.     Rehab Potential  Good    PT Frequency  1x / week    PT Duration  12 weeks    PT Treatment/Interventions  Aquatic Therapy;Therapeutic activities;Therapeutic exercise;Patient/family education;Balance training;Gait training;Moist Heat;Neuromuscular re-education;Manual techniques;Scar mobilization;Passive range of motion;Traction    Consulted and Agree with Plan of Care  Patient       Patient will benefit from skilled therapeutic intervention in order to improve the following deficits and impairments:  Postural dysfunction, Pain, Improper body mechanics, Impaired sensation, Increased fascial restricitons, Hypomobility, Decreased range of motion, Decreased endurance, Decreased strength, Decreased safety awareness, Difficulty walking, Decreased balance, Decreased mobility, Decreased coordination, Decreased scar mobility, Increased muscle spasms, Impaired flexibility  Visit Diagnosis: Other abnormalities of gait and  mobility  Unequal leg length  Other idiopathic scoliosis, thoracolumbar region     Problem List Patient Active Problem List   Diagnosis Date Noted  . Depression, major, single  episode, mild ([place]) 04/12/2017  . Eczema 04/12/2017  . Essential hypertension 04/12/2017  . Anxiety 04/12/2017  . Alcohol abuse 04/12/2017  . Acute pain of right shoulder 04/12/2017  . Onychomycosis 04/12/2017  . Cardiac murmur 04/12/2017  . Intertrigo 04/12/2017  . Postop Acute blood loss anemia 04/24/2012  . Postop Hyponatremia 04/24/2012  . OA (osteoarthritis) of hip 04/23/2012    Jerl Mina ,PT, DPT, E-RYT  11/08/2017, 12:20 PM  [place] MAIN [facility] SERVICES [address] [place], [place], [address] Phone: [phone]   Fax:  [phone]  Name: Elektra Wartman MRN: [id number] Date of Birth: [birth year]

## 2017-11-08 NOTE — Patient Instructions (Signed)
  URGE SUPPRESION TECHNIQUES  ? These techniques are to be used to suppress those abnormally strong URGES to urinate, especially after you have already urinated < 1hr ago.  ? These steps do not have to be followed in order, and not all steps have to be used.   ? The purpose of these steps is to help you regain control of your bladder, to reduce the amount of urinary urgency, frequency, or leaking.  ? They take practice to master in controlling urgency.  Allow yourself to be okay with leaking when you first start practicing these steps. ? Practice these steps first at home, when you do not have to worry as much about leaking.  1. SIT DOWN.  Pressure on the pelvic floor inhibits the bladder.  Further pressure may help, such as sitting on a small rolled up towel. 2. LIGHT "KEGELS" using elevator imagery.  Breathe in, feel pelvic floor lower to "basement" level by the end of the inhalation. Exhale, feel pelvic floor gradually move up to "1st floor" which is the neutral position of pelvic floor. At the end of exhalation, feel pelvic floor lift higher at which you will perform a quick light squeeze (the muscles that hold back gas and urine).  Do not do hard, maximal contractions, as this will quickly fatigue the muscles and cause leakage. Perform this 5 repetitions in this pattern.  3. BREATHE & STAY CALM.  Breathing slowly and remaining calm will inhibit your sympathetic nervous system, which will in turn calm the bladder.   4. DISTRACTION.  Sit with a project that will engage your mind.  Anything that works for you - reading, word puzzles, crochet, knitting, checking email, balancing the checkbook, and so on. 5. VISUALIZATION.  Imagine that you are in a place/situation in which either you cannot or do not want to leave.  Examples: in a car and cannot stop; lying on a beach with a far walk to a restroom; at dinner with someone special.  If the urge persists after practicing these steps and feel you must go  to the bathroom, then it is imperative that you . . . . ? Walk slowly and calmly to the bathroom ? Maintain calm breathing ? Refrain from undressing until you are standing over the toilet Rushing to the restroom will only encourage the strong bladder urges and leaking.  Again, the more you practice, the easier these steps will become.  _______   Normalizing trips to the bathroom once every 2 hours Sit and relax on toilet to completely empty  Take your time   _______   Add 10 quick squeezes into Deep core level 1 ( breathing , exhale, then squeeze, then lengthen)   Deep core leve 2

## 2017-11-22 ENCOUNTER — Ambulatory Visit: Payer: Medicare Other | Attending: Surgery | Admitting: Physical Therapy

## 2017-11-22 DIAGNOSIS — F320 Major depressive disorder, single episode, mild: Secondary | ICD-10-CM | POA: Diagnosis not present

## 2017-11-22 DIAGNOSIS — M2170 Unequal limb length (acquired), unspecified site: Secondary | ICD-10-CM | POA: Diagnosis not present

## 2017-11-22 DIAGNOSIS — R278 Other lack of coordination: Secondary | ICD-10-CM | POA: Insufficient documentation

## 2017-11-22 DIAGNOSIS — M4125 Other idiopathic scoliosis, thoracolumbar region: Secondary | ICD-10-CM | POA: Insufficient documentation

## 2017-11-22 DIAGNOSIS — R2689 Other abnormalities of gait and mobility: Secondary | ICD-10-CM | POA: Diagnosis not present

## 2017-11-22 NOTE — Patient Instructions (Addendum)
   Ankle strengthening on L with red band , loop on ballmound of L foot, R foot is hip width apart, pressing onto the ballmound , R hand holds the band 30 reps swinging L pinky toe out to the L  X 2 day   _______    Joie Bimler 45 Degrees  L hip only     Lying with hips and knees bent 45, one pillow between knees and ankles. Lift knee with exhale. Be sure pelvis does not roll backward. Do not arch back. Do 30 times, each leg, 2 times per day.   Complimentary stretch: Cross L foot over R thigh, R knee straight  3 breaths   Cross thigh over thigh, exhale to hug the thighs in with arms pulling back of thigh, shoulders/ head is relaxed down , Use towel behind thigh is needed.  10 reps  Cross L ankle over R thigh, R knee bent, foot on bed ( Figure-4)  exhale to hug the thighs in with arms pulling back of thigh, shoulders/ head is relaxed down , Use towel behind thigh is needed.  10 reps   _________   Start recumbent biking 15 min  With warm up and cool down  Stretches after   Once a day and progress to 2 x day    ______ Even-up Shoe [email or web address]  _____ Standing with feet arch lifted, and deep core activated  Alternate from bilateral stance, to ski track stance 50% weight on both

## 2017-11-22 NOTE — Therapy (Signed)
[place] MAIN [facility] SERVICES [address] [place], [place], [address] Phone: [phone]   Fax:  [phone]  Physical Therapy Treatment  Patient Details  Name: Desiree Pittman MRN: [id number] Date of Birth: 15-Mar-1951 No data recorded  Encounter Date: 11/22/2017  PT End of Session - 11/22/17 0908    Visit Number  4    Number of Visits  12    Date for PT Re-Evaluation  01/17/18    Authorization Type  Medicare     PT Start Time  0808    PT Stop Time  0909    PT Time Calculation (min)  61 min    Activity Tolerance  Patient tolerated treatment well;No increased pain    Behavior During Therapy  WFL for tasks assessed/performed       Past Medical History:  Diagnosis Date  . Abnormal Pap smear of cervix   . Allergy   . Anxiety   . Arthritis   . Blood in stool    colonoscopy 02/2017  . Chicken pox   . Colon polyps   . Depression   . Diabetes mellitus without complication ([place])    type 2  . Diverticulitis   . GERD (gastroesophageal reflux disease)    h/o ulcers ? GI   . Glaucoma    borderline   . Hyperlipidemia   . Hypertension   . PONV (postoperative nausea and vomiting)    pull tubes out,etc.    Past Surgical History:  Procedure Laterality Date  . ANAL RECTAL MANOMETRY N/A 03/08/2017   Procedure: ANO RECTAL MANOMETRY;  Surgeon: Ileana Roup, MD;  Location: [facility];  Service: General;  Laterality: N/A;  . CARPAL TUNNEL RELEASE    . COLONOSCOPY WITH PROPOFOL N/A 03/07/2017   Procedure: COLONOSCOPY WITH PROPOFOL;  Surgeon: Ileana Roup, MD;  Location: [facility];  Service: General;  Laterality: N/A;  . finergy surgery     cut finger sewn back on  . HAND SURGERY     ring finger left hand   . JOINT REPLACEMENT     left  hip  . TOTAL HIP ARTHROPLASTY  04/23/2012   Procedure: TOTAL HIP ARTHROPLASTY ANTERIOR APPROACH;  Surgeon: Gearlean Alf, MD;  Location: [facility];  Service: Orthopedics;  Laterality:  Right;  . TOTAL HIP REVISION  04/23/2012   Procedure: TOTAL HIP REVISION;  Surgeon: Gearlean Alf, MD;  Location: [facility];  Service: Orthopedics;  Laterality: Left;    There were no vitals filed for this visit.  Subjective Assessment - 11/22/17 0814    Subjective  Pt states " I just feel better. The walking has gotten so much better too. "   reports she did not frequent urination at work last week. Pt worked through 3 hours in the morning before emptying. Her urge is better. Pt still has the urge when walking in the stores or in the yard, she has the urge. When she sits down, her urge is ok. Across the 2 past weeks , pt did not have blood when wiping / in the toilet with bowel movements until this morning. Walking still hurts her L inside knee area. Pt had B THA and L hip was revision due to the growth of a cyst.       Pertinent History  Hip THA B, 2 vaginal deliveries with epiosiotomy and forceps,           [facility] PT Assessment - 11/22/17 6837  Strength   Overall Strength Comments  L ankle eversion 3+/5, R 4/5         Ambulation/Gait   Gait Comments  L knee instability, more medial rotation on stance phase,                    [facility] Adult PT Treatment/Exercise - 11/22/17 8299      Therapeutic Activites    Therapeutic Activities  -- standing and co-activation of core at deep core and feet       Neuro Re-ed    Neuro Re-ed Details   see pt instructions, standing posture, arch co-activation       Manual Therapy   Manual therapy comments  medial glide at femur, lateral glie of tibia L     Kinesiotex  -- lateral direction for knee stability                  PT Long Term Goals - 11/22/17 0818      PT LONG TERM GOAL #1   Title  Pt will report decreased time with cleaning herself afer bowel movements from 25 min to < 15 min when at home in order to be efficient and  progress to using bathrooms in the public bathrooms     Time  8    Period  Weeks    Status   Achieved      PT LONG TERM GOAL #2   Title  Pt will decrease her pad wear from 4 x per day to < 2 x day and also report decreased soiling bed sheet by 50% across 1 week in order to improve QOL     Time  12    Period  Weeks    Status  Achieved      PT LONG TERM GOAL #3   Title  Pt will follow a bladder and bowel schedule in order to minmize delaying urination / bowels and promote overall bowel and bladder health     Time  8    Period  Weeks    Status  Achieved      PT LONG TERM GOAL #4   Title  Improve stool consistency with a change in [place] Stool Type 5 occuring 75% of the time and Type 2-3 for  25% of the time to [facility] STool Type Type 4-5 across 100% of the time in order to minimize hemorrhoids and straining     Time  12    Period  Weeks    Status  Partially Met      PT LONG TERM GOAL #5   Title  Pt will decrease her COREFO score from 48% to < 38% in order to improve GI system and QOL    Time  10    Status  New      Additional Long Term Goals   Additional Long Term Goals  Yes      PT LONG TERM GOAL #6   Title  Pt will report decreased blood in toilet and bowels due to hemorrhoids from 1 x across 2 weeks to 1 x across 1 month in order to have less hemorrhoids and improved colorectal health     Time  8    Period  Weeks    Status  New    [place] Date  01/17/18      PT LONG TERM GOAL #7   Title  Pt will report feeling decreased urinary urgency when standing/ walking during 2 shopping  trips to the store in order to perform community activities     Time  8    Period  Weeks    Status  New    Target Date  01/17/18            Plan - 11/22/17 0909    Clinical Impression Statement  Addressed L LE lower kinetic chain to strengthen L hip abductor mm and ankle eversion mm due to weakness. Also applied manual Tx and kinesiotaping at L knee to propvide more stability. Pt tolerated treatment without complaints. Pt reported she walked with more L knee staibility after Tx.  Pt  continues to require skilled PT to address lower kinetic chain to minimize L knee pain while walking and minimize urge with walking/ standing. Plan to reassess pelvic floor at upcoming session.      Rehab Potential  Good    PT Frequency  1x / week    PT Duration  12 weeks    PT Treatment/Interventions  Aquatic Therapy;Therapeutic activities;Therapeutic exercise;Patient/family education;Balance training;Gait training;Moist Heat;Neuromuscular re-education;Manual techniques;Scar mobilization;Passive range of motion;Traction    Consulted and Agree with Plan of Care  Patient       Patient will benefit from skilled therapeutic intervention in order to improve the following deficits and impairments:  Postural dysfunction, Pain, Improper body mechanics, Impaired sensation, Increased fascial restricitons, Hypomobility, Decreased range of motion, Decreased endurance, Decreased strength, Decreased safety awareness, Difficulty walking, Decreased balance, Decreased mobility, Decreased coordination, Decreased scar mobility, Increased muscle spasms, Impaired flexibility  Visit Diagnosis: Unequal leg length  Other idiopathic scoliosis, thoracolumbar region  Other abnormalities of gait and mobility  Other lack of coordination     Problem List Patient Active Problem List   Diagnosis Date Noted  . Depression, major, single episode, mild ([place]) 04/12/2017  . Eczema 04/12/2017  . Essential hypertension 04/12/2017  . Anxiety 04/12/2017  . Alcohol abuse 04/12/2017  . Acute pain of right shoulder 04/12/2017  . Onychomycosis 04/12/2017  . Cardiac murmur 04/12/2017  . Intertrigo 04/12/2017  . Postop Acute blood loss anemia 04/24/2012  . Postop Hyponatremia 04/24/2012  . OA (osteoarthritis) of hip 04/23/2012    Jerl Mina ,PT, DPT, E-RYT  11/22/2017, 9:17 AM  [place] MAIN [facility] SERVICES [address] [place], [place], [address] Phone: [phone]   Fax:   [phone]  Name: Desiree Pittman MRN: [id number] Date of Birth: Jun 27, 1950

## 2017-11-25 ENCOUNTER — Telehealth: Payer: Self-pay | Admitting: Internal Medicine

## 2017-11-25 NOTE — Telephone Encounter (Signed)
Copied from [place] [id number]. Topic: Quick Communication - See Telephone Encounter >> Nov 25, 2017 11:18 AM Vernona Rieger wrote: CRM for notification. See Telephone encounter for: 11/25/17.  Patient is requesting something else in place of losartan-hydrochlorothiazide (HYZAAR) 50-12.5 MG tablet due to it being recalled. The pharmacy reached out to the office on Friday 7/12.  Please advise.   [place], [place]

## 2017-11-25 NOTE — Telephone Encounter (Signed)
Please advise 

## 2017-11-26 ENCOUNTER — Other Ambulatory Visit: Payer: Self-pay | Admitting: Internal Medicine

## 2017-11-26 DIAGNOSIS — I10 Essential (primary) hypertension: Secondary | ICD-10-CM

## 2017-11-26 MED ORDER — HYDROCHLOROTHIAZIDE 12.5 MG PO TABS: 12.5000 mg | ORAL_TABLET | Freq: Every day | ORAL | 0 refills | Status: DC

## 2017-11-26 MED ORDER — LOSARTAN POTASSIUM 50 MG PO TABS: 50.0000 mg | ORAL_TABLET | Freq: Every day | ORAL | 0 refills | Status: DC

## 2017-11-27 NOTE — Telephone Encounter (Signed)
Pharmacy wanted individual pills due to back order combination sent   [place]

## 2017-12-06 ENCOUNTER — Ambulatory Visit: Payer: Medicare Other | Admitting: Physical Therapy

## 2017-12-06 DIAGNOSIS — R278 Other lack of coordination: Secondary | ICD-10-CM

## 2017-12-06 DIAGNOSIS — F320 Major depressive disorder, single episode, mild: Secondary | ICD-10-CM | POA: Diagnosis not present

## 2017-12-06 DIAGNOSIS — R2689 Other abnormalities of gait and mobility: Secondary | ICD-10-CM

## 2017-12-06 DIAGNOSIS — M4125 Other idiopathic scoliosis, thoracolumbar region: Secondary | ICD-10-CM | POA: Diagnosis not present

## 2017-12-06 DIAGNOSIS — M2170 Unequal limb length (acquired), unspecified site: Secondary | ICD-10-CM | POA: Diagnosis not present

## 2017-12-06 NOTE — Therapy (Addendum)
[place] MAIN [facility] SERVICES [address] [place], [place], [address] Phone: [phone]   Fax:  [phone]  Physical Therapy Treatment / Progress Note   Patient Details  Name: Desiree Pittman MRN: [id number] Date of Birth: [birth year] No data recorded  Encounter Date: 12/06/2017  PT End of Session - 12/06/17 0913    Visit Number  5    Number of Visits  12    Date for PT Re-Evaluation  01/17/18    Authorization Type  Medicare     PT Start Time  0811    PT Stop Time  0902    PT Time Calculation (min)  51 min    Activity Tolerance  Patient tolerated treatment well;No increased pain    Behavior During Therapy  WFL for tasks assessed/performed       Past Medical History:  Diagnosis Date  . Abnormal Pap smear of cervix   . Allergy   . Anxiety   . Arthritis   . Blood in stool    colonoscopy 02/2017  . Chicken pox   . Colon polyps   . Depression   . Diabetes mellitus without complication ([place])    type 2  . Diverticulitis   . GERD (gastroesophageal reflux disease)    h/o ulcers ? GI   . Glaucoma    borderline   . Hyperlipidemia   . Hypertension   . PONV (postoperative nausea and vomiting)    pull tubes out,etc.    Past Surgical History:  Procedure Laterality Date  . ANAL RECTAL MANOMETRY N/A 03/08/2017   Procedure: ANO RECTAL MANOMETRY;  Surgeon: Ileana Roup, MD;  [facility];  Service: General;  Laterality: N/A;  . CARPAL TUNNEL RELEASE    . COLONOSCOPY WITH PROPOFOL N/A 03/07/2017   Procedure: COLONOSCOPY WITH PROPOFOL;  Surgeon: Ileana Roup, MD;  [facility];  Service: General;  Laterality: N/A;  . finergy surgery     cut finger sewn back on  . HAND SURGERY     ring finger left hand   . JOINT REPLACEMENT     left  hip  . TOTAL HIP ARTHROPLASTY  04/23/2012   Procedure: TOTAL HIP ARTHROPLASTY ANTERIOR APPROACH;  Surgeon: Gearlean Alf, MD;  [facility];  Service:  Orthopedics;  Laterality: Right;  . TOTAL HIP REVISION  04/23/2012   Procedure: TOTAL HIP REVISION;  Surgeon: Gearlean Alf, MD;  [facility];  Service: Orthopedics;  Laterality: Left;    There were no vitals filed for this visit.  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 Pt can feel her muscles lift and she can get to the bathroom without leaking. She takes the time to go the bathroom and not rush.     She stays more calm and think about other things. Pt also no longer has fecal leakage .      Pertinent History  Hip THA B, 2 vaginal deliveries with epiosiotomy and forceps,           [facility] PT Assessment - 12/06/17 0856      Coordination   Gross Motor Movements are Fluid and Coordinated  -- minor lumbopelvic pertubation w/ deep core level 2                Pelvic  Floor Special Questions - 12/06/17 0854    Pelvic Floor Internal Exam  pt consented verbally without contraindications    Exam Type  Rectal    Palpation  increased scar restrictions at 12  o clock  delayed lengthening     Strength  good squeeze, good lift, able to hold agaisnt strong resistance                     PT Long Term Goals - 12/06/17 1027      PT LONG TERM GOAL #1   Title  Pt will report decreased time with cleaning herself afer bowel movements from 25 min to < 15 min when at home in order to be efficient and  progress to using bathrooms in the public bathrooms     Time  8    Period  Weeks    Status  Achieved      PT LONG TERM GOAL #2   Title  Pt will decrease her pad wear from 4 x per day to < 2 x day and also report decreased soiling bed sheet by 50% across 1 week in order to improve QOL     Time  12    Period  Weeks    Status  Achieved      PT LONG TERM GOAL #3   Title  Pt will follow a bladder and bowel schedule in order to  minmize delaying urination / bowels and promote overall bowel and bladder health     Time  8    Period  Weeks    Status  Achieved      PT LONG TERM GOAL #4   Title  Improve stool consistency with a change in [place] Stool Type 5 occuring 75% of the time and Type 2-3 for  25% of the time to [facility] Stool Type Type 4-5 across 100% of the time in order to minimize hemorrhoids and straining   ( 7/26: pt has only had a little bleeding in the pad across the past 2 weeks. pt no longer has bleeding from hemorrhoids that show up in the toilet or dripping/ pour)       Time  12    Period  Weeks    Status  Partially Met      PT LONG TERM GOAL #5   Title  Pt will decrease her COREFO score from 48% to < 38% in order to improve GI system and QOL  ( 7/26: 24% )    Time  10    Status  Achieved      Additional Long Term Goals   Additional Long Term Goals  Yes      PT LONG TERM GOAL #6   Title  Pt will report decreased blood in toilet and bowels due to hemorrhoids from 1 x across 2 weeks to 1 x across 1 month in order to have less hemorrhoids and improved colorectal health     Time  8    Period  Weeks    Status  Achieved      PT LONG TERM GOAL #7   Title  Pt will report feeling decreased urinary urgency when standing/ walking during 2 shopping trips to the store in order to perform community activities     Time  8    Period  Weeks    Status  Achieved      PT LONG TERM GOAL #8   Title  Pt will report decreased muscus  leakage on the bed linens by 50% in order to demo proper pelvic function while sleeping     Time  12    Period  Weeks    Status  New    [place] Date  02/28/18      PT LONG TERM GOAL  #9   TITLE  Pt will demo proper body mechanics and modifications with lifting , bending and dragging 15-20 lbs crate, and weeding in order to garden and lifting pots.     Time  12    Period  Weeks    Status  New    Target Date  02/28/18            Plan - 12/06/17 0913    Clinical Impression  Statement Pt has achieved 6/9 goals and is progressing well towards remaining goals. Her Colorectal Functional Outcome Questionnaire score decreased significantly from 48% to 26%, signifying improve pelvic floor function. Pt no longer has excessive hemorrhoidal bleeding and is now working to minimize muscusal leakage.   Pt's urgency and urge incontinence have also improved as she is able to  work and shop with signficantly less frequent trips to the toilet. Pt has improved with proper pelvic floor coordination instead of bearing down / straining which will also continue to help with minimizing hemorrhoids. Pt demonstrates improved pelvic girdle alignment with manual Tx and shoe lift  which adjusted for her leg length difference. Pt's gait has improved. Pt also showed improved posture with less thoracic kyphosis and will be provided  scoliosis-specific HEP for overall mm balance and flexibility. Pt  continues to benefit from skilled PT.       Rehab Potential  Good    PT Frequency  1x / week    PT Duration  12 weeks    PT Treatment/Interventions  Aquatic Therapy;Therapeutic activities;Therapeutic exercise;Patient/family education;Balance training;Gait training;Moist Heat;Neuromuscular re-education;Manual techniques;Scar mobilization;Passive range of motion;Traction    Consulted and Agree with Plan of Care  Patient       Patient will benefit from skilled therapeutic intervention in order to improve the following deficits and impairments:  Postural dysfunction, Pain, Improper body mechanics, Impaired sensation, Increased fascial restricitons, Hypomobility, Decreased range of motion, Decreased endurance, Decreased strength, Decreased safety awareness, Difficulty walking, Decreased balance, Decreased mobility, Decreased coordination, Decreased scar mobility, Increased muscle spasms, Impaired flexibility  Visit Diagnosis:     Other abnormalities of gait and mobility  Other lack of  coordination  Unequal leg length  Other idiopathic scoliosis, thoracolumbar region    Problem List Patient Active Problem List   Diagnosis Date Noted  . Depression, major, single episode, mild ([place]) 04/12/2017  . Eczema 04/12/2017  . Essential hypertension 04/12/2017  . Anxiety 04/12/2017  . Alcohol abuse 04/12/2017  . Acute pain of right shoulder 04/12/2017  . Onychomycosis 04/12/2017  . Cardiac murmur 04/12/2017  . Intertrigo 04/12/2017  . Postop Acute blood loss anemia 04/24/2012  . Postop Hyponatremia 04/24/2012  . OA (osteoarthritis) of hip 04/23/2012    Jerl Mina ,PT, DPT, E-RYT  12/06/2017, 11:51 AM  [place] MAIN [facility] SERVICES [address] [place], [place], [address] Phone: [phone]   Fax:  [phone]  Name: Desiree Pittman MRN: [id number] Date of Birth: 1950/07/24

## 2017-12-06 NOTE — Addendum Note (Signed)
Addended by: Jerl Mina on: 12/06/2017 12:05 PM   Modules accepted: Orders

## 2017-12-06 NOTE — Patient Instructions (Signed)
PELVIC FLOOR / KEGEL EXERCISES   Pelvic floor/ Kegel exercises are used to strengthen the muscles in the base of your pelvis that are responsible for supporting your pelvic organs and preventing urine/feces leakage. Based on your therapist's recommendations, they can be performed while standing, sitting, or lying down. Imagine pelvic floor area as a diamond with pelvic landmarks: top =pubic bone, bottom tip=tailbone, sides=sitting bones (ischial tuberosities).    Make yourself aware of this muscle group by using these cues while coordinating your breath:  Inhale, feel pelvic floor diamond area lower like hammock towards your feet and ribcage/belly expanding. Pause. Let the exhale naturally and feel your belly sink, abdominal muscles hugging in around you and you may notice the pelvic diamond draws upward towards your head forming a umbrella shape. Give a squeeze during the exhalation like you are stopping the flow of urine. If you are squeezing the buttock muscles, try to give 50% less effort.   Common Errors:  Breath holding: If you are holding your breath, you may be bearing down against your bladder instead of pulling it up. If you belly bulges up while you are squeezing, you are holding your breath. Be sure to breathe gently in and out while exercising. Counting out loud may help you avoid holding your breath.  Accessory muscle use: You should not see or feel other muscle movement when performing pelvic floor exercises. When done properly, no one can tell that you are performing the exercises. Keep the buttocks, belly and inner thighs relaxed.  Overdoing it: Your muscles can fatigue and stop working for you if you over-exercise. You may actually leak more or feel soreness at the lower abdomen or rectum.  YOUR HOME EXERCISE PROGRAM  LONG HOLDS:   Position: on back or reclined in car seat ( do not lift head to sit up, instead make sure to use the handle to raise the car seat up and keep  spine/head relaxed to not place load on pelvic floor/ abdominal muscle)     Inhale and then exhale. Then squeeze the muscle and count aloud for 3 seconds. Rest with three long breaths. (Be sure to let belly sink in with exhales and not push outward)  Perform 5 repetitions, 3 times/day   EMPHASIZE less effort, not straining Ensure a pause between inhale and exhale                      DECREASE DOWNWARD PRESSURE ON  YOUR PELVIC FLOOR, ABDOMINAL, LOW BACK MUSCLES       PRESERVE YOUR PELVIC HEALTH LONG-TERM   ** SQUEEZE pelvic floor BEFORE YOUR SNEEZE, COUGH, LAUGH   ** EXHALE BEFORE YOU RISE AGAINST GRAVITY (lifting, sit to stand, from squat to stand)   ** LOG ROLL OUT OF BED INSTEAD OF CRUNCH/SIT-UP    _______________  WEB RESOURCE ON NUTRITION . Consult your GI doctor probiotics    [email or web address]  Some key points from the video...  -- If your client's IBS has resolved or is under control with nutrition and lifestyle (or she never had IBS), use a product with a combination of Lactobacillu rhamnosus GR-1 and Lactobacillus fermentum RC-14.  I recommend [employer] UltraFlora Womens OEMCertified.uy.asp?ProductsID=2128 or [employer] FemDophilus  -- Ideally, check to be sure that she is not dealing with an over abundance of lactobacillus strains, by looking at her organic acids testing.  The public can order this test onlne here: Organix Comprehensive Profile-[place] Kit https://www.estrada-salazar.info/.aspx  -- If she is dealing with  an excess of lactobacillus, promote diversity with a product with a variety of bifidobacterium strains, such as Ther-Biotic Factor 4 from [facility].  Then re-test in 3 months or so.  The vulvovaginal microbiome is distinct from the gut microbiome.  And, don't forget that supporting with good nutrition, movement, sleep, stress  management, and support are key!  Want to learn more about how to add a Functional Nutrition approach to taking care of your clients and patients with chronic pelvic pain?  Tune in to this free webinar: BidStrong.[email or web address]  __________   Less bending with weed pulling Sit on short stool

## 2017-12-20 ENCOUNTER — Ambulatory Visit: Payer: Medicare Other | Attending: Surgery | Admitting: Physical Therapy

## 2017-12-20 DIAGNOSIS — M4125 Other idiopathic scoliosis, thoracolumbar region: Secondary | ICD-10-CM | POA: Diagnosis not present

## 2017-12-20 DIAGNOSIS — M2170 Unequal limb length (acquired), unspecified site: Secondary | ICD-10-CM | POA: Diagnosis not present

## 2017-12-20 DIAGNOSIS — R278 Other lack of coordination: Secondary | ICD-10-CM | POA: Diagnosis not present

## 2017-12-20 DIAGNOSIS — R2689 Other abnormalities of gait and mobility: Secondary | ICD-10-CM | POA: Diagnosis not present

## 2017-12-20 DIAGNOSIS — M1712 Unilateral primary osteoarthritis, left knee: Secondary | ICD-10-CM | POA: Diagnosis not present

## 2017-12-20 NOTE — Patient Instructions (Signed)
Bridging series w/ RED resistive band other side of doorknob:  Level 1:  Position:  Elbows bent, knees hip width apart, heels under knees on top of stable  foot stool   Stabilization points: shoulders, upper arms, back of head pressed into floor. Heel press downward.   Movement: inhale do nothing, exhale pull band by side, lower fists to floor completely while lifting hips.Keep stabilization points engaged when you allow the band to go back to starting position  10 x 1 rep       Level 2:  Position:  Elbows straight, arms raised to ceiling at shoulder height, knees apart like a ballerina,heels together, heels under knees, on top of stable  foot stool   Stabilization points: shoulders, upper arms, back of head pressed into floor. Heel press downward.   Movement: inhale do nothing, exhale pull band by side, lower fists to floor completely while lifting hips. Keep stabilization points engaged when you allow the band to go back to starting position   10 x 1 rep  Shoulder training: Try to imagine you are squeezing a pencil under your armpit and your shoulder blades are down away from your ears and towards each other

## 2017-12-20 NOTE — Therapy (Addendum)
[place] MAIN [facility] SERVICES [address] [place], [place], [address] Phone: [phone]   Fax:  [phone]  Physical Therapy Treatment / Progress Note   Patient Details  Name: Desiree Pittman MRN: [id number] Date of Birth: [birth year]  Physical Therapy Progress Note   Dates of reporting period  10/25/17 to 12/20/17  Encounter Date: 12/20/2017  PT End of Session - 12/20/17 1203    Visit Number  6    Number of Visits  12    Date for PT Re-Evaluation  02/28/18    Authorization Type  Medicare     PT Start Time  0810    PT Stop Time  0905    PT Time Calculation (min)  55 min    Activity Tolerance  Patient tolerated treatment well;No increased pain    Behavior During Therapy  WFL for tasks assessed/performed       Past Medical History:  Diagnosis Date  . Abnormal Pap smear of cervix   . Allergy   . Anxiety   . Arthritis   . Blood in stool    colonoscopy 02/2017  . Chicken pox   . Colon polyps   . Depression   . Diabetes mellitus without complication ([place])    type 2  . Diverticulitis   . GERD (gastroesophageal reflux disease)    h/o ulcers ? GI   . Glaucoma    borderline   . Hyperlipidemia   . Hypertension   . PONV (postoperative nausea and vomiting)    pull tubes out,etc.    Past Surgical History:  Procedure Laterality Date  . ANAL RECTAL MANOMETRY N/A 03/08/2017   Procedure: ANO RECTAL MANOMETRY;  Surgeon: Ileana Roup, MD;  Location: [facility];  Service: General;  Laterality: N/A;  . CARPAL TUNNEL RELEASE    . COLONOSCOPY WITH PROPOFOL N/A 03/07/2017   Procedure: COLONOSCOPY WITH PROPOFOL;  Surgeon: Ileana Roup, MD;  Location: [facility];  Service: General;  Laterality: N/A;  . finergy surgery     cut finger sewn back on  . HAND SURGERY     ring finger left hand   . JOINT REPLACEMENT     left  hip  . TOTAL HIP ARTHROPLASTY  04/23/2012   Procedure: TOTAL HIP ARTHROPLASTY ANTERIOR APPROACH;   Surgeon: Gearlean Alf, MD;  Location: [facility];  Service: Orthopedics;  Laterality: Right;  . TOTAL HIP REVISION  04/23/2012   Procedure: TOTAL HIP REVISION;  Surgeon: Gearlean Alf, MD;  Location: [facility];  Service: Orthopedics;  Laterality: Left;    There were no vitals filed for this visit.  Subjective Assessment - 12/20/17 0824    Subjective  Pt will be seeing her orthopedic doctor today and would like to get rehab for her knee. She also would like to return to fitness and weight lifting in the future . She used to go to the gym ti work out.     Pertinent History  Hip THA B, 2 vaginal deliveries with epiosiotomy and forceps,           [facility] PT Assessment - 12/20/17 1202      Other:   Other/ Comments  bridging w/ rounded shoulders, poor alignment                    [facility] Adult PT Treatment/Exercise - 12/20/17 1202      Neuro Re-ed    Neuro Re-ed Details   see pt instructions, standing posture,  arch co-activation              PT Education - 12/20/17 0857    Education Details  HEP    Person(s) Educated  Patient    Methods  Explanation;Demonstration;Verbal cues;Handout;Tactile cues    Comprehension  Verbalized understanding;Returned demonstration          PT Long Term Goals - 12/20/17 0815      PT LONG TERM GOAL #1   Title  Pt will report decreased time with cleaning herself afer bowel movements from 25 min to < 15 min when at home in order to be efficient and  progress to using bathrooms in the public bathrooms     Time  8    Period  Weeks    Status  Achieved      PT LONG TERM GOAL #2   Title  Pt will decrease her pad wear from 4 x per day to < 2 x day and also report decreased soiling bed sheet by 50% across 1 week in order to improve QOL     Time  12    Period  Weeks    Status  Achieved      PT LONG TERM GOAL #3   Title  Pt will follow a bladder and bowel schedule in order to minmize delaying urination / bowels and promote overall bowel and  bladder health     Time  8    Period  Weeks    Status  Achieved      PT LONG TERM GOAL #4   Title  Improve stool consistency with a change in [place] Stool Type 5 occuring 75% of the time and Type 2-3 for  25% of the time to [facility] Stool Type Type 4-5 across 100% of the time in order to minimize hemorrhoids and straining   ( 7/26: pt has only had a little bleeding in the pad across the past 2 weeks. pt no longer has bleeding from hemorrhoids that show up in the toilet or dripping/ pour)       Time  12    Period  Weeks    Status  Achieved      PT LONG TERM GOAL #5   Title  Pt will decrease her COREFO score from 48% to < 38% in order to improve GI system and QOL  ( 7/26: 24% )    Time  10    Status  Achieved      Additional Long Term Goals   Additional Long Term Goals  Yes      PT LONG TERM GOAL #6   Title  Pt will report decreased blood in toilet and bowels due to hemorrhoids from 1 x across 2 weeks to 1 x across 1 month in order to have less hemorrhoids and improved colorectal health     Time  8    Period  Weeks    Status  Achieved      PT LONG TERM GOAL #7   Title  Pt will report feeling decreased urinary urgency when standing/ walking during 2 shopping trips to the store in order to perform community activities     Time  8    Period  Weeks    Status  Achieved      PT LONG TERM GOAL #8   Title  Pt will report decreased muscus leakage on the bed linens by 50% in order to demo proper pelvic function while sleeping  Time  12    Period  Weeks    Status  Partially Met      PT LONG TERM GOAL  #9   TITLE  Pt will demo proper body mechanics and modifications with lifting , bending and dragging 15-20 lbs crate, and weeding in order to garden and lifting pots.     Time  12    Period  Weeks    Status  Achieved      PT LONG TERM GOAL  #10   TITLE  Pt will complete 20 reps at each level  with red band using bridge core exercises ( level 1 and 2) without fatigue in order to improve  thoracoloumbar strengthening and minimize thoracic kyphosis     Baseline  10 reps     Time  8    Period  Weeks    Status  New    Target Date  02/14/18            Plan - 12/20/17 0817    Clinical Impression Statement  Pt has met 8/9 goals since [facility] Pt feels she has improved " A Very Great Deal Better" based on the GROC scale. Pt feels she is doing much, much better, healthier, and stronger because she holds herself up more. She doesn't slouch and doesn't walk woblly anymore. Pt no longer runs to the bathroom because she no longer have fecal leakage when getting to the toilet.   Pt no longer has urge incontinence and she goes to the toilet on a schedule rather delaying as indicated with decreased score on COREFO questionnaire.  Pt 's perineal scar restrictions and pelvic floor coordination and strength have improved which has contributed to improved fecal continence.   Pt showed improved posture but still need to decrease throacic kyphosis and rounded shoulders with thoraclumbar strengthening. Exercises today were provided to address this deficit which pt demo'd correctly following training. Pt's hip ext remains limited in bridging but anticipate will improve as thoracic extension increases. Pt will also benefit from scoliosis specific strengthening to minimizie relapse of Sx. Pt will be seeking a consult with her orthopedic surgeon for L knee surgery and plans on undergoing prehab for her knee. Plan to d/c from pelvic PT before she starts knee rehab. Pt continues to benefit form skilled PT to achieve remaining goals.         Rehab Potential  Good    PT Frequency  1x / week    PT Duration  12 weeks    PT Treatment/Interventions  Aquatic Therapy;Therapeutic activities;Therapeutic exercise;Patient/family education;Balance training;Gait training;Moist Heat;Neuromuscular re-education;Manual techniques;Scar mobilization;Passive range of motion;Traction    Consulted and Agree with Plan of Care   Patient       Patient will benefit from skilled therapeutic intervention in order to improve the following deficits and impairments:  Postural dysfunction, Pain, Improper body mechanics, Impaired sensation, Increased fascial restricitons, Hypomobility, Decreased range of motion, Decreased endurance, Decreased strength, Decreased safety awareness, Difficulty walking, Decreased balance, Decreased mobility, Decreased coordination, Decreased scar mobility, Increased muscle spasms, Impaired flexibility  Visit Diagnosis: Unequal leg length  Other idiopathic scoliosis, thoracolumbar region  Other lack of coordination  Other abnormalities of gait and mobility     Problem List Patient Active Problem List   Diagnosis Date Noted  . Depression, major, single episode, mild ([place]) 04/12/2017  . Eczema 04/12/2017  . Essential hypertension 04/12/2017  . Anxiety 04/12/2017  . Alcohol abuse 04/12/2017  . Acute pain of right shoulder 04/12/2017  .  Onychomycosis 04/12/2017  . Cardiac murmur 04/12/2017  . Intertrigo 04/12/2017  . Postop Acute blood loss anemia 04/24/2012  . Postop Hyponatremia 04/24/2012  . OA (osteoarthritis) of hip 04/23/2012    Jerl Mina ,PT, DPT, E-RYT  12/20/2017, 12:07 PM  [place] MAIN [facility] SERVICES [address] [place], [place], [address] Phone: [phone]   Fax:  [phone]  Name: Desiree Pittman MRN: [id number] Date of Birth: Apr 19, 1951

## 2018-01-03 ENCOUNTER — Encounter: Payer: Medicare Other | Admitting: Physical Therapy

## 2018-01-06 ENCOUNTER — Ambulatory Visit: Payer: Medicare Other | Admitting: Physical Therapy

## 2018-01-17 ENCOUNTER — Encounter: Payer: Medicare Other | Admitting: Physical Therapy

## 2018-01-17 ENCOUNTER — Encounter: Payer: Self-pay | Admitting: Internal Medicine

## 2018-01-17 ENCOUNTER — Ambulatory Visit (INDEPENDENT_AMBULATORY_CARE_PROVIDER_SITE_OTHER): Payer: Medicare Other | Admitting: Internal Medicine

## 2018-01-17 VITALS — BP 130/68 | HR 70 | Temp 98.4°F | Ht 62.0 in | Wt 118.8 lb

## 2018-01-17 DIAGNOSIS — Z01818 Encounter for other preprocedural examination: Secondary | ICD-10-CM

## 2018-01-17 DIAGNOSIS — I10 Essential (primary) hypertension: Secondary | ICD-10-CM

## 2018-01-17 DIAGNOSIS — E559 Vitamin D deficiency, unspecified: Secondary | ICD-10-CM

## 2018-01-17 DIAGNOSIS — Z1329 Encounter for screening for other suspected endocrine disorder: Secondary | ICD-10-CM

## 2018-01-17 DIAGNOSIS — Z1159 Encounter for screening for other viral diseases: Secondary | ICD-10-CM

## 2018-01-17 DIAGNOSIS — Z1231 Encounter for screening mammogram for malignant neoplasm of breast: Secondary | ICD-10-CM

## 2018-01-17 DIAGNOSIS — Z0184 Encounter for antibody response examination: Secondary | ICD-10-CM

## 2018-01-17 DIAGNOSIS — Z1389 Encounter for screening for other disorder: Secondary | ICD-10-CM

## 2018-01-17 MED ORDER — NIFEDIPINE ER 30 MG PO TB24: 30.0000 mg | ORAL_TABLET | Freq: Every day | ORAL | 3 refills | Status: DC

## 2018-01-17 MED ORDER — LOSARTAN POTASSIUM-HCTZ 50-12.5 MG PO TABS: 1.0000 | ORAL_TABLET | Freq: Every day | ORAL | 3 refills | Status: DC

## 2018-01-17 NOTE — Progress Notes (Signed)
Chief Complaint  Patient presents with  . Follow-up    Pre-op clearance   Pre op clearance  1. Pending TKR left knee with Dr. Alusio in [place] s/p needs forms filled out by pcp. She has pending labs 01/24/18 CBC, CMET, INR, type and screen  2. HTN controlled would like combo hyzaar 50 hctz 12.5 instead of taking individually 3. Toenail fungus improved following with podiatry as well  4. She completed pelvic floor PT and still doing at [facility] with Shin-Yin Yeung who has helped her so much (of note pt reduced etoh intake since death of her son and mood as better) as well as probiotics.   Review of Systems  Constitutional: Positive for weight loss.       Down 6 lbs   HENT: Negative for hearing loss.   Eyes: Negative for blurred vision.  Respiratory: Negative for shortness of breath.   Gastrointestinal: Negative for abdominal pain.  Musculoskeletal: Negative for falls.  Skin: Negative for rash.  Neurological: Negative for headaches.  Psychiatric/Behavioral: Negative for depression.   Past Medical History:  Diagnosis Date  . Abnormal Pap smear of cervix   . Allergy   . Anxiety   . Arthritis   . Blood in stool    colonoscopy 02/2017  . Chicken pox   . Colon polyps   . Depression   . Diabetes mellitus without complication ([facility])    type 2  . Diverticulitis   . GERD (gastroesophageal reflux disease)    h/o ulcers ? GI   . Glaucoma    borderline   . Heart murmur   . Hyperlipidemia   . Hypertension   . PONV (postoperative nausea and vomiting)    pull tubes out,etc.   Past Surgical History:  Procedure Laterality Date  . ANAL RECTAL MANOMETRY N/A 03/08/2017   Procedure: ANO RECTAL MANOMETRY;  Surgeon: White, Christopher M, MD;  [facility];  Service: General;  Laterality: N/A;  . CARPAL TUNNEL RELEASE    . COLONOSCOPY WITH PROPOFOL N/A 03/07/2017   Procedure: COLONOSCOPY WITH PROPOFOL;  Surgeon: White, Christopher M, MD;  [facility];  Service: General;   Laterality: N/A;  . finergy surgery     cut finger sewn back on  . HAND SURGERY     ring finger left hand   . JOINT REPLACEMENT     left  hip 2005, 2010, right hip 2010   . TOTAL HIP ARTHROPLASTY  04/23/2012   Procedure: TOTAL HIP ARTHROPLASTY ANTERIOR APPROACH;  Surgeon: Frank V Aluisio, MD;  [facility];  Service: Orthopedics;  Laterality: Right;  . TOTAL HIP REVISION  04/23/2012   Procedure: TOTAL HIP REVISION;  Surgeon: Frank V Aluisio, MD;  [facility];  Service: Orthopedics;  Laterality: Left;   Family History  Problem Relation Age of Onset  . Hypertension Mother   . Arthritis Mother   . Heart disease Mother   . Cancer Father        lung died age 54   . Alcohol abuse Son   . Cancer Other        colon caner    Social History   Socioeconomic History  . Marital status: Married    Spouse name: Not on file  . Number of children: Not on file  . Years of education: Not on file  . Highest education level: Not on file  Occupational History  . Not on file  Social Needs  . Financial resource strain: Not on file  .   Food insecurity:    Worry: Not on file    Inability: Not on file  . Transportation needs:    Medical: Not on file    Non-medical: Not on file  Tobacco Use  . Smoking status: Never Smoker  . Smokeless tobacco: Never Used  Substance and Sexual Activity  . Alcohol use: Yes    Alcohol/week: 6.0 - 10.0 standard drinks    Types: 6 - 10 Standard drinks or equivalent per week    Comment: 1 bottle qhs most nights x 1.5 years prior to 03/2017  . Drug use: No  . Sexual activity: Yes  Lifestyle  . Physical activity:    Days per week: Not on file    Minutes per session: Not on file  . Stress: Not on file  Relationships  . Social connections:    Talks on phone: Not on file    Gets together: Not on file    Attends religious service: Not on file    Active member of club or organization: Not on file    Attends meetings of clubs or organizations: Not on file     Relationship status: Not on file  . Intimate partner violence:    Fear of current or ex partner: Not on file    Emotionally abused: Not on file    Physically abused: Not on file    Forced sexual activity: Not on file  Other Topics Concern  . Not on file  Social History Narrative   Lives with her husband.   Her 47 year old son lived with them until 2015-08-21, when she found him dead in their home of a self-inflicted GSW/suicide    1 living daughter    2 grandsons    Works as Copywriter, advertising  Part time   Current Meds  Medication Sig  . hydrochlorothiazide (HYDRODIURIL) 12.5 MG tablet Take 1 tablet (12.5 mg total) by mouth daily.  Marland Kitchen losartan (COZAAR) 50 MG tablet Take 1 tablet (50 mg total) by mouth daily.  . meloxicam (MOBIC) 7.5 MG tablet Take 7.5 mg by mouth daily as needed for pain.   Marland Kitchen NIFEdipine (PROCARDIA-XL/ADALAT CC) 30 MG 24 hr tablet Take 1 tablet (30 mg total) by mouth daily.  . traMADol (ULTRAM) 50 MG tablet tramadol 50 mg tablet  TAKE 1 TO 2 TABLETS BY MOUTH TWICE A DAY AS NEEDED FOR PAIN  . [DISCONTINUED] losartan-hydrochlorothiazide (HYZAAR) 50-12.5 MG tablet Take 1 tablet by mouth every morning.  . [DISCONTINUED] Menthol-Zinc Oxide (CALMOSEPTINE) 0.44-20.6 % OINT Apply topically as needed.  . [DISCONTINUED] methylcellulose (CITRUCEL) oral powder Take by mouth daily.  . [DISCONTINUED] mometasone (ELOCON) 0.1 % cream mometasone 0.1 % topical cream  APPLY TO AFFECTED AREA 2 TIMES DAILY  . [DISCONTINUED] Multiple Vitamins-Minerals (MULTIVITAMIN WITH MINERALS) tablet Take 1 tablet by mouth daily.   . [DISCONTINUED] NIFEdipine (PROCARDIA-XL/ADALAT CC) 30 MG 24 hr tablet Take 1 tablet (30 mg total) by mouth daily.  . [DISCONTINUED] Polyethylene Glycol 3350 (MIRALAX PO) Take by mouth daily.  . [DISCONTINUED] sulfamethoxazole-trimethoprim (BACTRIM DS,SEPTRA DS) 800-160 MG tablet sulfamethoxazole 800 mg-trimethoprim 160 mg tablet  TAKE 1 TABLET BY MOUTH TWICE A DAY FOR 10 DAYS    Allergies  Allergen Reactions  . Latex     Skin irritation   . Metformin And Related     Diarrhea   . Propofol Nausea And Vomiting    Can do with nausea meds   No results found for this or any previous visit (from the  past 2160 hour(s)). Objective  Body mass index is 21.73 kg/m. Wt Readings from Last 3 Encounters:  01/17/18 118 lb 12.8 oz (53.9 kg)  06/24/17 126 lb 9.6 oz (57.4 kg)  04/12/17 125 lb 2 oz (56.8 kg)   Temp Readings from Last 3 Encounters:  01/17/18 98.4 F (36.9 C) (Oral)  06/24/17 98 F (36.7 C) (Oral)  04/12/17 98 F (36.7 C) (Oral)   BP Readings from Last 3 Encounters:  01/17/18 130/68  06/24/17 140/78  04/12/17 (!) 148/84   Pulse Readings from Last 3 Encounters:  01/17/18 70  06/24/17 68  04/12/17 86    Physical Exam  Constitutional: She is oriented to person, place, and time. Vital signs are normal. She appears well-developed and well-nourished. She is cooperative.  HENT:  Head: Normocephalic and atraumatic.  Mouth/Throat: Oropharynx is clear and moist and mucous membranes are normal.  Eyes: Pupils are equal, round, and reactive to light. Conjunctivae are normal.  Cardiovascular: Normal rate and regular rhythm.  Murmur heard. Pulmonary/Chest: Effort normal and breath sounds normal.  Neurological: She is alert and oriented to person, place, and time. Gait normal.  Skin: Skin is warm, dry and intact.  Psychiatric: She has a normal mood and affect. Her speech is normal and behavior is normal. Judgment and thought content normal. Cognition and memory are normal.  Nursing note and vitals reviewed.   Assessment   1. preop clearance left TKR Dr. Alusio in [place]  2. HTN  3. Onychomycosis  4. HM Plan   1. EKG today with TWI in V2 prev noted in 2013 in V2/V3 no chest pain today  Pending labs  Given copy of EKG today 01/17/18 and 2013  2. Refilled meds cont same meds though rx hyzaar 50-12.5  3.f/u podiatry  4.  Flu shot pt will get  Tdap  06/24/17 prevnar 10/16/17 pna 23 in 1 year  Disc shingrix perpt had old shingrix vaccine   Consider check hep B status in future and MMR  Pap get records [facility] obtained and reviewed.  -negative 07/29/16    mammo abnl 06/05/17 repeat neg 06/14/17 due 06/14/2018   Colonoscopy had 03/07/17 reviewed 2 mm polpy diverticula hyperplastic, Ext and Int Hemorrhoids Dr. Chris White f/u in 5 years   rec alcohol cessation she has cut back congratulated   Declines STD testing   Consider DEXA in future  referred dermatology Dr. Gould in [place] pt saw in 2019  Fasting labs 05/2018 f/u 07/2018   Provider: Dr. Tracy McLean-Scocuzza-Internal Medicine  

## 2018-01-17 NOTE — Progress Notes (Signed)
Pre visit review using our clinic review tool, if applicable. No additional management support is needed unless otherwise documented below in the visit note. 

## 2018-01-17 NOTE — Patient Instructions (Signed)
Good luck with surgery    Hypertension Hypertension, commonly called high blood pressure, is when the force of blood pumping through the arteries is too strong. The arteries are the blood vessels that carry blood from the heart throughout the body. Hypertension forces the heart to work harder to pump blood and may cause arteries to become narrow or stiff. Having untreated or uncontrolled hypertension can cause heart attacks, strokes, kidney disease, and other problems. A blood pressure reading consists of a higher number over a lower number. Ideally, your blood pressure should be below 120/80. The first ("top") number is called the systolic pressure. It is a measure of the pressure in your arteries as your heart beats. The second ("bottom") number is called the diastolic pressure. It is a measure of the pressure in your arteries as the heart relaxes. What are the causes? The cause of this condition is not known. What increases the risk? Some risk factors for high blood pressure are under your control. Others are not. Factors you can change  Smoking.  Having type 2 diabetes mellitus, high cholesterol, or both.  Not getting enough exercise or physical activity.  Being overweight.  Having too much fat, sugar, calories, or salt (sodium) in your diet.  Drinking too much alcohol. Factors that are difficult or impossible to change  Having chronic kidney disease.  Having a family history of high blood pressure.  Age. Risk increases with age.  Race. You may be at higher risk if you are African-American.  Gender. Men are at higher risk than women before age 48. After age 104, women are at higher risk than men.  Having obstructive sleep apnea.  Stress. What are the signs or symptoms? Extremely high blood pressure (hypertensive crisis) may cause:  Headache.  Anxiety.  Shortness of breath.  Nosebleed.  Nausea and vomiting.  Severe chest pain.  Jerky movements you cannot control  (seizures).  How is this diagnosed? This condition is diagnosed by measuring your blood pressure while you are seated, with your arm resting on a surface. The cuff of the blood pressure monitor will be placed directly against the skin of your upper arm at the level of your heart. It should be measured at least twice using the same arm. Certain conditions can cause a difference in blood pressure between your right and left arms. Certain factors can cause blood pressure readings to be lower or higher than normal (elevated) for a short period of time:  When your blood pressure is higher when you are in a health care provider's office than when you are at home, this is called white coat hypertension. Most people with this condition do not need medicines.  When your blood pressure is higher at home than when you are in a health care provider's office, this is called masked hypertension. Most people with this condition may need medicines to control blood pressure.  If you have a high blood pressure reading during one visit or you have normal blood pressure with other risk factors:  You may be asked to return on a different day to have your blood pressure checked again.  You may be asked to monitor your blood pressure at home for 1 week or longer.  If you are diagnosed with hypertension, you may have other blood or imaging tests to help your health care provider understand your overall risk for other conditions. How is this treated? This condition is treated by making healthy lifestyle changes, such as eating healthy foods, exercising  more, and reducing your alcohol intake. Your health care provider may prescribe medicine if lifestyle changes are not enough to get your blood pressure under control, and if:  Your systolic blood pressure is above 130.  Your diastolic blood pressure is above 80.  Your personal target blood pressure may vary depending on your medical conditions, your age, and other  factors. Follow these instructions at home: Eating and drinking  Eat a diet that is high in fiber and potassium, and low in sodium, added sugar, and fat. An example eating plan is called the DASH (Dietary Approaches to Stop Hypertension) diet. To eat this way: ? Eat plenty of fresh fruits and vegetables. Try to fill half of your plate at each meal with fruits and vegetables. ? Eat whole grains, such as whole wheat pasta, brown rice, or whole grain bread. Fill about one quarter of your plate with whole grains. ? Eat or drink low-fat dairy products, such as skim milk or low-fat yogurt. ? Avoid fatty cuts of meat, processed or cured meats, and poultry with skin. Fill about one quarter of your plate with lean proteins, such as fish, chicken without skin, beans, eggs, and tofu. ? Avoid premade and processed foods. These tend to be higher in sodium, added sugar, and fat.  Reduce your daily sodium intake. Most people with hypertension should eat less than 1,500 mg of sodium a day.  Limit alcohol intake to no more than 1 drink a day for nonpregnant women and 2 drinks a day for men. One drink equals 12 oz of beer, 5 oz of wine, or 1 oz of hard liquor. Lifestyle  Work with your health care provider to maintain a healthy body weight or to lose weight. Ask what an ideal weight is for you.  Get at least 30 minutes of exercise that causes your heart to beat faster (aerobic exercise) most days of the week. Activities may include walking, swimming, or biking.  Include exercise to strengthen your muscles (resistance exercise), such as pilates or lifting weights, as part of your weekly exercise routine. Try to do these types of exercises for 30 minutes at least 3 days a week.  Do not use any products that contain nicotine or tobacco, such as cigarettes and e-cigarettes. If you need help quitting, ask your health care provider.  Monitor your blood pressure at home as told by your health care provider.  Keep  all follow-up visits as told by your health care provider. This is important. Medicines  Take over-the-counter and prescription medicines only as told by your health care provider. Follow directions carefully. Blood pressure medicines must be taken as prescribed.  Do not skip doses of blood pressure medicine. Doing this puts you at risk for problems and can make the medicine less effective.  Ask your health care provider about side effects or reactions to medicines that you should watch for. Contact a health care provider if:  You think you are having a reaction to a medicine you are taking.  You have headaches that keep coming back (recurring).  You feel dizzy.  You have swelling in your ankles.  You have trouble with your vision. Get help right away if:  You develop a severe headache or confusion.  You have unusual weakness or numbness.  You feel faint.  You have severe pain in your chest or abdomen.  You vomit repeatedly.  You have trouble breathing. Summary  Hypertension is when the force of blood pumping through your arteries is  too strong. If this condition is not controlled, it may put you at risk for serious complications.  Your personal target blood pressure may vary depending on your medical conditions, your age, and other factors. For most people, a normal blood pressure is less than 120/80.  Hypertension is treated with lifestyle changes, medicines, or a combination of both. Lifestyle changes include weight loss, eating a healthy, low-sodium diet, exercising more, and limiting alcohol. This information is not intended to replace advice given to you by your health care provider. Make sure you discuss any questions you have with your health care provider. Document Released: 04/30/2005 Document Revised: 03/28/2016 Document Reviewed: 03/28/2016 Elsevier Interactive Patient Education  [employer].

## 2018-01-20 ENCOUNTER — Encounter: Payer: Self-pay | Admitting: Internal Medicine

## 2018-01-22 NOTE — Progress Notes (Signed)
Note has been faxed electronically 

## 2018-01-24 NOTE — H&P (Signed)
TOTAL KNEE ADMISSION H&P  Patient is being admitted for left total knee arthroplasty.  Subjective:  Chief Complaint:left knee pain.  HPI: Desiree Pittman, 67 y.o. female, has a history of pain and functional disability in the left knee due to arthritis and has failed non-surgical conservative treatments for greater than 12 weeks to includeNSAID's and/or analgesics, corticosteriod injections and activity modification.  Onset of symptoms was gradual, starting 10 years ago with gradually worsening course since that time. The patient noted no past surgery on the left knee(s).  Patient currently rates pain in the left knee(s) at 9 out of 10 with activity. Patient has worsening of pain with activity and weight bearing, crepitus, joint swelling and instability.  Patient has evidence of severe medial and patellofemoral bone-on-bone arthritis with large varus deformity on the left knee by imaging studies. There is no active infection.  Patient Active Problem List   Diagnosis Date Noted  . Depression, major, single episode, mild ([place]) 04/12/2017  . Eczema 04/12/2017  . Essential hypertension 04/12/2017  . Anxiety 04/12/2017  . Alcohol abuse 04/12/2017  . Acute pain of right shoulder 04/12/2017  . Onychomycosis 04/12/2017  . Cardiac murmur 04/12/2017  . Intertrigo 04/12/2017  . Postop Acute blood loss anemia 04/24/2012  . Postop Hyponatremia 04/24/2012  . OA (osteoarthritis) of hip 04/23/2012   Past Medical History:  Diagnosis Date  . Abnormal Pap smear of cervix   . Allergy   . Anxiety   . Arthritis   . Blood in stool    colonoscopy 02/2017  . Chicken pox   . Colon polyps   . Depression   . Diabetes mellitus without complication ([place])    type 2  . Diverticulitis   . GERD (gastroesophageal reflux disease)    h/o ulcers ? GI   . Glaucoma    borderline   . Heart murmur   . Hyperlipidemia   . Hypertension   . PONV (postoperative nausea and vomiting)    pull tubes out,etc.    Past  Surgical History:  Procedure Laterality Date  . ANAL RECTAL MANOMETRY N/A 03/08/2017   Procedure: ANO RECTAL MANOMETRY;  Surgeon: Ileana Roup, MD;  Location: [facility];  Service: General;  Laterality: N/A;  . CARPAL TUNNEL RELEASE    . COLONOSCOPY WITH PROPOFOL N/A 03/07/2017   Procedure: COLONOSCOPY WITH PROPOFOL;  Surgeon: Ileana Roup, MD;  Location: [facility];  Service: General;  Laterality: N/A;  . finergy surgery     cut finger sewn back on  . HAND SURGERY     ring finger left hand   . JOINT REPLACEMENT     left  hip 2005, 2010, right hip 2010   . TOTAL HIP ARTHROPLASTY  04/23/2012   Procedure: TOTAL HIP ARTHROPLASTY ANTERIOR APPROACH;  Surgeon: Gearlean Alf, MD;  Location: [facility];  Service: Orthopedics;  Laterality: Right;  . TOTAL HIP REVISION  04/23/2012   Procedure: TOTAL HIP REVISION;  Surgeon: Gearlean Alf, MD;  Location: [facility];  Service: Orthopedics;  Laterality: Left;    No current facility-administered medications for this encounter.    Current Outpatient Medications  Medication Sig Dispense Refill Last Dose  . hydrochlorothiazide (HYDRODIURIL) 12.5 MG tablet Take 1 tablet (12.5 mg total) by mouth daily. 90 tablet 0 Taking  . losartan (COZAAR) 50 MG tablet Take 1 tablet (50 mg total) by mouth daily. 90 tablet 0 Taking  . losartan-hydrochlorothiazide (HYZAAR) 50-12.5 MG tablet Take 1 tablet by mouth daily. 90 tablet 3   .  meloxicam (MOBIC) 7.5 MG tablet Take 7.5 mg by mouth daily as needed for pain.   2 Taking  . NIFEdipine (PROCARDIA-XL/ADALAT CC) 30 MG 24 hr tablet Take 1 tablet (30 mg total) by mouth daily. 90 tablet 3   . traMADol (ULTRAM) 50 MG tablet tramadol 50 mg tablet  TAKE 1 TO 2 TABLETS BY MOUTH TWICE A DAY AS NEEDED FOR PAIN   Taking   Allergies  Allergen Reactions  . Latex     Skin irritation   . Metformin And Related     Diarrhea   . Propofol Nausea And Vomiting    Can do with nausea meds    Social History   Tobacco  Use  . Smoking status: Never Smoker  . Smokeless tobacco: Never Used  Substance Use Topics  . Alcohol use: Yes    Alcohol/week: 6.0 - 10.0 standard drinks    Types: 6 - 10 Standard drinks or equivalent per week    Comment: 1 bottle qhs most nights x 1.5 years prior to 03/2017    Family History  Problem Relation Age of Onset  . Hypertension Mother   . Arthritis Mother   . Heart disease Mother   . Cancer Father        lung died age 16   . Alcohol abuse Son   . Cancer Other        colon caner      Review of Systems  Constitutional: Negative for chills and fever.  HENT: Negative for congestion, sore throat and tinnitus.   Eyes: Negative for double vision, photophobia and pain.  Respiratory: Negative for cough, shortness of breath and wheezing.   Cardiovascular: Negative for chest pain, palpitations and orthopnea.  Gastrointestinal: Negative for heartburn, nausea and vomiting.  Genitourinary: Negative for dysuria, frequency and urgency.  Musculoskeletal: Positive for joint pain.  Neurological: Negative for dizziness, weakness and headaches.  Psychiatric/Behavioral: Negative for depression.    Objective:  Physical Exam  Well nourished and well developed. General: Alert and oriented x3, cooperative and pleasant, no acute distress. Head: normocephalic, atraumatic, neck supple. Eyes: EOMI. Respiratory: breath sounds clear in all fields, no wheezing, rales, or rhonchi. Cardiovascular: Regular rate and rhythm, no murmurs, gallops or rubs.  Abdomen: non-tender to palpation and soft, normoactive bowel sounds. Musculoskeletal: Right Knee Exam: Tiny effusion.Slight varus deformity.Range of motion is 0-130 degrees. Slight crepitus on range of motion of the knee. No medial joint line tenderness. No lateral joint line tenderness. Stable knee. Left Knee Exam: Large effusion. Significant varus deformity. Range of motion is 0-125 degrees. No crepitus on range of motion of the knee. Severe  medial joint line tenderness. No lateral joint line tenderness. Stable knee. Calves soft and nontender. Motor function intact in LE. Strength 5/5 LE bilaterally. Neuro: Distal pulses 2+. Sensation to light touch intact in LE.  Vital signs in last 24 hours: Blood pressure: 130/68 mmHg Pulse: 70 bpm   Labs:   Estimated body mass index is 21.73 kg/m as calculated from the following:   Height as of 01/17/18: 5\' 2"  (1.575 m).   Weight as of 01/17/18: 53.9 kg.   Imaging Review Plain radiographs demonstrate severe degenerative joint disease of the left knee(s). The overall alignment issignificant varus. The bone quality appears to be adequate for age and reported activity level.   Preoperative templating of the joint replacement has been completed, documented, and submitted to the Operating Room personnel in order to optimize intra-operative equipment management.   Anticipated LOS  equal to or greater than 2 midnights due to - Age 1 and older with one or more of the following:  - Obesity  - Expected need for hospital services (PT, OT, Nursing) required for safe  discharge  - Anticipated need for postoperative skilled nursing care or inpatient rehab  - Active co-morbidities: None OR   - Unanticipated findings during/Post Surgery: None  - Patient is a high risk of re-admission due to: None     Assessment/Plan:  End stage arthritis, left knee   The patient history, physical examination, clinical judgment of the provider and imaging studies are consistent with end stage degenerative joint disease of the left knee(s) and total knee arthroplasty is deemed medically necessary. The treatment options including medical management, injection therapy arthroscopy and arthroplasty were discussed at length. The risks and benefits of total knee arthroplasty were presented and reviewed. The risks due to aseptic loosening, infection, stiffness, patella tracking problems, thromboembolic complications and  other imponderables were discussed. The patient acknowledged the explanation, agreed to proceed with the plan and consent was signed. Patient is being admitted for inpatient treatment for surgery, pain control, PT, OT, prophylactic antibiotics, VTE prophylaxis, progressive ambulation and ADL's and discharge planning. The patient is planning to be discharged home with virtual physical therapy.   Therapy Plans: Virtual physical therapy Disposition: Home with daughter and husband Planned DVT Prophylaxis: Aspirin 325 mg BID DME needed: None PCP: Dr. Terese Door (medical clearance provided) TXA: IV Allergies: Latex (rash) Anesthesia Concerns: nausea and vomiting with propofol Last HgbA1c: 6.2%  - Patient was instructed on what medications to stop prior to surgery. - Follow-up visit in 2 weeks with Dr. Wynelle Link - Begin physical therapy following surgery - Pre-operative lab work as pre-surgical testing - Prescriptions will be provided in hospital at time of discharge  Theresa Duty, PA-C Orthopedic Surgery [facility] [place]

## 2018-01-29 ENCOUNTER — Other Ambulatory Visit: Payer: Self-pay

## 2018-01-29 ENCOUNTER — Ambulatory Visit: Payer: Medicare Other | Attending: Surgery

## 2018-01-29 DIAGNOSIS — G8929 Other chronic pain: Secondary | ICD-10-CM | POA: Insufficient documentation

## 2018-01-29 DIAGNOSIS — M6281 Muscle weakness (generalized): Secondary | ICD-10-CM | POA: Insufficient documentation

## 2018-01-29 DIAGNOSIS — M25562 Pain in left knee: Secondary | ICD-10-CM | POA: Insufficient documentation

## 2018-01-29 NOTE — Patient Instructions (Addendum)
  Seated knee flexion/extension AAROM  Sitting on a chair or on your bed   Gently roll the ball towards you with your foot to feel a comfortable stretch bending at your knee. Hold for 5 seconds.    Gently roll the ball out to feel an extension stretch at your knee. Hold for 5 seconds.    Keep your thigh from wobbling.     Repeat 15 times.   Perform 2 sets daily .     Med[employer] Access Code: [email or web address]   Supine Quad Set on Towel Roll  Can put towel roll under leg 10x3 with 5 second holds  Seated Hamstring Curl with Anchored Resistance  1-2x10 daily   Seated Knee Extension with Resistance   2 lbs ankle weight 10x2

## 2018-01-29 NOTE — Therapy (Signed)
[facility] [address] [place], [address] Phone: [phone]   Fax:  [phone]  Physical Therapy Evaluation  Patient Details  Name: Desiree Pittman MRN: [id number] Date of Birth: 1951/04/05 Referring Provider: Gaynelle Arabian, MD   Encounter Date: 01/29/2018  PT End of Session - 01/29/18 1351    Visit Number  1    Number of Visits  2    Date for PT Re-Evaluation  02/12/18    Authorization Type  Medicare     PT Start Time  1352    PT Stop Time  1455    PT Time Calculation (min)  63 min    Activity Tolerance  Patient tolerated treatment well    Behavior During Therapy  [facility] for tasks assessed/performed       Past Medical History:  Diagnosis Date  . Abnormal Pap smear of cervix   . Allergy   . Anxiety   . Arthritis   . Blood in stool    colonoscopy 02/2017  . Chicken pox   . Colon polyps   . Depression   . Diabetes mellitus without complication ([place])    type 2  . Diverticulitis   . GERD (gastroesophageal reflux disease)    h/o ulcers ? GI   . Glaucoma    borderline   . Heart murmur   . Hyperlipidemia   . Hypertension   . PONV (postoperative nausea and vomiting)    pull tubes out,etc.    Past Surgical History:  Procedure Laterality Date  . ANAL RECTAL MANOMETRY N/A 03/08/2017   Procedure: ANO RECTAL MANOMETRY;  Surgeon: Ileana Roup, MD;  Location: [facility];  Service: General;  Laterality: N/A;  . CARPAL TUNNEL RELEASE    . COLONOSCOPY WITH PROPOFOL N/A 03/07/2017   Procedure: COLONOSCOPY WITH PROPOFOL;  Surgeon: Ileana Roup, MD;  Location: [facility];  Service: General;  Laterality: N/A;  . finergy surgery     cut finger sewn back on  . HAND SURGERY     ring finger left hand   . JOINT REPLACEMENT     left  hip 2005, 2010, right hip 2010   . TOTAL HIP ARTHROPLASTY  04/23/2012   Procedure: TOTAL HIP ARTHROPLASTY ANTERIOR APPROACH;  Surgeon: Gearlean Alf, MD;   Location: [facility];  Service: Orthopedics;  Laterality: Right;  . TOTAL HIP REVISION  04/23/2012   Procedure: TOTAL HIP REVISION;  Surgeon: Gearlean Alf, MD;  Location: [facility];  Service: Orthopedics;  Laterality: Left;    There were no vitals filed for this visit.   Subjective Assessment - 01/29/18 1355    Subjective  L knee: 7/10 currently (pt sitting), 7-8/10 most for the past month, 5/10 at best.     Pertinent History  Pt states that her knees swell up. Difficulty with walking due to pain. Scheduled for L TKA on 02/17/2018. Pt had prior PT for pelvic floor. Her therapist also worked on her L knee due to weakness. Has been getting shots for her L knee due to pain.  Also has hx of bilateral hip replacements. Pt wants to improve L knee strength prior to surgery.   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     Patient Stated Goals  Improve strength and ROM.     Currently in Pain?  Yes    Pain Score  7     Pain Location  Knee  Pain Orientation  Left    Pain Type  Chronic pain    Aggravating Factors   being active, walking about 100 ft, walking at the grocery store, going up steps, squatting    Pain Relieving Factors  sitting, ice, anti inflammatory medications         [facility] PT Assessment - 01/29/18 1407      Assessment   Medical Diagnosis  L knee OA    Referring Provider  Gaynelle Arabian, MD    Onset Date/Surgical Date  12/25/17   Date PT referral signed. Chronic condition   Next MD Visit  02/17/2018 for surgery    Prior Therapy  Pt participated in pelvic floor rehab with good results      Precautions   Precaution Comments  No known precautions      Restrictions   Other Position/Activity Restrictions  No known restrictions      Balance Screen   Has the patient fallen in the past 6 months  No    Has the patient had a decrease in activity level because of a fear of falling?   No    Is the patient reluctant to leave their home because of a fear of  falling?   No      Home Environment   Additional Comments  Pt lives in a 2 story home, first floor set up. L rail to get to second floor. Handicap ramps to enter front and back door. Has high commode, grab bars.       Posture/Postural Control   Posture Comments  Bilateral genu varus L > R      AROM   Right Knee Extension  -9    Right Knee Flexion  135    Left Knee Extension  -12   0 degrees supine quad set position   Left Knee Flexion  118   with pain (140 degrees supine AAROM)     Strength   Right Hip Extension  4-/5    Right Hip ABduction  4/5    Left Hip Extension  3+/5    Left Hip ABduction  4-/5    Right Knee Flexion  4/5    Right Knee Extension  5/5    Left Knee Flexion  4-/5    Left Knee Extension  5/5      Ambulation/Gait   Gait Comments  Decreased stance L LE with L lateral lean during L LE stance phase.                 Objective measurements completed on examination: See above findings.      2 session, then DC to surgery    L knee exercises pt performing: S/L clamshells, supine hip abduction with band resistance at ankles   Medbridge Access Code: [email or web address]   Therapeutic exercise  Supine quad set 10x2 with 5 second holds  Seated L knee flexion/extension AAROM with rolling ball 10x5 seconds each direction  Decreased L knee pain  Seated knee flexion resisting yellow band 10x  seated L knee extension 2 lbs 10x  reviewed HEP  Discharge next visit for surgery   Improved exercise technique, movement at target joints, use of target muscles after mod verbal, visual, tactile cues.    Patient is a 67 year old female who came to physical therapy secondary to L knee pain and to improve strength and ROM for her knee prior to her surgery. She also presents with limited L knee flexion and extension ROM, decreased L  LE strength, altered gait pattern and posture. Patient will benefit from skilled physical therapy services to address the aforementioned  deficits.       PT Education - 01/29/18 1930    Education Details  ther-ex, HEP, plan of care    Person(s) Educated  Patient    Methods  Explanation;Demonstration;Tactile cues;Verbal cues;Handout    Comprehension  Returned demonstration;Verbalized understanding       PT Short Term Goals - 01/29/18 1900      PT SHORT TERM GOAL #1   Title  Patient will be independent with her HEP to promote L knee ROM, and strength prior to her surgery.      Baseline  Pt started her HEP (01/29/2018)    Time  2    Period  Weeks    Status  New    Target Date  02/13/18        PT Long Term Goals - 01/29/18 1933      PT LONG TERM GOAL #1   Title                Plan - 01/29/18 1902    Clinical Impression Statement  Patient is a 67 year old female who came to physical therapy secondary to L knee pain and to improve strength and ROM for her knee prior to her surgery. She also presents with limited L knee flexion and extension ROM, decreased L LE strength, altered gait pattern and posture. Patient will benefit from skilled physical therapy services to address the aforementioned deficits.     History and Personal Factors relevant to plan of care:  Chronicity of condition, difficulty walking long distances such as at the grocery store, difficulty with stair negotiation, squatting. L knee pain, L LE weakness.     Clinical Presentation  Stable    Clinical Presentation due to:  pain has not improved    Clinical Decision Making  Low    Rehab Potential  Fair    Clinical Impairments Affecting Rehab Potential  (-) Chronicity of condition, pain; (+) motivated    PT Frequency  Other (comment)   1 follow up visit prior to surgery   PT Next Visit Plan  L knee ROM, strengthening, HEP    Consulted and Agree with Plan of Care  Patient       Patient will benefit from skilled therapeutic intervention in order to improve the following deficits and impairments:  Pain, Postural dysfunction, Improper body  mechanics, Difficulty walking, Decreased strength, Decreased range of motion  Visit Diagnosis: Chronic pain of left knee - Plan: PT plan of care cert/re-cert  Muscle weakness (generalized) - Plan: PT plan of care cert/re-cert     Problem List Patient Active Problem List   Diagnosis Date Noted  . Depression, major, single episode, mild ([place]) 04/12/2017  . Eczema 04/12/2017  . Essential hypertension 04/12/2017  . Anxiety 04/12/2017  . Alcohol abuse 04/12/2017  . Acute pain of right shoulder 04/12/2017  . Onychomycosis 04/12/2017  . Cardiac murmur 04/12/2017  . Intertrigo 04/12/2017  . Postop Acute blood loss anemia 04/24/2012  . Postop Hyponatremia 04/24/2012  . OA (osteoarthritis) of hip 04/23/2012   Joneen Boers PT, DPT   01/29/2018, 7:36 PM  [facility] [address] [place], [address] Phone: [phone]   Fax:  [phone]  Name: Alajia Schmelzer MRN: [id number] Date of Birth: [birth year]

## 2018-02-05 ENCOUNTER — Ambulatory Visit: Payer: Medicare Other

## 2018-02-10 NOTE — Progress Notes (Signed)
01/17/2018-Medical Clearance from Dr. Karlyn Agee on chart with office notes , EKG on chart and in Epic  04/14/2012- on chart EKG from Dr Olivia Mackie. Desiree Pittman

## 2018-02-10 NOTE — Patient Instructions (Addendum)
Desiree Pittman  02/10/2018   Your procedure is scheduled on: Monday 02/17/2018  Report to [facility] Main  Entrance              Report to admitting at  1010 AM    Call this number if you have problems the morning of surgery [phone]    Remember: Do not eat food or drink liquids :After Midnight.               BRUSH YOUR TEETH MORNING OF SURGERY AND RINSE YOUR MOUTH OUT, NO CHEWING GUM CANDY OR MINTS.     Take these medicines the morning of surgery with A SIP OF WATER: Nifedipine (Procardia-XT)                                 You may not have any metal on your body including hair pins and              piercings  Do not wear jewelry, make-up, lotions, powders or perfumes, deodorant             Do not wear nail polish.  Do not shave  48 hours prior to surgery.                Do not bring valuables to the hospital. [place].  Contacts, dentures or bridgework may not be worn into surgery.  Leave suitcase in the car. After surgery it may be brought to your room.                  Please read over the following fact sheets you were given: _____________________________________________________________________             [facility] - Preparing for Surgery Before surgery, you can play an important role.  Because skin is not sterile, your skin needs to be as free of germs as possible.  You can reduce the number of germs on your skin by washing with CHG (chlorahexidine gluconate) soap before surgery.  CHG is an antiseptic cleaner which kills germs and bonds with the skin to continue killing germs even after washing. Please DO NOT use if you have an allergy to CHG or antibacterial soaps.  If your skin becomes reddened/irritated stop using the CHG and inform your nurse when you arrive at Short Stay. Do not shave (including legs and underarms) for at least 48 hours prior to the first CHG shower.  You may shave  your face/neck. Please follow these instructions carefully:  1.  Shower with CHG Soap the night before surgery and the  morning of Surgery.  2.  If you choose to wash your hair, wash your hair first as usual with your  normal  shampoo.  3.  After you shampoo, rinse your hair and body thoroughly to remove the  shampoo.                           4.  Use CHG as you would any other liquid soap.  You can apply chg directly  to the skin and wash  Gently with a scrungie or clean washcloth.  5.  Apply the CHG Soap to your body ONLY FROM THE NECK DOWN.   Do not use on face/ open                           Wound or open sores. Avoid contact with eyes, ears mouth and genitals (private parts).                       Wash face,  Genitals (private parts) with your normal soap.             6.  Wash thoroughly, paying special attention to the area where your surgery  will be performed.  7.  Thoroughly rinse your body with warm water from the neck down.  8.  DO NOT shower/wash with your normal soap after using and rinsing off  the CHG Soap.                9.  Pat yourself dry with a clean towel.            10.  Wear clean pajamas.            11.  Place clean sheets on your bed the night of your first shower and do not  sleep with pets. Day of Surgery : Do not apply any lotions/deodorants the morning of surgery.  Please wear clean clothes to the hospital/surgery center.  FAILURE TO FOLLOW THESE INSTRUCTIONS MAY RESULT IN THE CANCELLATION OF YOUR SURGERY PATIENT SIGNATURE_________________________________  NURSE SIGNATURE__________________________________  ________________________________________________________________________   Adam Phenix  An incentive spirometer is a tool that can help keep your lungs clear and active. This tool measures how well you are filling your lungs with each breath. Taking long deep breaths may help reverse or decrease the chance of developing  breathing (pulmonary) problems (especially infection) following:  A long period of time when you are unable to move or be active. BEFORE THE PROCEDURE   If the spirometer includes an indicator to show your best effort, your nurse or respiratory therapist will set it to a desired goal.  If possible, sit up straight or lean slightly forward. Try not to slouch.  Hold the incentive spirometer in an upright position. INSTRUCTIONS FOR USE  1. Sit on the edge of your bed if possible, or sit up as far as you can in bed or on a chair. 2. Hold the incentive spirometer in an upright position. 3. Breathe out normally. 4. Place the mouthpiece in your mouth and seal your lips tightly around it. 5. Breathe in slowly and as deeply as possible, raising the piston or the ball toward the top of the column. 6. Hold your breath for 3-5 seconds or for as long as possible. Allow the piston or ball to fall to the bottom of the column. 7. Remove the mouthpiece from your mouth and breathe out normally. 8. Rest for a few seconds and repeat Steps 1 through 7 at least 10 times every 1-2 hours when you are awake. Take your time and take a few normal breaths between deep breaths. 9. The spirometer may include an indicator to show your best effort. Use the indicator as a goal to work toward during each repetition. 10. After each set of 10 deep breaths, practice coughing to be sure your lungs are clear. If you have an incision (the cut made at the time of surgery),  support your incision when coughing by placing a pillow or rolled up towels firmly against it. Once you are able to get out of bed, walk around indoors and cough well. You may stop using the incentive spirometer when instructed by your caregiver.  RISKS AND COMPLICATIONS  Take your time so you do not get dizzy or light-headed.  If you are in pain, you may need to take or ask for pain medication before doing incentive spirometry. It is harder to take a deep  breath if you are having pain. AFTER USE  Rest and breathe slowly and easily.  It can be helpful to keep track of a log of your progress. Your caregiver can provide you with a simple table to help with this. If you are using the spirometer at home, follow these instructions: [place] IF:   You are having difficultly using the spirometer.  You have trouble using the spirometer as often as instructed.  Your pain medication is not giving enough relief while using the spirometer.  You develop fever of 100.5 F (38.1 C) or higher. SEEK IMMEDIATE MEDICAL CARE IF:   You cough up bloody sputum that had not been present before.  You develop fever of 102 F (38.9 C) or greater.  You develop worsening pain at or near the incision site. MAKE SURE YOU:   Understand these instructions.  Will watch your condition.  Will get help right away if you are not doing well or get worse. Document Released: 09/10/2006 Document Revised: 07/23/2011 Document Reviewed: 11/11/2006 ExitCare Patient Information 2014 Exit[employer], [place].   ________________________________________________________________________  WHAT IS A BLOOD TRANSFUSION? Blood Transfusion Information  A transfusion is the replacement of blood or some of its parts. Blood is made up of multiple cells which provide different functions.  Red blood cells carry oxygen and are used for blood loss replacement.  White blood cells fight against infection.  Platelets control bleeding.  Plasma helps clot blood.  Other blood products are available for specialized needs, such as hemophilia or other clotting disorders. BEFORE THE TRANSFUSION  Who gives blood for transfusions?   Healthy volunteers who are fully evaluated to make sure their blood is safe. This is blood bank blood. Transfusion therapy is the safest it has ever been in the practice of medicine. Before blood is taken from a donor, a complete history is taken to make sure  that person has no history of diseases nor engages in risky social behavior (examples are intravenous drug use or sexual activity with multiple partners). The donor's travel history is screened to minimize risk of transmitting infections, such as malaria. The donated blood is tested for signs of infectious diseases, such as HIV and hepatitis. The blood is then tested to be sure it is compatible with you in order to minimize the chance of a transfusion reaction. If you or a relative donates blood, this is often done in anticipation of surgery and is not appropriate for emergency situations. It takes many days to process the donated blood. RISKS AND COMPLICATIONS Although transfusion therapy is very safe and saves many lives, the main dangers of transfusion include:   Getting an infectious disease.  Developing a transfusion reaction. This is an allergic reaction to something in the blood you were given. Every precaution is taken to prevent this. The decision to have a blood transfusion has been considered carefully by your caregiver before blood is given. Blood is not given unless the benefits outweigh the risks. AFTER THE TRANSFUSION  Right after receiving a blood transfusion, you will usually feel much better and more energetic. This is especially true if your red blood cells have gotten low (anemic). The transfusion raises the level of the red blood cells which carry oxygen, and this usually causes an energy increase.  The nurse administering the transfusion will monitor you carefully for complications. HOME CARE INSTRUCTIONS  No special instructions are needed after a transfusion. You may find your energy is better. Speak with your caregiver about any limitations on activity for underlying diseases you may have. SEEK MEDICAL CARE IF:   Your condition is not improving after your transfusion.  You develop redness or irritation at the intravenous (IV) site. SEEK IMMEDIATE MEDICAL CARE IF:  Any of  the following symptoms occur over the next 12 hours:  Shaking chills.  You have a temperature by mouth above 102 F (38.9 C), not controlled by medicine.  Chest, back, or muscle pain.  People around you feel you are not acting correctly or are confused.  Shortness of breath or difficulty breathing.  Dizziness and fainting.  You get a rash or develop hives.  You have a decrease in urine output.  Your urine turns a dark color or changes to pink, red, or brown. Any of the following symptoms occur over the next 10 days:  You have a temperature by mouth above 102 F (38.9 C), not controlled by medicine.  Shortness of breath.  Weakness after normal activity.  The white part of the eye turns yellow (jaundice).  You have a decrease in the amount of urine or are urinating less often.  Your urine turns a dark color or changes to pink, red, or brown. Document Released: 04/27/2000 Document Revised: 07/23/2011 Document Reviewed: 12/15/2007 [facility] Patient Information 2014 [place], [place].  _______________________________________________________________________

## 2018-02-12 ENCOUNTER — Other Ambulatory Visit (HOSPITAL_COMMUNITY): Payer: Medicare Other

## 2018-02-12 ENCOUNTER — Encounter (HOSPITAL_COMMUNITY)
Admission: RE | Admit: 2018-02-12 | Discharge: 2018-02-12 | Disposition: A | Discharge status: Home / Self Care | Payer: Medicare Other | Source: Ambulatory Visit | Attending: Orthopedic Surgery | Admitting: Orthopedic Surgery

## 2018-02-12 ENCOUNTER — Other Ambulatory Visit: Payer: Self-pay

## 2018-02-12 ENCOUNTER — Encounter (HOSPITAL_COMMUNITY): Payer: Self-pay

## 2018-02-12 DIAGNOSIS — Z01812 Encounter for preprocedural laboratory examination: Secondary | ICD-10-CM | POA: Insufficient documentation

## 2018-02-12 DIAGNOSIS — Z23 Encounter for immunization: Secondary | ICD-10-CM | POA: Diagnosis not present

## 2018-02-12 DIAGNOSIS — L570 Actinic keratosis: Secondary | ICD-10-CM | POA: Diagnosis not present

## 2018-02-12 DIAGNOSIS — D225 Melanocytic nevi of trunk: Secondary | ICD-10-CM | POA: Diagnosis not present

## 2018-02-12 DIAGNOSIS — L821 Other seborrheic keratosis: Secondary | ICD-10-CM | POA: Diagnosis not present

## 2018-02-12 DIAGNOSIS — L814 Other melanin hyperpigmentation: Secondary | ICD-10-CM | POA: Diagnosis not present

## 2018-02-12 LAB — COMPREHENSIVE METABOLIC PANEL
ALT: 16 U/L (ref 0–44)
AST: 21 U/L (ref 15–41)
Albumin: 3.8 g/dL (ref 3.5–5.0)
Alkaline Phosphatase: 57 U/L (ref 38–126)
Anion gap: 8 (ref 5–15)
BUN: 14 mg/dL (ref 8–23)
CHLORIDE: 108 mmol/L (ref 98–111)
CO2: 28 mmol/L (ref 22–32)
CREATININE: 0.6 mg/dL (ref 0.44–1.00)
Calcium: 10 mg/dL (ref 8.9–10.3)
GFR calc Af Amer: >60 mL/min (ref >60)
GFR calc non Af Amer: >60 mL/min (ref >60)
Glucose, Bld: 114 mg/dL — ABNORMAL HIGH (ref 70–99)
POTASSIUM: 4.7 mmol/L (ref 3.5–5.1)
SODIUM: 144 mmol/L (ref 135–145)
TOTAL PROTEIN: 6.5 g/dL (ref 6.5–8.1)
Total Bilirubin: 0.8 mg/dL (ref 0.3–1.2)

## 2018-02-12 LAB — SURGICAL PCR SCREEN
MRSA, PCR: NEGATIVE
STAPHYLOCOCCUS AUREUS: POSITIVE — AB

## 2018-02-12 LAB — CBC
HCT: 43.6 % (ref 36.0–46.0)
Hemoglobin: 14.5 g/dL (ref 12.0–15.0)
MCH: 32.7 pg (ref 26.0–34.0)
MCHC: 33.3 g/dL (ref 30.0–36.0)
MCV: 98.4 fL (ref 78.0–100.0)
PLATELETS: 218 10*3/uL (ref 150–400)
RBC: 4.43 MIL/uL (ref 3.87–5.11)
RDW: 13.6 % (ref 11.5–15.5)
WBC: 4.6 10*3/uL (ref 4.0–10.5)

## 2018-02-12 LAB — APTT: APTT: 29 s (ref 24–36)

## 2018-02-12 LAB — PROTIME-INR
INR: 0.85
Prothrombin Time: 11.6 seconds (ref 11.4–15.2)

## 2018-02-12 LAB — GLUCOSE, CAPILLARY: Glucose-Capillary: 100 mg/dL — ABNORMAL HIGH (ref 70–99)

## 2018-02-12 NOTE — Progress Notes (Signed)
Left chart with Jeral Fruit, RN to follow up on lab results!

## 2018-02-13 LAB — HEMOGLOBIN A1C
Hgb A1c MFr Bld: 5.5 % (ref 4.8–5.6)
Mean Plasma Glucose: 111 mg/dL

## 2018-02-16 MED ORDER — BUPIVACAINE LIPOSOME 1.3 % IJ SUSP
20.0000 mL | Freq: Once | INTRAMUSCULAR | Status: DC
  Filled 2018-02-16: qty 20

## 2018-02-17 ENCOUNTER — Inpatient Hospital Stay (HOSPITAL_COMMUNITY): Payer: Medicare Other | Admitting: Registered Nurse

## 2018-02-17 ENCOUNTER — Encounter (HOSPITAL_COMMUNITY): Payer: Self-pay | Admitting: *Deleted

## 2018-02-17 ENCOUNTER — Other Ambulatory Visit: Payer: Self-pay

## 2018-02-17 ENCOUNTER — Inpatient Hospital Stay (HOSPITAL_COMMUNITY)
Admission: RE | Admit: 2018-02-17 | Discharge: 2018-02-19 | DRG: 470 | Disposition: A | Discharge status: Home / Self Care | Payer: Medicare Other | Source: Ambulatory Visit | Attending: Orthopedic Surgery | Admitting: Orthopedic Surgery

## 2018-02-17 ENCOUNTER — Other Ambulatory Visit: Payer: Self-pay | Admitting: Orthopedic Surgery

## 2018-02-17 ENCOUNTER — Encounter (HOSPITAL_COMMUNITY)
Admission: RE | Disposition: A | Discharge status: Home / Self Care | Payer: Self-pay | Source: Ambulatory Visit | Attending: Orthopedic Surgery

## 2018-02-17 DIAGNOSIS — Z8261 Family history of arthritis: Secondary | ICD-10-CM | POA: Diagnosis not present

## 2018-02-17 DIAGNOSIS — Z91040 Latex allergy status: Secondary | ICD-10-CM | POA: Diagnosis not present

## 2018-02-17 DIAGNOSIS — M1711 Unilateral primary osteoarthritis, right knee: Secondary | ICD-10-CM | POA: Diagnosis present

## 2018-02-17 DIAGNOSIS — I10 Essential (primary) hypertension: Secondary | ICD-10-CM | POA: Diagnosis present

## 2018-02-17 DIAGNOSIS — Z96643 Presence of artificial hip joint, bilateral: Secondary | ICD-10-CM | POA: Diagnosis present

## 2018-02-17 DIAGNOSIS — Z800 Family history of malignant neoplasm of digestive organs: Secondary | ICD-10-CM

## 2018-02-17 DIAGNOSIS — Z888 Allergy status to other drugs, medicaments and biological substances status: Secondary | ICD-10-CM | POA: Diagnosis not present

## 2018-02-17 DIAGNOSIS — Z884 Allergy status to anesthetic agent status: Secondary | ICD-10-CM | POA: Diagnosis not present

## 2018-02-17 DIAGNOSIS — K219 Gastro-esophageal reflux disease without esophagitis: Secondary | ICD-10-CM | POA: Diagnosis present

## 2018-02-17 DIAGNOSIS — E785 Hyperlipidemia, unspecified: Secondary | ICD-10-CM | POA: Diagnosis present

## 2018-02-17 DIAGNOSIS — Z8249 Family history of ischemic heart disease and other diseases of the circulatory system: Secondary | ICD-10-CM | POA: Diagnosis not present

## 2018-02-17 DIAGNOSIS — M1712 Unilateral primary osteoarthritis, left knee: Principal | ICD-10-CM | POA: Diagnosis present

## 2018-02-17 DIAGNOSIS — H409 Unspecified glaucoma: Secondary | ICD-10-CM | POA: Diagnosis present

## 2018-02-17 DIAGNOSIS — E119 Type 2 diabetes mellitus without complications: Secondary | ICD-10-CM | POA: Diagnosis present

## 2018-02-17 DIAGNOSIS — M25562 Pain in left knee: Secondary | ICD-10-CM | POA: Diagnosis not present

## 2018-02-17 DIAGNOSIS — G8918 Other acute postprocedural pain: Secondary | ICD-10-CM | POA: Diagnosis not present

## 2018-02-17 DIAGNOSIS — Z86010 Personal history of colonic polyps: Secondary | ICD-10-CM

## 2018-02-17 DIAGNOSIS — M179 Osteoarthritis of knee, unspecified: Secondary | ICD-10-CM | POA: Diagnosis present

## 2018-02-17 DIAGNOSIS — Z801 Family history of malignant neoplasm of trachea, bronchus and lung: Secondary | ICD-10-CM | POA: Diagnosis not present

## 2018-02-17 DIAGNOSIS — M1710 Unilateral primary osteoarthritis, unspecified knee: Secondary | ICD-10-CM | POA: Diagnosis present

## 2018-02-17 HISTORY — PX: TOTAL KNEE ARTHROPLASTY: SHX125

## 2018-02-17 LAB — TYPE AND SCREEN
ABO/RH(D): A POS
ANTIBODY SCREEN: NEGATIVE

## 2018-02-17 LAB — GLUCOSE, CAPILLARY: GLUCOSE-CAPILLARY: 104 mg/dL — AB (ref 70–99)

## 2018-02-17 SURGERY — ARTHROPLASTY, KNEE, TOTAL
Anesthesia: Spinal | Site: Knee | Laterality: Left

## 2018-02-17 MED ORDER — ONDANSETRON HCL 4 MG/2ML IJ SOLN
INTRAMUSCULAR | Status: AC
  Filled 2018-02-17: qty 2

## 2018-02-17 MED ORDER — DIPHENHYDRAMINE HCL 12.5 MG/5ML PO ELIX: 12.5000 mg | ORAL_SOLUTION | ORAL | Status: DC | PRN

## 2018-02-17 MED ORDER — METHOCARBAMOL 500 MG PO TABS
500.0000 mg | ORAL_TABLET | Freq: Four times a day (QID) | ORAL | Status: DC | PRN
  Administered 2018-02-17 – 2018-02-19 (×4): 500 mg via ORAL
  Filled 2018-02-17 (×4): qty 1

## 2018-02-17 MED ORDER — GABAPENTIN 300 MG PO CAPS
300.0000 mg | ORAL_CAPSULE | Freq: Three times a day (TID) | ORAL | Status: DC
  Administered 2018-02-17 – 2018-02-19 (×6): 300 mg via ORAL
  Filled 2018-02-17 (×6): qty 1

## 2018-02-17 MED ORDER — BUPIVACAINE IN DEXTROSE 0.75-8.25 % IT SOLN
INTRATHECAL | Status: DC | PRN
  Administered 2018-02-17: 1.6 mL via INTRATHECAL

## 2018-02-17 MED ORDER — ACETAMINOPHEN 10 MG/ML IV SOLN
1000.0000 mg | Freq: Four times a day (QID) | INTRAVENOUS | Status: DC
  Administered 2018-02-17: 1000 mg via INTRAVENOUS
  Filled 2018-02-17: qty 100

## 2018-02-17 MED ORDER — POLYETHYLENE GLYCOL 3350 17 G PO PACK
17.0000 g | PACK | Freq: Every day | ORAL | Status: DC | PRN
  Filled 2018-02-17: qty 1

## 2018-02-17 MED ORDER — HYDROMORPHONE HCL 1 MG/ML IJ SOLN: 0.5000 mg | INTRAMUSCULAR | Status: DC | PRN

## 2018-02-17 MED ORDER — METOCLOPRAMIDE HCL 5 MG/ML IJ SOLN: 5.0000 mg | Freq: Three times a day (TID) | INTRAMUSCULAR | Status: DC | PRN

## 2018-02-17 MED ORDER — MIDAZOLAM HCL 2 MG/2ML IJ SOLN
1.0000 mg | INTRAMUSCULAR | Status: DC
  Administered 2018-02-17: 2 mg via INTRAVENOUS
  Filled 2018-02-17: qty 2

## 2018-02-17 MED ORDER — NIFEDIPINE ER OSMOTIC RELEASE 30 MG PO TB24
30.0000 mg | ORAL_TABLET | Freq: Every day | ORAL | Status: DC
  Administered 2018-02-18 – 2018-02-19 (×2): 30 mg via ORAL
  Filled 2018-02-17 (×2): qty 1

## 2018-02-17 MED ORDER — DEXAMETHASONE SODIUM PHOSPHATE 10 MG/ML IJ SOLN
8.0000 mg | Freq: Once | INTRAMUSCULAR | Status: AC
  Administered 2018-02-17: 8 mg via INTRAVENOUS

## 2018-02-17 MED ORDER — OXYCODONE HCL 5 MG PO TABS
10.0000 mg | ORAL_TABLET | ORAL | Status: DC | PRN
  Administered 2018-02-18 – 2018-02-19 (×5): 10 mg via ORAL

## 2018-02-17 MED ORDER — PROPOFOL 500 MG/50ML IV EMUL
INTRAVENOUS | Status: DC | PRN
  Administered 2018-02-17: 40 ug/kg/min via INTRAVENOUS

## 2018-02-17 MED ORDER — TRANEXAMIC ACID 1000 MG/10ML IV SOLN
1000.0000 mg | INTRAVENOUS | Status: AC
  Administered 2018-02-17: 1000 mg via INTRAVENOUS
  Filled 2018-02-17: qty 10

## 2018-02-17 MED ORDER — SODIUM CHLORIDE 0.9 % IJ SOLN
INTRAMUSCULAR | Status: AC
  Filled 2018-02-17: qty 10

## 2018-02-17 MED ORDER — TRANEXAMIC ACID 1000 MG/10ML IV SOLN
1000.0000 mg | Freq: Once | INTRAVENOUS | Status: AC
  Administered 2018-02-17: 1000 mg via INTRAVENOUS
  Filled 2018-02-17: qty 1000

## 2018-02-17 MED ORDER — EPHEDRINE SULFATE-NACL 50-0.9 MG/10ML-% IV SOSY
PREFILLED_SYRINGE | INTRAVENOUS | Status: DC | PRN
  Administered 2018-02-17 (×2): 5 mg via INTRAVENOUS

## 2018-02-17 MED ORDER — CHLORHEXIDINE GLUCONATE 4 % EX LIQD: 60.0000 mL | Freq: Once | CUTANEOUS | Status: DC

## 2018-02-17 MED ORDER — DEXAMETHASONE SODIUM PHOSPHATE 10 MG/ML IJ SOLN
10.0000 mg | Freq: Once | INTRAMUSCULAR | Status: DC
  Filled 2018-02-17: qty 1

## 2018-02-17 MED ORDER — PROPOFOL 10 MG/ML IV BOLUS
INTRAVENOUS | Status: AC
  Filled 2018-02-17: qty 40

## 2018-02-17 MED ORDER — ONDANSETRON HCL 4 MG/2ML IJ SOLN: 4.0000 mg | Freq: Four times a day (QID) | INTRAMUSCULAR | Status: DC | PRN

## 2018-02-17 MED ORDER — LACTATED RINGERS IV SOLN
INTRAVENOUS | Status: DC
  Administered 2018-02-17: 11:00:00 via INTRAVENOUS

## 2018-02-17 MED ORDER — ACETAMINOPHEN 500 MG PO TABS
1000.0000 mg | ORAL_TABLET | Freq: Four times a day (QID) | ORAL | Status: AC
  Administered 2018-02-17 – 2018-02-18 (×4): 1000 mg via ORAL
  Filled 2018-02-17 (×5): qty 2

## 2018-02-17 MED ORDER — PHENOL 1.4 % MT LIQD: 1.0000 | OROMUCOSAL | Status: DC | PRN

## 2018-02-17 MED ORDER — HYDROCHLOROTHIAZIDE 25 MG PO TABS
12.5000 mg | ORAL_TABLET | Freq: Every day | ORAL | Status: DC
  Administered 2018-02-18 – 2018-02-19 (×2): 12.5 mg via ORAL
  Filled 2018-02-17 (×2): qty 1

## 2018-02-17 MED ORDER — FLEET ENEMA 7-19 GM/118ML RE ENEM: 1.0000 | ENEMA | Freq: Once | RECTAL | Status: DC | PRN

## 2018-02-17 MED ORDER — LOSARTAN POTASSIUM 50 MG PO TABS
50.0000 mg | ORAL_TABLET | Freq: Every day | ORAL | Status: DC
  Administered 2018-02-18 – 2018-02-19 (×2): 50 mg via ORAL
  Filled 2018-02-17 (×2): qty 1

## 2018-02-17 MED ORDER — MENTHOL 3 MG MT LOZG: 1.0000 | LOZENGE | OROMUCOSAL | Status: DC | PRN

## 2018-02-17 MED ORDER — FENTANYL CITRATE (PF) 100 MCG/2ML IJ SOLN
50.0000 ug | INTRAMUSCULAR | Status: DC
  Administered 2018-02-17: 50 ug via INTRAVENOUS
  Filled 2018-02-17: qty 2

## 2018-02-17 MED ORDER — DOCUSATE SODIUM 100 MG PO CAPS
100.0000 mg | ORAL_CAPSULE | Freq: Two times a day (BID) | ORAL | Status: DC
  Administered 2018-02-17 – 2018-02-19 (×4): 100 mg via ORAL
  Filled 2018-02-17 (×4): qty 1

## 2018-02-17 MED ORDER — EPHEDRINE 5 MG/ML INJ
INTRAVENOUS | Status: AC
  Filled 2018-02-17: qty 10

## 2018-02-17 MED ORDER — GABAPENTIN 300 MG PO CAPS
300.0000 mg | ORAL_CAPSULE | Freq: Once | ORAL | Status: AC
  Administered 2018-02-17: 300 mg via ORAL
  Filled 2018-02-17: qty 1

## 2018-02-17 MED ORDER — ASPIRIN EC 325 MG PO TBEC
325.0000 mg | DELAYED_RELEASE_TABLET | Freq: Two times a day (BID) | ORAL | Status: DC
  Administered 2018-02-18 – 2018-02-19 (×3): 325 mg via ORAL
  Filled 2018-02-17 (×3): qty 1

## 2018-02-17 MED ORDER — METHOCARBAMOL 500 MG IVPB - SIMPLE MED
INTRAVENOUS | Status: AC
  Filled 2018-02-17: qty 50

## 2018-02-17 MED ORDER — CEFAZOLIN SODIUM-DEXTROSE 2-4 GM/100ML-% IV SOLN
2.0000 g | INTRAVENOUS | Status: AC
  Administered 2018-02-17: 2 g via INTRAVENOUS
  Filled 2018-02-17: qty 100

## 2018-02-17 MED ORDER — SODIUM CHLORIDE 0.9 % IJ SOLN
INTRAMUSCULAR | Status: AC
  Filled 2018-02-17: qty 50

## 2018-02-17 MED ORDER — ONDANSETRON HCL 4 MG PO TABS: 4.0000 mg | ORAL_TABLET | Freq: Four times a day (QID) | ORAL | Status: DC | PRN

## 2018-02-17 MED ORDER — LORATADINE 10 MG PO TABS
10.0000 mg | ORAL_TABLET | Freq: Every day | ORAL | Status: DC
  Administered 2018-02-19: 10 mg via ORAL
  Filled 2018-02-17 (×2): qty 1

## 2018-02-17 MED ORDER — DEXAMETHASONE SODIUM PHOSPHATE 10 MG/ML IJ SOLN
INTRAMUSCULAR | Status: AC
  Filled 2018-02-17: qty 1

## 2018-02-17 MED ORDER — STERILE WATER FOR IRRIGATION IR SOLN
Status: DC | PRN
  Administered 2018-02-17: 2000 mL

## 2018-02-17 MED ORDER — CEFAZOLIN SODIUM-DEXTROSE 1-4 GM/50ML-% IV SOLN
1.0000 g | Freq: Four times a day (QID) | INTRAVENOUS | Status: AC
  Administered 2018-02-17 – 2018-02-18 (×2): 1 g via INTRAVENOUS
  Filled 2018-02-17 (×2): qty 50

## 2018-02-17 MED ORDER — SODIUM CHLORIDE 0.9 % IV SOLN
INTRAVENOUS | Status: DC
  Administered 2018-02-17 – 2018-02-18 (×2): via INTRAVENOUS

## 2018-02-17 MED ORDER — METOCLOPRAMIDE HCL 5 MG PO TABS: 5.0000 mg | ORAL_TABLET | Freq: Three times a day (TID) | ORAL | Status: DC | PRN

## 2018-02-17 MED ORDER — BUPIVACAINE LIPOSOME 1.3 % IJ SUSP
INTRAMUSCULAR | Status: DC | PRN
  Administered 2018-02-17: 20 mL

## 2018-02-17 MED ORDER — BISACODYL 10 MG RE SUPP: 10.0000 mg | Freq: Every day | RECTAL | Status: DC | PRN

## 2018-02-17 MED ORDER — OXYCODONE HCL 5 MG PO TABS
5.0000 mg | ORAL_TABLET | ORAL | Status: DC | PRN
  Administered 2018-02-17: 10 mg via ORAL
  Administered 2018-02-17: 5 mg via ORAL
  Administered 2018-02-19: 10 mg via ORAL
  Filled 2018-02-17: qty 2
  Filled 2018-02-17: qty 1
  Filled 2018-02-17 (×6): qty 2

## 2018-02-17 MED ORDER — ROPIVACAINE HCL 7.5 MG/ML IJ SOLN
INTRAMUSCULAR | Status: DC | PRN
  Administered 2018-02-17: 20 mL via PERINEURAL

## 2018-02-17 MED ORDER — ONDANSETRON HCL 4 MG/2ML IJ SOLN
INTRAMUSCULAR | Status: DC | PRN
  Administered 2018-02-17: 4 mg via INTRAVENOUS

## 2018-02-17 MED ORDER — METHOCARBAMOL 500 MG IVPB - SIMPLE MED
500.0000 mg | Freq: Four times a day (QID) | INTRAVENOUS | Status: DC | PRN
  Administered 2018-02-17: 500 mg via INTRAVENOUS
  Filled 2018-02-17: qty 50

## 2018-02-17 MED ORDER — SODIUM CHLORIDE 0.9 % IR SOLN
Status: DC | PRN
  Administered 2018-02-17: 1000 mL

## 2018-02-17 MED ORDER — SODIUM CHLORIDE 0.9 % IJ SOLN
INTRAMUSCULAR | Status: DC | PRN
  Administered 2018-02-17: 60 mL

## 2018-02-17 SURGICAL SUPPLY — 57 items
ATTUNE PS FEM LT SZ 4 CEM KNEE (Femur) ×2 IMPLANT
ATTUNE PSRP INSR SZ4 10 KNEE (Insert) ×1 IMPLANT
ATTUNE PSRP INSR SZ4 10MM KNEE (Insert) ×1 IMPLANT
BAG SPEC THK2 15X12 ZIP CLS (MISCELLANEOUS) ×1
BAG ZIPLOCK 12X15 (MISCELLANEOUS) ×3 IMPLANT
BANDAGE ACE 6X5 VEL STRL LF (GAUZE/BANDAGES/DRESSINGS) ×3 IMPLANT
BASE TIBIAL ROT PLAT SZ 3 KNEE (Knees) IMPLANT
BLADE SAG 18X100X1.27 (BLADE) ×3 IMPLANT
BLADE SAW SGTL 11.0X1.19X90.0M (BLADE) ×3 IMPLANT
BOWL SMART MIX CTS (DISPOSABLE) ×3 IMPLANT
BSPLAT TIB 3 CMNT ROT PLAT STR (Knees) ×1 IMPLANT
CEMENT HV SMART SET (Cement) ×6 IMPLANT
CLOSURE WOUND 1/2 X4 (GAUZE/BANDAGES/DRESSINGS) ×1
COVER SURGICAL LIGHT HANDLE (MISCELLANEOUS) ×3 IMPLANT
CUFF TOURN SGL QUICK 34 (TOURNIQUET CUFF) ×3
CUFF TRNQT CYL 34X4X40X1 (TOURNIQUET CUFF) ×1 IMPLANT
DECANTER SPIKE VIAL GLASS SM (MISCELLANEOUS) ×3 IMPLANT
DRAPE U-SHAPE 47X51 STRL (DRAPES) ×3 IMPLANT
DRSG ADAPTIC 3X8 NADH LF (GAUZE/BANDAGES/DRESSINGS) ×3 IMPLANT
DRSG PAD ABDOMINAL 8X10 ST (GAUZE/BANDAGES/DRESSINGS) ×3 IMPLANT
DURAPREP 26ML APPLICATOR (WOUND CARE) ×3 IMPLANT
ELECT REM PT RETURN 15FT ADLT (MISCELLANEOUS) ×3 IMPLANT
EVACUATOR 1/8 PVC DRAIN (DRAIN) ×3 IMPLANT
GAUZE SPONGE 4X4 12PLY STRL (GAUZE/BANDAGES/DRESSINGS) ×3 IMPLANT
GLOVE BIOGEL PI IND STRL 6.5 (GLOVE) ×1 IMPLANT
GLOVE BIOGEL PI IND STRL 7.0 (GLOVE) ×1 IMPLANT
GLOVE BIOGEL PI IND STRL 8 (GLOVE) ×1 IMPLANT
GLOVE BIOGEL PI INDICATOR 6.5 (GLOVE) ×2
GLOVE BIOGEL PI INDICATOR 7.0 (GLOVE) ×2
GLOVE BIOGEL PI INDICATOR 8 (GLOVE) ×4
GLOVE SURG SS PI 6.5 STRL IVOR (GLOVE) ×13 IMPLANT
GOWN STRL REUS W/TWL LRG LVL3 (GOWN DISPOSABLE) ×6 IMPLANT
GOWN STRL REUS W/TWL XL LVL3 (GOWN DISPOSABLE) ×3 IMPLANT
HANDPIECE INTERPULSE COAX TIP (DISPOSABLE) ×3
HOLDER FOLEY CATH W/STRAP (MISCELLANEOUS) IMPLANT
IMMOBILIZER KNEE 20 (SOFTGOODS) ×3
IMMOBILIZER KNEE 20 THIGH 36 (SOFTGOODS) ×1 IMPLANT
MANIFOLD NEPTUNE II (INSTRUMENTS) ×3 IMPLANT
PACK TOTAL KNEE CUSTOM (KITS) ×3 IMPLANT
PAD ABD 8X10 STRL (GAUZE/BANDAGES/DRESSINGS) ×2 IMPLANT
PADDING CAST COTTON 6X4 STRL (CAST SUPPLIES) ×5 IMPLANT
PATELLA MEDIAL ATTUN 35MM KNEE (Knees) ×2 IMPLANT
PIN STEINMAN FIXATION KNEE (PIN) ×2 IMPLANT
PIN THREADED HEADED SIGMA (PIN) ×2 IMPLANT
POSITIONER SURGICAL ARM (MISCELLANEOUS) ×3 IMPLANT
SET HNDPC FAN SPRY TIP SCT (DISPOSABLE) ×1 IMPLANT
STRIP CLOSURE SKIN 1/2X4 (GAUZE/BANDAGES/DRESSINGS) ×3 IMPLANT
STRYKER OCILLATING SAW BLADE ×2 IMPLANT
SUT MNCRL AB 4-0 PS2 18 (SUTURE) ×3 IMPLANT
SUT STRATAFIX 0 PDS 27 VIOLET (SUTURE) ×3
SUT VIC AB 2-0 CT1 27 (SUTURE) ×9
SUT VIC AB 2-0 CT1 TAPERPNT 27 (SUTURE) ×3 IMPLANT
SUTURE STRATFX 0 PDS 27 VIOLET (SUTURE) ×1 IMPLANT
TIBIAL BASE ROT PLAT SZ 3 KNEE (Knees) ×3 IMPLANT
TRAY FOLEY BAG SILVER LF 16FR (CATHETERS) ×2 IMPLANT
WRAP KNEE MAXI GEL POST OP (GAUZE/BANDAGES/DRESSINGS) ×3 IMPLANT
YANKAUER SUCT BULB TIP 10FT TU (MISCELLANEOUS) ×3 IMPLANT

## 2018-02-17 NOTE — Op Note (Signed)
OPERATIVE REPORT-TOTAL KNEE ARTHROPLASTY   Pre-operative diagnosis- Osteoarthritis  Left knee(s)  Post-operative diagnosis- Osteoarthritis Left knee(s)  Procedure-  Left  Total Knee Arthroplasty (Depuy Attune)  Surgeon- Dione Plover. Rogena Deupree, MD  Assistant- Ardeen Jourdain, PA-C   Anesthesia-  Adductor canal block and spinal  EBL-50 mL   Drains Hemovac  Tourniquet time- 34 minutes @ 259 mm Hg  Complications- None  Condition-PACU - hemodynamically stable.   Brief Clinical Note  Desiree Pittman is a 67 y.o. year old female with end stage OA of her left knee with progressively worsening pain and dysfunction. She has constant pain, with activity and at rest and significant functional deficits with difficulties even with ADLs. She has had extensive non-op management including analgesics, injections of cortisone and viscosupplements, and home exercise program, but remains in significant pain with significant dysfunction. Radiographs show bone on bone arthritis medial and patellofemoral. She presents now for left Total Knee Arthroplasty.    Procedure in detail---   The patient is brought into the operating room and positioned supine on the operating table. After successful administration of  Adductor canal block and spinal,   a tourniquet is placed high on the  Left thigh(s) and the lower extremity is prepped and draped in the usual sterile fashion. Time out is performed by the operating team and then the  Left lower extremity is wrapped in Esmarch, knee flexed and the tourniquet inflated to 300 mmHg.       A midline incision is made with a ten blade through the subcutaneous tissue to the level of the extensor mechanism. A fresh blade is used to make a medial parapatellar arthrotomy. Soft tissue over the proximal medial tibia is subperiosteally elevated to the joint line with a knife and into the semimembranosus bursa with a Cobb elevator. Soft tissue over the proximal lateral tibia is  elevated with attention being paid to avoiding the patellar tendon on the tibial tubercle. The patella is everted, knee flexed 90 degrees and the ACL and PCL are removed. Findings are bone on bone medial and patellofemoral with large global osteophytes.        The drill is used to create a starting hole in the distal femur and the canal is thoroughly irrigated with sterile saline to remove the fatty contents. The 5 degree Left  valgus alignment guide is placed into the femoral canal and the distal femoral cutting block is pinned to remove 9 mm off the distal femur. Resection is made with an oscillating saw.      The tibia is subluxed forward and the menisci are removed. The extramedullary alignment guide is placed referencing proximally at the medial aspect of the tibial tubercle and distally along the second metatarsal axis and tibial crest. The block is pinned to remove 33mm off the more deficient medial  side. Resection is made with an oscillating saw. Size 3is the most appropriate size for the tibia and the proximal tibia is prepared with the modular drill and keel punch for that size.      The femoral sizing guide is placed and size 4 is most appropriate. Rotation is marked off the epicondylar axis and confirmed by creating a rectangular flexion gap at 90 degrees. The size 4 cutting block is pinned in this rotation and the anterior, posterior and chamfer cuts are made with the oscillating saw. The intercondylar block is then placed and that cut is made.      Trial size 3 tibial component, trial size 4  posterior stabilized femur and a 10  mm posterior stabilized rotating platform insert trial is placed. Full extension is achieved with excellent varus/valgus and anterior/posterior balance throughout full range of motion. The patella is everted and thickness measured to be 22  mm. Free hand resection is taken to 12 mm, a 35 template is placed, lug holes are drilled, trial patella is placed, and it tracks  normally. Osteophytes are removed off the posterior femur with the trial in place. All trials are removed and the cut bone surfaces prepared with pulsatile lavage. Cement is mixed and once ready for implantation, the size 3 tibial implant, size  4 posterior stabilized femoral component, and the size 35 patella are cemented in place and the patella is held with the clamp. The trial insert is placed and the knee held in full extension. The Exparel (20 ml mixed with 60 ml saline) is injected into the extensor mechanism, posterior capsule, medial and lateral gutters and subcutaneous tissues.  All extruded cement is removed and once the cement is hard the permanent 10 mm posterior stabilized rotating platform insert is placed into the tibial tray.      The wound is copiously irrigated with saline solution and the extensor mechanism closed over a hemovac drain with #1 V-loc suture. The tourniquet is released for a total tourniquet time of 34  minutes. Flexion against gravity is 140 degrees and the patella tracks normally. Subcutaneous tissue is closed with 2.0 vicryl and subcuticular with running 4.0 Monocryl. The incision is cleaned and dried and steri-strips and a bulky sterile dressing are applied. The limb is placed into a knee immobilizer and the patient is awakened and transported to recovery in stable condition.      Please note that a surgical assistant was a medical necessity for this procedure in order to perform it in a safe and expeditious manner. Surgical assistant was necessary to retract the ligaments and vital neurovascular structures to prevent injury to them and also necessary for proper positioning of the limb to allow for anatomic placement of the prosthesis.   Dione Plover Traniya Prichett, MD    02/17/2018, 2:09 PM

## 2018-02-17 NOTE — Discharge Instructions (Signed)

## 2018-02-17 NOTE — Care Plan (Signed)
L TKA scheduled on 02-17-18 DCP:  Home with dtr.  1 story home with 0 ste. DME:  No needs.  Has a RW and 3-in-1 PT:  Virtual PT.

## 2018-02-17 NOTE — Care Management Note (Signed)
Case Management Note  Patient Details  Name: Desiree Pittman MRN: [id number] Date of Birth: [birth year]  Subjective/Objective:   Spoke with patient at bedside. Confirmed plan for Virtual  PT, already arranged. Has RW and 3n1. [phone]                 Action/Plan:   Expected Discharge Date:                  Expected Discharge Plan:  Home/Self Care  In-House Referral:  NA  Discharge planning Services  NA  Post Acute Care Choice:  NA Choice offered to:  Patient  DME Arranged:  N/A DME Agency:  NA  HH Arranged:  NA(Virtual PT) [place] Agency:  NA  Status of Service:  Completed, signed off  If discussed at [place] of Stay Meetings, dates discussed:    Additional Comments:  Guadalupe Maple, RN 02/17/2018, 4:19 PM

## 2018-02-17 NOTE — Anesthesia Procedure Notes (Signed)
Anesthesia Regional Block: Adductor canal block   Pre-Anesthetic Checklist: ,, timeout performed, Correct Patient, Correct Site, Correct Laterality, Correct Procedure, Correct Position, site marked, Risks and benefits discussed,  Surgical consent,  Pre-op evaluation,  At surgeon's request and post-op pain management  Laterality: Left  Prep: chloraprep       Needles:  Injection technique: Single-shot  Needle Type: Echogenic Stimulator Needle     Needle Length: 9cm  Needle Gauge: 21     Additional Needles:   Procedures:,,,, ultrasound used (permanent image in chart),,,,  Narrative:  Start time: 02/17/2018 12:30 PM End time: 02/17/2018 12:35 PM Injection made incrementally with aspirations every 5 mL.  Performed by: Personally  Anesthesiologist: Effie Berkshire, MD  Additional Notes: Patient tolerated the procedure well. Local anesthetic introduced in an incremental fashion under minimal resistance after negative aspirations. No paresthesias were elicited. After completion of the procedure, no acute issues were identified and patient continued to be monitored by RN.

## 2018-02-17 NOTE — Transfer of Care (Signed)
Immediate Anesthesia Transfer of Care Note  Patient: Desiree Pittman  Procedure(s) Performed: LEFT TOTAL KNEE ARTHROPLASTY (Left Knee)  Patient Location: PACU  Anesthesia Type:Spinal  Level of Consciousness: awake, alert , oriented and patient cooperative  Airway & Oxygen Therapy: Patient Spontanous Breathing and Patient connected to face mask oxygen  Post-op Assessment: Report given to RN and Post -op Vital signs reviewed and stable  Post vital signs: Reviewed and stable  Last Vitals:  Vitals Value Taken Time  BP 122/73 02/17/2018  2:26 PM  Temp    Pulse 70 02/17/2018  2:28 PM  Resp 20 02/17/2018  2:28 PM  SpO2 100 % 02/17/2018  2:28 PM  Vitals shown include unvalidated device data.  Last Pain:  Vitals:   02/17/18 1000  TempSrc: Oral         Complications: No apparent anesthesia complications

## 2018-02-17 NOTE — Progress Notes (Signed)
Assisted Dr. Hollis with left, ultrasound guided, adductor canal block. Side rails up, monitors on throughout procedure. See vital signs in flow sheet. Tolerated Procedure well.  

## 2018-02-17 NOTE — Anesthesia Preprocedure Evaluation (Addendum)
Anesthesia Evaluation  Patient identified by MRN, date of birth, ID band Patient awake    Reviewed: Allergy & Precautions, NPO status , Patient's Chart, lab work & pertinent test results  History of Anesthesia Complications (+) PONV  Airway Mallampati: I  TM Distance: >3 FB Neck ROM: Full    Dental  (+) Teeth Intact, Dental Advisory Given   Pulmonary neg pulmonary ROS,    breath sounds clear to auscultation       Cardiovascular hypertension, Pt. on medications  Rhythm:Regular Rate:Normal     Neuro/Psych Anxiety Depression negative neurological ROS     GI/Hepatic Neg liver ROS, GERD  ,  Endo/Other  diabetes, Type 2  Renal/GU negative Renal ROS     Musculoskeletal  (+) Arthritis , Osteoarthritis,    Abdominal Normal abdominal exam  (+)   Peds  Hematology   Anesthesia Other Findings - HLD  Reproductive/Obstetrics                            Lab Results  Component Value Date   WBC 4.6 02/12/2018   HGB 14.5 02/12/2018   HCT 43.6 02/12/2018   MCV 98.4 02/12/2018   PLT 218 02/12/2018   Lab Results  Component Value Date   CREATININE 0.60 02/12/2018   BUN 14 02/12/2018   NA 144 02/12/2018   K 4.7 02/12/2018   CL 108 02/12/2018   CO2 28 02/12/2018   Lab Results  Component Value Date   INR 0.85 02/12/2018   INR 0.87 04/14/2012   EKG: sinus bradycardia.  Anesthesia Physical Anesthesia Plan  ASA: II  Anesthesia Plan: Spinal   Post-op Pain Management:  Regional for Post-op pain   Induction: Intravenous  PONV Risk Score and Plan: 3 and Ondansetron, Propofol infusion and Dexamethasone  Airway Management Planned: Natural Airway and Simple Face Mask  Additional Equipment: None  Intra-op Plan:   Post-operative Plan:   Informed Consent: I have reviewed the patients History and Physical, chart, labs and discussed the procedure including the risks, benefits and alternatives for  the proposed anesthesia with the patient or authorized representative who has indicated his/her understanding and acceptance.     Plan Discussed with: CRNA  Anesthesia Plan Comments:        Anesthesia Quick Evaluation

## 2018-02-17 NOTE — Interval H&P Note (Signed)
History and Physical Interval Note:  02/17/2018 10:46 AM  Desiree Pittman  has presented today for surgery, with the diagnosis of Left knee osteoarthritis  The various methods of treatment have been discussed with the patient and family. After consideration of risks, benefits and other options for treatment, the patient has consented to  Procedure(s): LEFT TOTAL KNEE ARTHROPLASTY (Left) as a surgical intervention .  The patient's history has been reviewed, patient examined, no change in status, stable for surgery.  I have reviewed the patient's chart and labs.  Questions were answered to the patient's satisfaction.     Pilar Plate Chan Rosasco

## 2018-02-17 NOTE — Anesthesia Postprocedure Evaluation (Signed)
Anesthesia Post Note  Patient: Tram Wrenn  Procedure(s) Performed: LEFT TOTAL KNEE ARTHROPLASTY (Left Knee)     Patient location during evaluation: PACU Anesthesia Type: Spinal Level of consciousness: oriented and awake and alert Pain management: pain level controlled Vital Signs Assessment: post-procedure vital signs reviewed and stable Respiratory status: spontaneous breathing, respiratory function stable and patient connected to nasal cannula oxygen Cardiovascular status: blood pressure returned to baseline and stable Postop Assessment: no headache, no backache, no apparent nausea or vomiting, spinal receding and patient able to bend at knees Anesthetic complications: no    Last Vitals:  Vitals:   02/17/18 1500 02/17/18 1613  BP: 130/73 129/84  Pulse: 65 68  Resp: 14   Temp: 36.6 C (!) 36.4 C  SpO2: 100% 100%    Last Pain:  Vitals:   02/17/18 1613  TempSrc: Oral  PainSc:                  Effie Berkshire

## 2018-02-17 NOTE — Anesthesia Procedure Notes (Signed)
Spinal  Patient location during procedure: OR Start time: 02/17/2018 12:55 PM End time: 02/17/2018 1:01 PM Staffing Resident/CRNA: Armistead, Lacey A, CRNA Preanesthetic Checklist Completed: patient identified, site marked, surgical consent, pre-op evaluation, timeout performed, IV checked, risks and benefits discussed and monitors and equipment checked Spinal Block Patient position: sitting Prep: site prepped and draped and DuraPrep Patient monitoring: heart rate, continuous pulse ox and blood pressure Approach: midline Location: L3-4 Injection technique: single-shot Needle Needle type: Pencan  Needle gauge: 24 G Needle length: 10 cm Assessment Sensory level: T4 Additional Notes Pt placed in sitting position. Spinal kit expiration date checked and verified. +CSF, - heme. Pt tolerated well.      

## 2018-02-18 ENCOUNTER — Encounter (HOSPITAL_COMMUNITY): Payer: Self-pay | Admitting: Orthopedic Surgery

## 2018-02-18 LAB — BASIC METABOLIC PANEL
Anion gap: 9 (ref 5–15)
BUN: 12 mg/dL (ref 8–23)
CO2: 27 mmol/L (ref 22–32)
Calcium: 9.4 mg/dL (ref 8.9–10.3)
Chloride: 104 mmol/L (ref 98–111)
Creatinine, Ser: 0.61 mg/dL (ref 0.44–1.00)
GFR calc Af Amer: >60 mL/min (ref >60)
GFR calc non Af Amer: >60 mL/min (ref >60)
Glucose, Bld: 120 mg/dL — ABNORMAL HIGH (ref 70–99)
Potassium: 4.6 mmol/L (ref 3.5–5.1)
Sodium: 140 mmol/L (ref 135–145)

## 2018-02-18 LAB — CBC
HCT: 40.1 % (ref 36.0–46.0)
HEMOGLOBIN: 13.1 g/dL (ref 12.0–15.0)
MCH: 32.3 pg (ref 26.0–34.0)
MCHC: 32.7 g/dL (ref 30.0–36.0)
MCV: 98.8 fL (ref 80.0–100.0)
Platelets: 244 10*3/uL (ref 150–400)
RBC: 4.06 MIL/uL (ref 3.87–5.11)
RDW: 13.5 % (ref 11.5–15.5)
WBC: 10.3 10*3/uL (ref 4.0–10.5)

## 2018-02-18 NOTE — Progress Notes (Signed)
   Subjective: 1 Day Post-Op Procedure(s) (LRB): LEFT TOTAL KNEE ARTHROPLASTY (Left) Patient reports pain as mild.   Patient seen in rounds by Dr. Wynelle Link. Patient is well, and has had no acute complaints or problems other than discomfort in the left knee. No issues overnight. Foley catheter removed this AM. Denies chest pain, SOB, or calf pain.  We will start therapy today.   Objective: Vital signs in last 24 hours: Temp:  [97.5 F (36.4 C)-98.4 F (36.9 C)] 98.4 F (36.9 C) (10/08 0511) Pulse Rate:  [51-72] 55 (10/08 0511) Resp:  [10-23] 17 (10/08 0511) BP: (121-149)/(63-108) 126/63 (10/08 0511) SpO2:  [98 %-100 %] 98 % (10/08 0511) Weight:  [55.4 kg] 55.4 kg (10/07 1021)  Intake/Output from previous day:  Intake/Output Summary (Last 24 hours) at 02/18/2018 0733 Last data filed at 02/18/2018 0600 Gross per 24 hour  Intake 4044.33 ml  Output 2365 ml  Net 1679.33 ml    Labs: Recent Labs    02/18/18 0458  HGB 13.1   Recent Labs    02/18/18 0458  WBC 10.3  RBC 4.06  HCT 40.1  PLT 244   Recent Labs    02/18/18 0458  NA 140  K 4.6  CL 104  CO2 27  BUN 12  CREATININE 0.61  GLUCOSE 120*  CALCIUM 9.4   Exam: General - Patient is Alert and Oriented Extremity - Neurologically intact Neurovascular intact Sensation intact distally Dorsiflexion/Plantar flexion intact Dressing - dressing C/D/I Motor Function - intact, moving foot and toes well on exam.   Past Medical History:  Diagnosis Date  . Abnormal Pap smear of cervix   . Allergy   . Anxiety   . Arthritis   . Blood in stool    colonoscopy 02/2017  . Chicken pox   . Colon polyps   . Depression   . Diabetes mellitus without complication ([place])    type 2  . Diverticulitis   . GERD (gastroesophageal reflux disease)    h/o ulcers ? GI   . Glaucoma    borderline   . Heart murmur   . Hyperlipidemia   . Hypertension   . PONV (postoperative nausea and vomiting)    pull tubes out,etc.     Assessment/Plan: 1 Day Post-Op Procedure(s) (LRB): LEFT TOTAL KNEE ARTHROPLASTY (Left) Active Problems:   OA (osteoarthritis) of knee  Estimated body mass index is 22.35 kg/m as calculated from the following:   Height as of this encounter: 5\' 2"  (1.575 m).   Weight as of this encounter: 55.4 kg. Advance diet Up with therapy  Anticipated LOS equal to or greater than 2 midnights due to - Age 70 and older with one or more of the following:  - Obesity  - Expected need for hospital services (PT, OT, Nursing) required for safe  discharge  - Anticipated need for postoperative skilled nursing care or inpatient rehab  - Active co-morbidities: Diabetes OR   - Unanticipated findings during/Post Surgery: None  - Patient is a high risk of re-admission due to: None    DVT Prophylaxis - Aspirin Weight bearing as tolerated. D/C O2 and pulse ox and try on room air. Hemovac pulled without difficulty, will begin therapy today.  Plan is to go Home after hospital stay. Plan for discharge tomorrow.  Theresa Duty, PA-C Orthopedic Surgery 02/18/2018, 7:33 AM

## 2018-02-18 NOTE — Evaluation (Signed)
Physical Therapy Evaluation Patient Details Name: Desiree Pittman MRN: [id number] DOB: [birth year] Today's Date: 02/18/2018   History of Present Illness  Desiree Pittman presents with dx of Left knee osteoarthritis. Pt had L TKR on 02/17/18.   Clinical Impression  Pt presents with a L TKR. Pt demonstrates decreased ability to amb, balance and perform functional activities due to pain, swelling and decreased ROM. Pt required min assist and verbal and tactile cues to amb safely in the walker. Pt reports use of virtual PT at home and would like to use that upon d/c. Pt will benefit from skilled PT while in the acute care setting.     Follow Up Recommendations Follow surgeon's recommendation for DC plan and follow-up therapies;Supervision for mobility/OOB    Equipment Recommendations  None recommended by PT(Pt reports having a walker at home.)    Recommendations for Other Services       Precautions / Restrictions Precautions Precautions: Fall Required Braces or Orthoses: Knee Immobilizer - Left Restrictions Weight Bearing Restrictions: No LLE Weight Bearing: Weight bearing as tolerated Other Position/Activity Restrictions: Pt required assist to move through full  L LE SLR       Mobility  Bed Mobility Overal bed mobility: Needs Assistance Bed Mobility: Supine to Sit     Supine to sit: Min assist     General bed mobility comments: Pt required min assist to lower L LE due to pain.  Transfers Overall transfer level: Needs assistance Equipment used: Rolling walker (2 wheeled) Transfers: Sit to/from Stand Sit to Stand: Min guard         General transfer comment: Pt required min guard for stability and safety.  Ambulation/Gait Ambulation/Gait assistance: Min guard Gait Distance (Feet): 100 Feet Assistive device: Rolling walker (2 wheeled) Gait Pattern/deviations: Step-to pattern;Step-through pattern;Decreased stride length     General Gait Details: Pt required min assist  for saftey and stability. Pt required verbal and tactile cues to stay within the bars of the walker.  Stairs            Wheelchair Mobility    Modified Rankin (Stroke Patients Only)       Balance Overall balance assessment: Needs assistance Sitting-balance support: No upper extremity supported Sitting balance-Leahy Scale: Fair Sitting balance - Comments: Pt was able to move from sitting to stand with min assist for safety.   Standing balance support: Bilateral upper extremity supported Standing balance-Leahy Scale: Poor Standing balance comment: Pt required min assist and support from the walker due to weakness and pain.                             Pertinent Vitals/Pain Pain Assessment: 0-10 Pain Score: 5  Pain Location: L Knee Pain Descriptors / Indicators: Dull;Aching Pain Intervention(s): Monitored during session;RN gave pain meds during session;Ice applied    Home Living Family/patient expects to be discharged to:: Private residence Living Arrangements: Spouse/significant other Available Help at Discharge: Family(husband, daughter, grandson) Type of Home: House Home Access: Ramped entrance     Home Layout: One level Home Equipment: Shower seat;Wheelchair - [employer] - 2 wheels;Cane - single point Additional Comments: Pt reports house is handicapped accessible.    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     Hand Dominance  Extremity/Trunk Assessment   Upper Extremity Assessment Upper Extremity Assessment: Overall WFL for tasks assessed    Lower Extremity Assessment Lower Extremity Assessment: Generalized weakness;LLE deficits/detail LLE Deficits / Details: L knee was swollen and painful. Knee flexion ROM approx 66 degrees, knee ext ROM approx 5 degrees from 0. LLE: Unable to fully assess  due to pain       Communication   Communication: No difficulties  Cognition Arousal/Alertness: Awake/alert Behavior During Therapy: WFL for tasks assessed/performed Overall Cognitive Status: Within Functional Limits for tasks assessed                                        General Comments General comments (skin integrity, edema, etc.): Pt reported her pain was 4/10 with walking and that walking helped her feel "better".    Exercises Total Joint Exercises Ankle Circles/Pumps: AROM;Both;10 reps;Supine Quad Sets: AROM;Both;10 reps;Supine Heel Slides: AROM;Both;10 reps;Supine Goniometric ROM: L knee flex 66 degrees, ext 5 degrees from 0.   Assessment/Plan    PT Assessment Patient needs continued PT services  PT Problem List Decreased strength;Decreased mobility;Decreased range of motion;Decreased balance;Decreased activity tolerance;Pain       PT Treatment Interventions DME instruction;Therapeutic activities;Modalities;Gait training;Therapeutic exercise;Patient/family education;Balance training    PT Goals (Current goals can be found in the Care Plan section)  Acute Rehab PT Goals Patient Stated Goal: Pt would like to return home and decrease pain. PT Goal Formulation: With patient Time For Goal Achievement: 03/04/18 Potential to Achieve Goals: Good    Frequency 7X/week   Barriers to discharge        Co-evaluation               AM-PAC PT "6 Clicks" Daily Activity  Outcome Measure Difficulty turning over in bed (including adjusting bedclothes, sheets and blankets)?: Unable Difficulty moving from lying on back to sitting on the side of the bed? : Unable Difficulty sitting down on and standing up from a chair with arms (e.g., wheelchair, bedside commode, etc,.)?: A Lot Help needed moving to and from a bed to chair (including a wheelchair)?: A Lot Help needed walking in hospital room?: A Lot Help needed climbing 3-5 steps with a railing? : Total 6  Click Score: 9    End of Session Equipment Utilized During Treatment: Gait belt;Left knee immobilizer Activity Tolerance: Patient tolerated treatment well Patient left: in chair;with call bell/phone within reach   PT Visit Diagnosis: Difficulty in walking, not elsewhere classified (R26.2)    Time: 2130-8657 PT Time Calculation (min) (ACUTE ONLY): 40 min   Charges:   PT Evaluation $PT Eval Low Complexity: 1 Low PT Treatments $Gait Training: 8-22 mins $Therapeutic Exercise: 8-22 mins        [employer] SPT 02/18/2018   Rolland Porter 02/18/2018, 2:03 PM

## 2018-02-18 NOTE — Progress Notes (Signed)
Physical Therapy Treatment Patient Details Name: Desiree Pittman MRN: [id number] DOB: [birth year] Today's Date: 02/18/2018    History of Present Illness Desiree Pittman presents with dx of Left knee osteoarthritis. Pt had L TKR on 02/17/18.     PT Comments    Pt presents with L TKR. Pt has decreased amb and balance due to pain, swelling and decreased ROM. Pt amb down the hall with min assist for safety and stability and reported that her leg felt better with movement. Pt is progressing toward her goals and will benefit from skilled PT while in the acute care setting.   Follow Up Recommendations  Follow surgeon's recommendation for DC plan and follow-up therapies;Supervision for mobility/OOB     Equipment Recommendations  None recommended by PT    Recommendations for Other Services       Precautions / Restrictions Precautions Precautions: Fall Required Braces or Orthoses: Knee Immobilizer - Left Restrictions Weight Bearing Restrictions: No LLE Weight Bearing: Weight bearing as tolerated    Mobility  Bed Mobility Overal bed mobility: Needs Assistance Bed Mobility: Supine to Sit     Supine to sit: Min assist     General bed mobility comments: Pt required min assist for safety and stability   Transfers Overall transfer level: Needs assistance Equipment used: Rolling walker (2 wheeled) Transfers: Sit to/from Stand Sit to Stand: Min guard         General transfer comment: Pt required min guard for stability and safety.  Ambulation/Gait Ambulation/Gait assistance: Min guard Gait Distance (Feet): 250 Feet Assistive device: Rolling walker (2 wheeled) Gait Pattern/deviations: Step-to pattern;Step-through pattern;Decreased stride length     General Gait Details: Pt required min assist for safety and stability. Pt required verbal cues to stay within the bars of her walker.   Stairs             Wheelchair Mobility    Modified Rankin (Stroke Patients Only)        Balance Overall balance assessment: Needs assistance Sitting-balance support: No upper extremity supported Sitting balance-Leahy Scale: Good Sitting balance - Comments: Pt was able to move from sitting to stand with min assist for safety.   Standing balance support: Bilateral upper extremity supported Standing balance-Leahy Scale: Poor Standing balance comment: Pt required min assist and support from the walker due to weakness and pain.                            Cognition Arousal/Alertness: Awake/alert Behavior During Therapy: WFL for tasks assessed/performed Overall Cognitive Status: Within Functional Limits for tasks assessed                                        Exercises Total Joint Exercises Short Arc Quad: AROM;Both;10 reps;Supine Heel Slides: AROM;Both;10 reps;Seated Hip ABduction/ADduction: AROM;Both;10 reps;Supine     General Comments General comments (skin integrity, edema, etc.): Pt reported her pain was 2/10 with walking and that walking helped her feel better.       Pertinent Vitals/Pain Pain Assessment: 0-10 Pain Score: 2  Pain Location: Pt reports pain in L Knee and in upper thigh. Pain Descriptors / Indicators: Aching;Dull Pain Intervention(s): Monitored during session;Ice applied    Home Living                      Prior Function  PT Goals (current goals can now be found in the care plan section) Acute Rehab PT Goals Potential to Achieve Goals: Good Progress towards PT goals: Progressing toward goals    Frequency    7X/week      PT Plan      Co-evaluation              AM-PAC PT "6 Clicks" Daily Activity  Outcome Measure  Difficulty turning over in bed (including adjusting bedclothes, sheets and blankets)?: A Lot Difficulty moving from lying on back to sitting on the side of the bed? : A Lot Difficulty sitting down on and standing up from a chair with arms (e.g., wheelchair,  bedside commode, etc,.)?: A Lot Help needed moving to and from a bed to chair (including a wheelchair)?: A Lot Help needed walking in hospital room?: A Little Help needed climbing 3-5 steps with a railing? : A Lot 6 Click Score: 13    End of Session Equipment Utilized During Treatment: Gait belt;Left knee immobilizer Activity Tolerance: Patient tolerated treatment well Patient left: in bed;with call bell/phone within reach   PT Visit Diagnosis: Difficulty in walking, not elsewhere classified (R26.2)     Time: 1530-1600 PT Time Calculation (min) (ACUTE ONLY): 30 min  Charges:  $Gait Training: 8-22 mins $Therapeutic Exercise: 8-22 mins                     [employer] SPT 02/18/2018    Rolland Porter 02/18/2018, 5:00 PM

## 2018-02-19 LAB — BASIC METABOLIC PANEL
Anion gap: 10 (ref 5–15)
BUN: 7 mg/dL — AB (ref 8–23)
CHLORIDE: 96 mmol/L — AB (ref 98–111)
CO2: 27 mmol/L (ref 22–32)
CREATININE: 0.53 mg/dL (ref 0.44–1.00)
Calcium: 9.8 mg/dL (ref 8.9–10.3)
Glucose, Bld: 130 mg/dL — ABNORMAL HIGH (ref 70–99)
Potassium: 3.9 mmol/L (ref 3.5–5.1)
SODIUM: 133 mmol/L — AB (ref 135–145)

## 2018-02-19 LAB — CBC
HEMATOCRIT: 36.3 % (ref 36.0–46.0)
HEMOGLOBIN: 12 g/dL (ref 12.0–15.0)
MCH: 32.1 pg (ref 26.0–34.0)
MCHC: 33.1 g/dL (ref 30.0–36.0)
MCV: 97.1 fL (ref 80.0–100.0)
NRBC: 0 % (ref 0.0–0.2)
Platelets: 186 10*3/uL (ref 150–400)
RBC: 3.74 MIL/uL — AB (ref 3.87–5.11)
RDW: 13.1 % (ref 11.5–15.5)
WBC: 9.5 10*3/uL (ref 4.0–10.5)

## 2018-02-19 MED ORDER — OXYCODONE HCL 5 MG PO TABS: 5.0000 mg | ORAL_TABLET | Freq: Four times a day (QID) | ORAL | 0 refills | Status: DC | PRN

## 2018-02-19 MED ORDER — ASPIRIN 325 MG PO TBEC: 325.0000 mg | DELAYED_RELEASE_TABLET | Freq: Two times a day (BID) | ORAL | 0 refills | Status: AC

## 2018-02-19 MED ORDER — METHOCARBAMOL 500 MG PO TABS: 500.0000 mg | ORAL_TABLET | Freq: Four times a day (QID) | ORAL | 0 refills | Status: DC | PRN

## 2018-02-19 MED ORDER — GABAPENTIN 300 MG PO CAPS: 300.0000 mg | ORAL_CAPSULE | Freq: Three times a day (TID) | ORAL | 0 refills | Status: DC

## 2018-02-19 NOTE — Progress Notes (Signed)
Discharge teaching completed with teach back. Discharge instructions given and reviewed with pt. Prescriptions given for Asprin EC, Oxycodone, Neurontin, Robaxin. Pt. to follow up with Dr. Maureen Ralphs on 03/04/18. Gauze and tape provided for discharge.

## 2018-02-19 NOTE — Progress Notes (Signed)
Physical Therapy Treatment Patient Details Name: Desiree Pittman MRN: [id number] DOB: [birth year] Today's Date: 02/19/2018    History of Present Illness Desiree Pittman presents with dx of Left knee osteoarthritis. Pt had L TKR on 02/17/18.     PT Comments    Progressing well with mobility. Reviewed exercises and gait training. Pt stated she did not have any stairs at home. All education completed. Okay to d/c from PT standpoint-made RN aware.    Follow Up Recommendations  Follow surgeon's recommendation for DC plan and follow-up therapies;Supervision for mobility/OOB     Equipment Recommendations  None recommended by PT    Recommendations for Other Services       Precautions / Restrictions Precautions Precautions: Fall Required Braces or Orthoses: Knee Immobilizer - Left Knee Immobilizer - Left: Discontinue once straight leg raise with < 10 degree lag Restrictions Weight Bearing Restrictions: No LLE Weight Bearing: Weight bearing as tolerated    Mobility  Bed Mobility Overal bed mobility: Needs Assistance Bed Mobility: Supine to Sit;Sit to Supine     Supine to sit: HOB elevated;Min guard Sit to supine: HOB elevated;Min guard   General bed mobility comments: Close guard for safety. Increased time. Pt used R foot to hook L LE to get back into bed.   Transfers Overall transfer level: Needs assistance Equipment used: Rolling walker (2 wheeled) Transfers: Sit to/from Stand Sit to Stand: Supervision         General transfer comment: for safety. VCs hand placement, safety  Ambulation/Gait Ambulation/Gait assistance: Min guard Gait Distance (Feet): 125 Feet Assistive device: Rolling walker (2 wheeled) Gait Pattern/deviations: Step-to pattern     General Gait Details: Close guard for safety. Fair gait speed. No increase in pain reported   Stairs             Wheelchair Mobility    Modified Rankin (Stroke Patients Only)       Balance                                            Cognition Arousal/Alertness: Awake/alert Behavior During Therapy: WFL for tasks assessed/performed Overall Cognitive Status: Within Functional Limits for tasks assessed                                        Exercises Total Joint Exercises Ankle Circles/Pumps: AROM;Both;10 reps;Supine Quad Sets: AROM;Both;10 reps;Supine Short Arc Quad: AROM;Both;10 reps;Supine Heel Slides: Both;10 reps;Seated;AAROM Hip ABduction/ADduction: Both;10 reps;Supine;AROM Straight Leg Raises: AAROM;Left;10 reps;Supine Goniometric ROM: ~10-65 degrees    General Comments        Pertinent Vitals/Pain Pain Assessment: 0-10 Pain Score: 4  Pain Location: L knee Pain Descriptors / Indicators: Aching;Discomfort Pain Intervention(s): Monitored during session;Repositioned;Ice applied    Home Living                      Prior Function            PT Goals (current goals can now be found in the care plan section) Progress towards PT goals: Progressing toward goals    Frequency    7X/week      PT Plan Current plan remains appropriate    Co-evaluation              AM-PAC PT "6 Clicks" Daily  Activity  Outcome Measure  Difficulty turning over in bed (including adjusting bedclothes, sheets and blankets)?: A Little Difficulty moving from lying on back to sitting on the side of the bed? : A Little Difficulty sitting down on and standing up from a chair with arms (e.g., wheelchair, bedside commode, etc,.)?: A Little Help needed moving to and from a bed to chair (including a wheelchair)?: A Little Help needed walking in hospital room?: A Little Help needed climbing 3-5 steps with a railing? : A Little 6 Click Score: 18    End of Session Equipment Utilized During Treatment: Gait belt;Left knee immobilizer Activity Tolerance: Patient tolerated treatment well Patient left: in bed;with call bell/phone within reach   PT Visit  Diagnosis: Difficulty in walking, not elsewhere classified (R26.2)     Time: 2549-8264 PT Time Calculation (min) (ACUTE ONLY): 17 min  Charges:  $Gait Training: 8-22 mins                         Desiree Pittman, [place] Pager: [phone] Office: [phone]

## 2018-02-19 NOTE — Progress Notes (Signed)
   Subjective: 2 Days Post-Op Procedure(s) (LRB): LEFT TOTAL KNEE ARTHROPLASTY (Left) Patient reports pain as mild.   Patient seen in rounds with Dr. Wynelle Link. Patient is well, and has had no acute complaints or problems. Had some issues with nausea yesterday, but this has since resolved. States she is ready to go home. Voiding without difficulty and positive flatus. Denies chest pain, SOB, or calf pain.  Plan is to go Home after hospital stay.  Objective: Vital signs in last 24 hours: Temp:  [97.9 F (36.6 C)-99 F (37.2 C)] 98.7 F (37.1 C) (10/09 0517) Pulse Rate:  [60-66] 66 (10/09 0517) Resp:  [16-18] 17 (10/08 2248) BP: (130-150)/(70-77) 146/70 (10/09 0517) SpO2:  [96 %-100 %] 96 % (10/09 0517)  Intake/Output from previous day:  Intake/Output Summary (Last 24 hours) at 02/19/2018 0835 Last data filed at 02/18/2018 2200 Gross per 24 hour  Intake 913.75 ml  Output 450 ml  Net 463.75 ml    Labs: Recent Labs    02/18/18 0458 02/19/18 0457  HGB 13.1 12.0   Recent Labs    02/18/18 0458 02/19/18 0457  WBC 10.3 9.5  RBC 4.06 3.74*  HCT 40.1 36.3  PLT 244 186   Recent Labs    02/18/18 0458 02/19/18 0457  NA 140 133*  K 4.6 3.9  CL 104 96*  CO2 27 27  BUN 12 7*  CREATININE 0.61 0.53  GLUCOSE 120* 130*  CALCIUM 9.4 9.8   Exam: General - Patient is Alert and Oriented Extremity - Neurologically intact Neurovascular intact Sensation intact distally Dorsiflexion/Plantar flexion intact Dressing/Incision - clean, dry, no drainage Motor Function - intact, moving foot and toes well on exam.   Past Medical History:  Diagnosis Date  . Abnormal Pap smear of cervix   . Allergy   . Anxiety   . Arthritis   . Blood in stool    colonoscopy 02/2017  . Chicken pox   . Colon polyps   . Depression   . Diabetes mellitus without complication ([place])    type 2  . Diverticulitis   . GERD (gastroesophageal reflux disease)    h/o ulcers ? GI   . Glaucoma    borderline    . Heart murmur   . Hyperlipidemia   . Hypertension   . PONV (postoperative nausea and vomiting)    pull tubes out,etc.    Assessment/Plan: 2 Days Post-Op Procedure(s) (LRB): LEFT TOTAL KNEE ARTHROPLASTY (Left) Active Problems:   OA (osteoarthritis) of knee  Estimated body mass index is 22.35 kg/m as calculated from the following:   Height as of this encounter: 5\' 2"  (1.575 m).   Weight as of this encounter: 55.4 kg. Up with therapy D/C IV fluids  DVT Prophylaxis - Aspirin Weight-bearing as tolerated  Plan for discharge to home after one session of therapy today. Scheduled for virtual physical therapy, f/u in the office in 2 weeks.   Theresa Duty, PA-C Orthopedic Surgery 02/19/2018, 8:35 AM

## 2018-02-19 NOTE — Plan of Care (Signed)

## 2018-02-24 NOTE — Discharge Summary (Signed)
Physician Discharge Summary   Patient ID: Desiree Pittman MRN: [id number] DOB/AGE: 12-31-1950 67 y.o.  Admit date: 02/17/2018 Discharge date: 02/19/2018  Primary Diagnosis: Osteoarthritis, left knee   Admission Diagnoses:  Past Medical History:  Diagnosis Date  . Abnormal Pap smear of cervix   . Allergy   . Anxiety   . Arthritis   . Blood in stool    colonoscopy 02/2017  . Chicken pox   . Colon polyps   . Depression   . Diabetes mellitus without complication ([place])    type 2  . Diverticulitis   . GERD (gastroesophageal reflux disease)    h/o ulcers ? GI   . Glaucoma    borderline   . Heart murmur   . Hyperlipidemia   . Hypertension   . PONV (postoperative nausea and vomiting)    pull tubes out,etc.   Discharge Diagnoses:   Active Problems:   OA (osteoarthritis) of knee  Estimated body mass index is 22.35 kg/m as calculated from the following:   Height as of this encounter: '5\' 2"'  (1.575 m).   Weight as of this encounter: 55.4 kg.  Procedure:  Procedure(s) (LRB): LEFT TOTAL KNEE ARTHROPLASTY (Left)   Consults: None  HPI: Desiree Pittman is a 67 y.o. year old female with end stage OA of her left knee with progressively worsening pain and dysfunction. She has constant pain, with activity and at rest and significant functional deficits with difficulties even with ADLs. She has had extensive non-op management including analgesics, injections of cortisone and viscosupplements, and home exercise program, but remains in significant pain with significant dysfunction. Radiographs show bone on bone arthritis medial and patellofemoral. She presents now for left Total Knee Arthroplasty.  Laboratory Data: Admission on 02/17/2018, Discharged on 02/19/2018  Component Date Value Ref Range Status  . Glucose-Capillary 02/17/2018 104* 70 - 99 mg/dL Final  . WBC 02/18/2018 10.3  4.0 - 10.5 K/uL Final  . RBC 02/18/2018 4.06  3.87 - 5.11 MIL/uL Final  . Hemoglobin 02/18/2018 13.1   12.0 - 15.0 g/dL Final  . HCT 02/18/2018 40.1  36.0 - 46.0 % Final  . MCV 02/18/2018 98.8  80.0 - 100.0 fL Final  . MCH 02/18/2018 32.3  26.0 - 34.0 pg Final  . MCHC 02/18/2018 32.7  30.0 - 36.0 g/dL Final  . RDW 02/18/2018 13.5  11.5 - 15.5 % Final  . Platelets 02/18/2018 244  150 - 400 K/uL Final   Performed at [facility], [place] [address] [place], [place] [address]  . Sodium 02/18/2018 140  135 - 145 mmol/L Final  . Potassium 02/18/2018 4.6  3.5 - 5.1 mmol/L Final  . Chloride 02/18/2018 104  98 - 111 mmol/L Final  . CO2 02/18/2018 27  22 - 32 mmol/L Final  . Glucose, Bld 02/18/2018 120* 70 - 99 mg/dL Final  . BUN 02/18/2018 12  8 - 23 mg/dL Final  . Creatinine, Ser 02/18/2018 0.61  0.44 - 1.00 mg/dL Final  . Calcium 02/18/2018 9.4  8.9 - 10.3 mg/dL Final  . GFR calc non Af Amer 02/18/2018 >60  >60 mL/min Final  . GFR calc Af Amer 02/18/2018 >60  >60 mL/min Final   Comment: (NOTE) The eGFR has been calculated using the CKD EPI equation. This calculation has not been validated in all clinical situations. eGFR's persistently <60 mL/min signify possible Chronic Kidney Disease.   Georgiann Hahn gap 02/18/2018 9  5 - 15 Final   Performed at [facility], [place] [address] [place], [place] [address]  . WBC 02/19/2018 9.5  4.0 - 10.5 K/uL Final  . RBC 02/19/2018 3.74* 3.87 - 5.11 MIL/uL Final  . Hemoglobin 02/19/2018 12.0  12.0 - 15.0 g/dL Final  . HCT 02/19/2018 36.3  36.0 - 46.0 % Final  . MCV 02/19/2018 97.1  80.0 - 100.0 fL Final  . MCH 02/19/2018 32.1  26.0 - 34.0 pg Final  . MCHC 02/19/2018 33.1  30.0 - 36.0 g/dL Final  . RDW 02/19/2018 13.1  11.5 - 15.5 % Final  . Platelets 02/19/2018 186  150 - 400 K/uL Final  . nRBC 02/19/2018 0.0  0.0 - 0.2 % Final   Performed at [facility], [place] [address], [place], [place] [address]  . Sodium 02/19/2018 133* 135 - 145 mmol/L Final   DELTA CHECK NOTED  . Potassium 02/19/2018 3.9  3.5 - 5.1  mmol/L Final  . Chloride 02/19/2018 96* 98 - 111 mmol/L Final  . CO2 02/19/2018 27  22 - 32 mmol/L Final  . Glucose, Bld 02/19/2018 130* 70 - 99 mg/dL Final  . BUN 02/19/2018 7* 8 - 23 mg/dL Final  . Creatinine, Ser 02/19/2018 0.53  0.44 - 1.00 mg/dL Final  . Calcium 02/19/2018 9.8  8.9 - 10.3 mg/dL Final  . GFR calc non Af Amer 02/19/2018 >60  >60 mL/min Final  . GFR calc Af Amer 02/19/2018 >60  >60 mL/min Final   Comment: (NOTE) The eGFR has been calculated using the CKD EPI equation. This calculation has not been validated in all clinical situations. eGFR's persistently <60 mL/min signify possible Chronic Kidney Disease.   Georgiann Hahn gap 02/19/2018 10  5 - 15 Final   Performed at [facility], [place] [address], [place], [place] [address]  Hospital Outpatient Visit on 02/12/2018  Component Date Value Ref Range Status  . MRSA, PCR 02/12/2018 NEGATIVE  NEGATIVE Final  . Staphylococcus aureus 02/12/2018 POSITIVE* NEGATIVE Final   Comment: (NOTE) The Xpert SA Assay (FDA approved for NASAL specimens in patients 54 years of age and older), is one component of a comprehensive surveillance program. It is not intended to diagnose infection nor to guide or monitor treatment. Performed at [facility], [place] [address], [place], [place] [address]   . aPTT 02/12/2018 29  24 - 36 seconds Final   Performed at [facility], [place] [address] [place], [place] [address]  . WBC 02/12/2018 4.6  4.0 - 10.5 K/uL Final  . RBC 02/12/2018 4.43  3.87 - 5.11 MIL/uL Final  . Hemoglobin 02/12/2018 14.5  12.0 - 15.0 g/dL Final  . HCT 02/12/2018 43.6  36.0 - 46.0 % Final  . MCV 02/12/2018 98.4  78.0 - 100.0 fL Final  . MCH 02/12/2018 32.7  26.0 - 34.0 pg Final  . MCHC 02/12/2018 33.3  30.0 - 36.0 g/dL Final  . RDW 02/12/2018 13.6  11.5 - 15.5 % Final  . Platelets 02/12/2018 218  150 - 400 K/uL Final   Performed at [facility], [place]  [address] [place], [place] [address]  . Sodium 02/12/2018 144  135 - 145 mmol/L Final  . Potassium 02/12/2018 4.7  3.5 - 5.1 mmol/L Final  . Chloride 02/12/2018 108  98 - 111 mmol/L Final  . CO2 02/12/2018 28  22 - 32 mmol/L Final  . Glucose, Bld 02/12/2018 114* 70 - 99 mg/dL Final  . BUN 02/12/2018 14  8 - 23  mg/dL Final  . Creatinine, Ser 02/12/2018 0.60  0.44 - 1.00 mg/dL Final  . Calcium 02/12/2018 10.0  8.9 - 10.3 mg/dL Final  . Total Protein 02/12/2018 6.5  6.5 - 8.1 g/dL Final  . Albumin 02/12/2018 3.8  3.5 - 5.0 g/dL Final  . AST 02/12/2018 21  15 - 41 U/L Final  . ALT 02/12/2018 16  0 - 44 U/L Final  . Alkaline Phosphatase 02/12/2018 57  38 - 126 U/L Final  . Total Bilirubin 02/12/2018 0.8  0.3 - 1.2 mg/dL Final  . GFR calc non Af Amer 02/12/2018 >60  >60 mL/min Final  . GFR calc Af Amer 02/12/2018 >60  >60 mL/min Final   Comment: (NOTE) The eGFR has been calculated using the CKD EPI equation. This calculation has not been validated in all clinical situations. eGFR's persistently <60 mL/min signify possible Chronic Kidney Disease.   Georgiann Hahn gap 02/12/2018 8  5 - 15 Final   Performed at [facility], [place] [address], [place], [place] [address]  . Prothrombin Time 02/12/2018 11.6  11.4 - 15.2 seconds Final  . INR 02/12/2018 0.85   Final   Performed at [facility], [place] [address] [place], [place] [address]  . ABO/RH(D) 02/12/2018 A POS   Final  . Antibody Screen 02/12/2018 NEG   Final  . Sample Expiration 02/12/2018 02/20/2018   Final  . Extend sample reason 02/12/2018    Final                   Value:NO TRANSFUSIONS OR PREGNANCY IN THE PAST 3 MONTHS Performed at [place], [place] [address] [place],  [address]   . Hgb A1c MFr Bld 02/12/2018 5.5  4.8 - 5.6 % Final   Comment: (NOTE)         Prediabetes: 5.7 - 6.4         Diabetes: >6.4         Glycemic control for adults with diabetes: <7.0   . Mean Plasma  Glucose 02/12/2018 111  mg/dL Final   Comment: (NOTE) Performed At: [facility] [place], [place] [id number] Rush Farmer MD OL:[id number]   . Glucose-Capillary 02/12/2018 100* 70 - 99 mg/dL Final     X-Rays:No results found.  EKG: Orders placed or performed in visit on 01/17/18  . EKG 12-Lead     Hospital Course: Desiree Pittman is a 67 y.o. who was admitted to [facility]. They were brought to the operating room on 02/17/2018 and underwent Procedure(s): LEFT TOTAL KNEE ARTHROPLASTY.  Patient tolerated the procedure well and was later transferred to the recovery room and then to the orthopaedic floor for postoperative care. They were given PO and IV analgesics for pain control following their surgery. They were given 24 hours of postoperative antibiotics of  Anti-infectives (From admission, onward)   Start     Dose/Rate Route Frequency Ordered Stop   02/17/18 1900  ceFAZolin (ANCEF) IVPB 1 g/50 mL premix     1 g 100 mL/hr over 30 Minutes Intravenous Every 6 hours 02/17/18 1522 02/18/18 0212   02/17/18 1000  ceFAZolin (ANCEF) IVPB 2g/100 mL premix     2 g 200 mL/hr over 30 Minutes Intravenous On call to O.R. 02/17/18 0100 02/17/18 1338     and started on DVT prophylaxis in the form of Aspirin.   PT and OT were ordered for total joint protocol. Discharge planning consulted to help with  postop disposition and equipment needs. Patient had a decent night on the evening of surgery. They started to get up OOB with therapy on POD #1. Hemovac drain was pulled without difficulty on day one. Continued to work with therapy into POD #2. Pt was seen during rounds on day two and was ready to go home pending progress with therapy. Dressing was changed and the incision was clean, dry, and intact with no drainage. Pt worked with therapy for one additional session and was meeting their goals. She was discharged to home later that day in stable condition.  Diet: Diabetic  diet Activity: WBAT Follow-up: in 2 weeks with Dr. Wynelle Link Disposition: Home with virtual physical therapy Discharged Condition: stable   Discharge Instructions    Call MD / Call 911   Complete by:  As directed    If you experience chest pain or shortness of breath, CALL 911 and be transported to the hospital emergency room.  If you develope a fever above 101 F, pus (white drainage) or increased drainage or redness at the wound, or calf pain, call your surgeon's office.   Change dressing   Complete by:  As directed    Change the dressing daily with sterile 4 x 4 inch gauze dressing and apply TED hose.   Constipation Prevention   Complete by:  As directed    Drink plenty of fluids.  Prune juice may be helpful.  You may use a stool softener, such as Colace (over the counter) 100 mg twice a day.  Use MiraLax (over the counter) for constipation as needed.   Diet - low sodium heart healthy   Complete by:  As directed    Discharge instructions   Complete by:  As directed    Dr. Gaynelle Arabian Total Joint Specialist [facility] [address], [place], [place] [address] [phone]  TOTAL KNEE REPLACEMENT POSTOPERATIVE DIRECTIONS  Knee Rehabilitation, Guidelines Following Surgery  Results after knee surgery are often greatly improved when you follow the exercise, range of motion and muscle strengthening exercises prescribed by your doctor. Safety measures are also important to protect the knee from further injury. Any time any of these exercises cause you to have increased pain or swelling in your knee joint, decrease the amount until you are comfortable again and slowly increase them. If you have problems or questions, call your caregiver or physical therapist for advice.   HOME CARE INSTRUCTIONS  Remove items at home which could result in a fall. This includes throw rugs or furniture in walking pathways.  ICE to the affected knee every three hours for 30 minutes at a time  and then as needed for pain and swelling.  Continue to use ice on the knee for pain and swelling from surgery. You may notice swelling that will progress down to the foot and ankle.  This is normal after surgery.  Elevate the leg when you are not up walking on it.   Continue to use the breathing machine which will help keep your temperature down.  It is common for your temperature to cycle up and down following surgery, especially at night when you are not up moving around and exerting yourself.  The breathing machine keeps your lungs expanded and your temperature down. Do not place pillow under knee, focus on keeping the knee straight while resting   DIET You may resume your previous home diet once your are discharged from the hospital.  DRESSING / WOUND CARE / SHOWERING You  may shower 3 days after surgery, but keep the wounds dry during showering.  You may use an occlusive plastic wrap (Press'n Seal for example), NO SOAKING/SUBMERGING IN THE BATHTUB.  If the bandage gets wet, change with a clean dry gauze.  If the incision gets wet, pat the wound dry with a clean towel. You may start showering once you are discharged home but do not submerge the incision under water. Just pat the incision dry and apply a dry gauze dressing on daily. Change the surgical dressing daily and reapply a dry dressing each time.  ACTIVITY Walk with your walker as instructed. Use walker as long as suggested by your caregivers. Avoid periods of inactivity such as sitting longer than an hour when not asleep. This helps prevent blood clots.  You may resume a sexual relationship in one month or when given the OK by your doctor.  You may return to work once you are cleared by your doctor.  Do not drive a car for 6 weeks or until released by you surgeon.  Do not drive while taking narcotics.  WEIGHT BEARING Weight bearing as tolerated with assist device (walker, cane, etc) as directed, use it as long as suggested by your  surgeon or therapist, typically at least 4-6 weeks.  POSTOPERATIVE CONSTIPATION PROTOCOL Constipation - defined medically as fewer than three stools per week and severe constipation as less than one stool per week.  One of the most common issues patients have following surgery is constipation.  Even if you have a regular bowel pattern at home, your normal regimen is likely to be disrupted due to multiple reasons following surgery.  Combination of anesthesia, postoperative narcotics, change in appetite and fluid intake all can affect your bowels.  In order to avoid complications following surgery, here are some recommendations in order to help you during your recovery period.  Colace (docusate) - Pick up an over-the-counter form of Colace or another stool softener and take twice a day as long as you are requiring postoperative pain medications.  Take with a full glass of water daily.  If you experience loose stools or diarrhea, hold the colace until you stool forms back up.  If your symptoms do not get better within 1 week or if they get worse, check with your doctor.  Dulcolax (bisacodyl) - Pick up over-the-counter and take as directed by the product packaging as needed to assist with the movement of your bowels.  Take with a full glass of water.  Use this product as needed if not relieved by Colace only.   MiraLax (polyethylene glycol) - Pick up over-the-counter to have on hand.  MiraLax is a solution that will increase the amount of water in your bowels to assist with bowel movements.  Take as directed and can mix with a glass of water, juice, soda, coffee, or tea.  Take if you go more than two days without a movement. Do not use MiraLax more than once per day. Call your doctor if you are still constipated or irregular after using this medication for 7 days in a row.  If you continue to have problems with postoperative constipation, please contact the office for further assistance and  recommendations.  If you experience "the worst abdominal pain ever" or develop nausea or vomiting, please contact the office immediatly for further recommendations for treatment.  ITCHING  If you experience itching with your medications, try taking only a single pain pill, or even half a pain pill at a  time.  You can also use Benadryl over the counter for itching or also to help with sleep.   TED HOSE STOCKINGS Wear the elastic stockings on both legs for three weeks following surgery during the day but you may remove then at night for sleeping.  MEDICATIONS See your medication summary on the "After Visit Summary" that the nursing staff will review with you prior to discharge.  You may have some home medications which will be placed on hold until you complete the course of blood thinner medication.  It is important for you to complete the blood thinner medication as prescribed by your surgeon.  Continue your approved medications as instructed at time of discharge.  PRECAUTIONS If you experience chest pain or shortness of breath - call 911 immediately for transfer to the hospital emergency department.  If you develop a fever greater that 101 F, purulent drainage from wound, increased redness or drainage from wound, foul odor from the wound/dressing, or calf pain - CONTACT YOUR SURGEON.                                                   FOLLOW-UP APPOINTMENTS Make sure you keep all of your appointments after your operation with your surgeon and caregivers. You should call the office at the above phone number and make an appointment for approximately two weeks after the date of your surgery or on the date instructed by your surgeon outlined in the "After Visit Summary".   RANGE OF MOTION AND STRENGTHENING EXERCISES  Rehabilitation of the knee is important following a knee injury or an operation. After just a few days of immobilization, the muscles of the thigh which control the knee become weakened  and shrink (atrophy). Knee exercises are designed to build up the tone and strength of the thigh muscles and to improve knee motion. Often times heat used for twenty to thirty minutes before working out will loosen up your tissues and help with improving the range of motion but do not use heat for the first two weeks following surgery. These exercises can be done on a training (exercise) mat, on the floor, on a table or on a bed. Use what ever works the best and is most comfortable for you Knee exercises include:  Leg Lifts - While your knee is still immobilized in a splint or cast, you can do straight leg raises. Lift the leg to 60 degrees, hold for 3 sec, and slowly lower the leg. Repeat 10-20 times 2-3 times daily. Perform this exercise against resistance later as your knee gets better.  Quad and Hamstring Sets - Tighten up the muscle on the front of the thigh (Quad) and hold for 5-10 sec. Repeat this 10-20 times hourly. Hamstring sets are done by pushing the foot backward against an object and holding for 5-10 sec. Repeat as with quad sets.  Leg Slides: Lying on your back, slowly slide your foot toward your buttocks, bending your knee up off the floor (only go as far as is comfortable). Then slowly slide your foot back down until your leg is flat on the floor again. Angel Wings: Lying on your back spread your legs to the side as far apart as you can without causing discomfort.  A rehabilitation program following serious knee injuries can speed recovery and prevent re-injury in the future due  to weakened muscles. Contact your doctor or a physical therapist for more information on knee rehabilitation.   IF YOU ARE TRANSFERRED TO A SKILLED REHAB FACILITY If the patient is transferred to a skilled rehab facility following release from the hospital, a list of the current medications will be sent to the facility for the patient to continue.  When discharged from the skilled rehab facility, please have the  facility set up the patient's [place] prior to being released. Also, the skilled facility will be responsible for providing the patient with their medications at time of release from the facility to include their pain medication, the muscle relaxants, and their blood thinner medication. If the patient is still at the rehab facility at time of the two week follow up appointment, the skilled rehab facility will also need to assist the patient in arranging follow up appointment in our office and any transportation needs.  MAKE SURE YOU:  Understand these instructions.  Get help right away if you are not doing well or get worse.    Pick up stool softner and laxative for home use following surgery while on pain medications. Do not submerge incision under water. Please use good hand washing techniques while changing dressing each day. May shower starting three days after surgery. Please use a clean towel to pat the incision dry following showers. Continue to use ice for pain and swelling after surgery. Do not use any lotions or creams on the incision until instructed by your surgeon.   Do not put a pillow under the knee. Place it under the heel.   Complete by:  As directed    Driving restrictions   Complete by:  As directed    No driving for two weeks   TED hose   Complete by:  As directed    Use stockings (TED hose) for three weeks on both leg(s).  You may remove them at night for sleeping.   Weight bearing as tolerated   Complete by:  As directed      Allergies as of 02/19/2018      Reactions   Latex    Skin irritation    Metformin And Related    Diarrhea   Propofol Nausea And Vomiting   Can do with nausea meds      Medication List    STOP taking these medications   meloxicam 7.5 MG tablet Commonly known as:  MOBIC     TAKE these medications   aspirin 325 MG EC tablet Take 1 tablet (325 mg total) by mouth 2 (two) times daily for 19 days. Take one tablet  (325 mg) Aspirin two times a day for three weeks following surgery. Then take one baby Aspirin (81 mg) once a day for three weeks. Then discontinue aspirin.   Biotin 5000 MCG Tabs Take 5,000 mcg by mouth daily.   cetirizine 10 MG tablet Commonly known as:  ZYRTEC Take 10 mg by mouth daily.   gabapentin 300 MG capsule Commonly known as:  NEURONTIN Take 1 capsule (300 mg total) by mouth 3 (three) times daily. Gabapentin 300 mg Protocol Take a 300 mg capsule three times a day for two weeks following surgery. Then take a 300 mg capsule two times a day for two weeks.  Then take a 300 mg capsule once a day for two weeks.  Then discontinue the Gabapentin.   hydrochlorothiazide 12.5 MG tablet Commonly known as:  HYDRODIURIL Take 1 tablet (12.5 mg total) by mouth  daily.   lactobacillus acidophilus Tabs tablet Take 2 tablets by mouth daily.   losartan 50 MG tablet Commonly known as:  COZAAR Take 1 tablet (50 mg total) by mouth daily.   losartan-hydrochlorothiazide 50-12.5 MG tablet Commonly known as:  HYZAAR Take 1 tablet by mouth daily.   methocarbamol 500 MG tablet Commonly known as:  ROBAXIN Take 1 tablet (500 mg total) by mouth every 6 (six) hours as needed for muscle spasms. Notes to patient:  AS NEEDED   NIFEdipine 30 MG 24 hr tablet Commonly known as:  ADALAT CC Take 1 tablet (30 mg total) by mouth daily.   oxyCODONE 5 MG immediate release tablet Commonly known as:  Oxy IR/ROXICODONE Take 1-2 tablets (5-10 mg total) by mouth every 6 (six) hours as needed for moderate pain (pain score 4-6). Notes to patient:  AS NEEDED            Discharge Care Instructions  (From admission, onward)         Start     Ordered   02/19/18 0000  Weight bearing as tolerated     02/19/18 0839   02/19/18 0000  Change dressing    Comments:  Change the dressing daily with sterile 4 x 4 inch gauze dressing and apply TED hose.   02/19/18 5686         Follow-up Information     Gaynelle Arabian, MD. Schedule an appointment as soon as possible for a visit on 03/04/2018.   Specialty:  Orthopedic Surgery Contact information: [address] [place] [address] [phone]           Signed: Theresa Duty, PA-C Orthopedic Surgery 02/24/2018, 12:32 PM

## 2018-03-03 DIAGNOSIS — Z23 Encounter for immunization: Secondary | ICD-10-CM | POA: Diagnosis not present

## 2018-03-03 DIAGNOSIS — L570 Actinic keratosis: Secondary | ICD-10-CM | POA: Diagnosis not present

## 2018-03-03 DIAGNOSIS — L814 Other melanin hyperpigmentation: Secondary | ICD-10-CM | POA: Diagnosis not present

## 2018-04-08 ENCOUNTER — Other Ambulatory Visit: Payer: Self-pay | Admitting: Internal Medicine

## 2018-04-08 DIAGNOSIS — I10 Essential (primary) hypertension: Secondary | ICD-10-CM

## 2018-04-08 MED ORDER — HYDROCHLOROTHIAZIDE 12.5 MG PO TABS: 12.5000 mg | ORAL_TABLET | Freq: Every day | ORAL | 1 refills | Status: DC

## 2018-04-08 MED ORDER — LOSARTAN POTASSIUM 50 MG PO TABS: 50.0000 mg | ORAL_TABLET | Freq: Every day | ORAL | 1 refills | Status: DC

## 2018-04-25 DIAGNOSIS — Z96652 Presence of left artificial knee joint: Secondary | ICD-10-CM | POA: Diagnosis not present

## 2018-04-25 DIAGNOSIS — Z471 Aftercare following joint replacement surgery: Secondary | ICD-10-CM | POA: Diagnosis not present

## 2018-07-31 ENCOUNTER — Inpatient Hospital Stay: Admission: RE | Admit: 2018-07-31 | Payer: Medicare Other | Source: Ambulatory Visit

## 2018-09-02 ENCOUNTER — Ambulatory Visit: Payer: Medicare Other

## 2018-09-03 ENCOUNTER — Ambulatory Visit: Payer: Self-pay

## 2018-09-03 NOTE — Telephone Encounter (Signed)
Agree   TMS 

## 2018-09-03 NOTE — Telephone Encounter (Signed)
Pt. called to report sudden onset, on Sunday, of right lower quad abdominal pain radiating through to her right lower back.  Reported pain was mild on Sunday, then worse on Monday, and became severe last night.  Described that it "felt like a knife in her right lower abdomen".  C/o intermittent chills, fatigue, and urinary urgency, and cloudy urine.  Reported she took Ibuprofen last night, woke up "drenched in sweat" this morning; reported she feels somewhat better.  Reported constant mild pain in right lower quad this AM. Stated she has urge to urinate, and only able to urinate a very small amt.  Denied burning with urination, but stated it is painful in right lower quad of abdomen after urination.  Denied any nausea or vomiting.  Called FC at [facility] and advised to send pt. To UC for eval.  Pt. given recommendation to go to UC.  Care advice given per protocol.  Verb. understanding.  Stated she will arrange for someone to drive her to the UC.     Reason for Disposition . [1] MILD-MODERATE pain AND [2] constant AND [3] present > 2 hours  Answer Assessment - Initial Assessment Questions 1. LOCATION: "Where does it hurt?"     Right lower abdomen 2. RADIATION: "Does the pain shoot anywhere else?" (e.g., chest, back)     Through to lower right back 3. ONSET: "When did the pain begin?" (e.g., minutes, hours or days ago)      Sunday; became worse on Monday 4. SUDDEN: "Gradual or sudden onset?"     Sudden on Sunday  5. PATTERN "Does the pain come and go, or is it constant?"    - If constant: "Is it getting better, staying the same, or worsening?"      (Note: Constant means the pain never goes away completely; most serious pain is constant and it progresses)     - If intermittent: "How long does it last?" "Do you have pain now?"     (Note: Intermittent means the pain goes away completely between bouts)    constant 6. SEVERITY: "How bad is the pain?"  (e.g., Scale 1-10; mild, moderate, or  severe)   - MILD (1-3): doesn't interfere with normal activities, abdomen soft and not tender to touch    - MODERATE (4-7): interferes with normal activities or awakens from sleep, tender to touch    - SEVERE (8-10): excruciating pain, doubled over, unable to do any normal activities      Mild on Sunday; can be severe at times; today pain is mild  7. RECURRENT SYMPTOM: "Have you ever had this type of abdominal pain before?" If so, ask: "When was the last time?" and "What happened that time?"      No 8. CAUSE: "What do you think is causing the abdominal pain?"     Thinks it could be UTI 9. RELIEVING/AGGRAVATING FACTORS: "What makes it better or worse?" (e.g., movement, antacids, bowel movement)     Took Ibuprofen 10. OTHER SYMPTOMS: "Has there been any vomiting, diarrhea, constipation, or urine problems?"       Pain/tenderness in right lower abdomen and lower right back; intermittent chills, cloudy urine; urgency to urinate ; denied nausea or vomiting 11. PREGNANCY: "Is there any chance you are pregnant?" "When was your last menstrual period?"      n/a  Protocols used: ABDOMINAL PAIN - [facility]

## 2018-09-05 ENCOUNTER — Other Ambulatory Visit (INDEPENDENT_AMBULATORY_CARE_PROVIDER_SITE_OTHER): Payer: Medicare Other

## 2018-09-05 ENCOUNTER — Ambulatory Visit (INDEPENDENT_AMBULATORY_CARE_PROVIDER_SITE_OTHER): Payer: Medicare Other | Admitting: Family Medicine

## 2018-09-05 ENCOUNTER — Encounter: Payer: Self-pay | Admitting: Family Medicine

## 2018-09-05 ENCOUNTER — Other Ambulatory Visit: Payer: Self-pay

## 2018-09-05 ENCOUNTER — Telehealth: Payer: Self-pay | Admitting: Internal Medicine

## 2018-09-05 DIAGNOSIS — R300 Dysuria: Secondary | ICD-10-CM

## 2018-09-05 DIAGNOSIS — N390 Urinary tract infection, site not specified: Secondary | ICD-10-CM | POA: Diagnosis not present

## 2018-09-05 LAB — POCT URINALYSIS DIPSTICK
Bilirubin, UA: NEGATIVE
Glucose, UA: NEGATIVE
Ketones, UA: NEGATIVE
Nitrite, UA: POSITIVE
Protein, UA: POSITIVE — AB
Spec Grav, UA: <=1.005 — AB (ref 1.010–1.025)
Urobilinogen, UA: 0.2 E.U./dL
pH, UA: 6 (ref 5.0–8.0)

## 2018-09-05 LAB — URINALYSIS, MICROSCOPIC ONLY

## 2018-09-05 MED ORDER — CEFDINIR 300 MG PO CAPS: 300.0000 mg | ORAL_CAPSULE | Freq: Two times a day (BID) | ORAL | 0 refills | Status: DC

## 2018-09-05 NOTE — Telephone Encounter (Signed)
Patient scheduled for phone appt w/ Philis Nettle, FNP today @ 1:40 pm.  Patient will come to lab to give urine prior to appt time.

## 2018-09-05 NOTE — Progress Notes (Signed)
Patient ID: Desiree Pittman, female   DOB: [birth year], 68 y.o.   MRN: [id number]  Virtual Visit via phone Note  This visit type was conducted due to national recommendations for restrictions regarding the COVID-19 pandemic (e.g. social distancing).  This format is felt to be most appropriate for this patient at this time.  All issues noted in this document were discussed and addressed.  No physical exam was performed (except for noted visual exam findings with Video Visits).   I connected with Desiree Pittman on 09/05/18 at  1:40 PM EDT by telephone and verified that I am speaking with the correct person using two identifiers. Location patient: home Location provider: work or home office Persons participating in the virtual visit: patient, provider  I discussed the limitations, risks, security and privacy concerns of performing an evaluation and management service by telephone and the availability of in person appointments. I also discussed with the patient that there may be a patient responsible charge related to this service. The patient expressed understanding and agreed to proceed.   HPI:  Patient and I connected via telephone today due to suspected UTI.  States she has had increased pressure and burning with urination for the past 4 to 5 days.  Also reports she has had some chills as well.  Patient had been advised previously to go to urgent care for evaluation, but decided not to go because she did not want to be exposed to other sick patients.  Patient bought a over-the-counter urine dipsticks at the pharmacy and determine urine, states that the stick reported positive.  We had patient drop off a urine sample to our lab earlier today to run a point-of-care urinalysis which does show positive for protein, nitrites, leukocytes.  Denies fever.  Denies body aches.  Denies vomiting or diarrhea.  Denies breathing difficulty.  Denies chest pain.  ROS: See pertinent positives and negatives per  HPI.  Past Medical History:  Diagnosis Date  . Abnormal Pap smear of cervix   . Allergy   . Anxiety   . Arthritis   . Blood in stool    colonoscopy 02/2017  . Chicken pox   . Colon polyps   . Depression   . Diabetes mellitus without complication ([place])    type 2  . Diverticulitis   . GERD (gastroesophageal reflux disease)    h/o ulcers ? GI   . Glaucoma    borderline   . Heart murmur   . Hyperlipidemia   . Hypertension   . PONV (postoperative nausea and vomiting)    pull tubes out,etc.    Past Surgical History:  Procedure Laterality Date  . ANAL RECTAL MANOMETRY N/A 03/08/2017   Procedure: ANO RECTAL MANOMETRY;  Surgeon: Ileana Roup, MD;  Location: [facility];  Service: General;  Laterality: N/A;  . CARPAL TUNNEL RELEASE    . COLONOSCOPY WITH PROPOFOL N/A 03/07/2017   Procedure: COLONOSCOPY WITH PROPOFOL;  Surgeon: Ileana Roup, MD;  Location: [facility];  Service: General;  Laterality: N/A;  . finergy surgery     cut finger sewn back on  . HAND SURGERY     ring finger left hand   . JOINT REPLACEMENT     left  hip 2005, 2010, right hip 2010   . TOTAL HIP ARTHROPLASTY  04/23/2012   Procedure: TOTAL HIP ARTHROPLASTY ANTERIOR APPROACH;  Surgeon: Gearlean Alf, MD;  Location: [facility];  Service: Orthopedics;  Laterality: Right;  . TOTAL HIP REVISION  04/23/2012  Procedure: TOTAL HIP REVISION;  Surgeon: Gearlean Alf, MD;  Location: [facility];  Service: Orthopedics;  Laterality: Left;  . TOTAL KNEE ARTHROPLASTY Left 02/17/2018   Procedure: LEFT TOTAL KNEE ARTHROPLASTY;  Surgeon: Gaynelle Arabian, MD;  Location: [facility];  Service: Orthopedics;  Laterality: Left;    Family History  Problem Relation Age of Onset  . Hypertension Mother   . Arthritis Mother   . Heart disease Mother   . Cancer Father        lung died age 69   . Alcohol abuse Son   . Cancer Other        colon caner    Social History   Tobacco Use  . Smoking status: Never Smoker  .  Smokeless tobacco: Never Used  Substance Use Topics  . Alcohol use: Yes    Alcohol/week: 6.0 - 10.0 standard drinks    Types: 6 - 10 Standard drinks or equivalent per week    Comment: 1 bottle qhs most nights x 1.5 years prior to 03/2017    Current Outpatient Medications:  .  Biotin 5000 MCG TABS, Take 5,000 mcg by mouth daily., Disp: , Rfl:  .  cetirizine (ZYRTEC) 10 MG tablet, Take 10 mg by mouth daily., Disp: , Rfl:  .  gabapentin (NEURONTIN) 300 MG capsule, Take 1 capsule (300 mg total) by mouth 3 (three) times daily. Gabapentin 300 mg Protocol Take a 300 mg capsule three times a day for two weeks following surgery. Then take a 300 mg capsule two times a day for two weeks.  Then take a 300 mg capsule once a day for two weeks.  Then discontinue the Gabapentin., Disp: 84 capsule, Rfl: 0 .  hydrochlorothiazide (HYDRODIURIL) 12.5 MG tablet, Take 1 tablet (12.5 mg total) by mouth daily., Disp: 90 tablet, Rfl: 0 .  hydrochlorothiazide (HYDRODIURIL) 12.5 MG tablet, Take 1 tablet (12.5 mg total) by mouth daily., Disp: 90 tablet, Rfl: 1 .  lactobacillus acidophilus (BACID) TABS tablet, Take 2 tablets by mouth daily., Disp: , Rfl:  .  losartan (COZAAR) 50 MG tablet, Take 1 tablet (50 mg total) by mouth daily., Disp: 90 tablet, Rfl: 0 .  losartan (COZAAR) 50 MG tablet, Take 1 tablet (50 mg total) by mouth daily., Disp: 90 tablet, Rfl: 1 .  losartan-hydrochlorothiazide (HYZAAR) 50-12.5 MG tablet, Take 1 tablet by mouth daily., Disp: 90 tablet, Rfl: 3 .  methocarbamol (ROBAXIN) 500 MG tablet, Take 1 tablet (500 mg total) by mouth every 6 (six) hours as needed for muscle spasms., Disp: 40 tablet, Rfl: 0 .  NIFEdipine (PROCARDIA-XL/ADALAT CC) 30 MG 24 hr tablet, Take 1 tablet (30 mg total) by mouth daily., Disp: 90 tablet, Rfl: 3 .  oxyCODONE (OXY IR/ROXICODONE) 5 MG immediate release tablet, Take 1-2 tablets (5-10 mg total) by mouth every 6 (six) hours as needed for moderate pain (pain score 4-6)., Disp:  56 tablet, Rfl: 0 .  cefdinir (OMNICEF) 300 MG capsule, Take 1 capsule (300 mg total) by mouth 2 (two) times daily., Disp: 10 capsule, Rfl: 0  EXAM:  GENERAL: alert, oriented, sounds well and in no acute distress  LUNGS: Speaking in full sentences, no signs of respiratory distress, breathing rate appears normal, no obvious SOB, gasping or wheezing  PSYCH/NEURO: pleasant and cooperative, no obvious depression or anxiety, speech and thought processing grossly intact  ASSESSMENT AND PLAN:  Discussed the following assessment and plan:  Urinary tract infection without hematuria, site unspecified - Plan: cefdinir (OMNICEF) 300 MG capsule  Dysuria  Patient's urinalysis is positive for bacteria and nitrite with indicates she most likely has UTI.  We will treat with cefdinir twice daily for 5 days.  She has been advised to increase water intake, avoid excess sugary caffeinated beverages, wear cotton underwear and always wipe front to back.  Urine culture was also collected today and has been sent to lab to be sure we are on correct antibiotic treatment.  Patient is aware she will be called with urine culture results and be advised if the antibiotic does need to be changed.   I discussed the assessment and treatment plan with the patient. The patient was provided an opportunity to ask questions and all were answered. The patient agreed with the plan and demonstrated an understanding of the instructions.   The patient was advised to call back or seek an in-person evaluation if the symptoms worsen or if the condition fails to improve as anticipated.  I provided 12 minutes of non-face-to-face time during this encounter.   Jodelle Green, FNP

## 2018-09-05 NOTE — Telephone Encounter (Signed)
FYI Reason for CRM: Patient called in in regards to a past nurse triage that happened 4/22, for her chills and possible UTI. States she did not go into urgent care as advised because she did not deem it necessary with everything going on and didn't want to be around sick people. Patient states that she started to feel worse yesterday and was advised by her neighbor, who is a Marine scientist, to go and pick up a test kit for a UTI and states it came back positive. Patient now is asking for antibiotics due to the "positive" from her test. Call back is [phone] Pt scheduled to see NP Philis Nettle today at 1:40pm

## 2018-09-05 NOTE — Telephone Encounter (Signed)
Copied from [place] [id number]. Topic: General - Inquiry >> Sep 05, 2018 10:46 AM Richardo Priest, NT wrote: Reason for CRM: Patient called in in regards to a past nurse triage that happened 4/22, for her chills and possible UTI. States she did not go into urgent care as advised because she did not deem it necessary with everything going on and didn't want to be around sick people. Patient states that she started to feel worse yesterday and was advised by her neighbor, who is a Marine scientist, to go and pick up a test kit for a UTI and states it came back positive. Patient now is asking for antibiotics due to the "positive" from her test. Call back is [phone].

## 2018-09-07 LAB — URINE CULTURE
MICRO NUMBER:: 420039
SPECIMEN QUALITY:: ADEQUATE

## 2018-09-09 ENCOUNTER — Telehealth: Payer: Self-pay | Admitting: Internal Medicine

## 2018-09-09 ENCOUNTER — Ambulatory Visit (INDEPENDENT_AMBULATORY_CARE_PROVIDER_SITE_OTHER): Payer: Medicare Other | Admitting: Internal Medicine

## 2018-09-09 DIAGNOSIS — N3000 Acute cystitis without hematuria: Secondary | ICD-10-CM | POA: Diagnosis not present

## 2018-09-09 DIAGNOSIS — E559 Vitamin D deficiency, unspecified: Secondary | ICD-10-CM | POA: Diagnosis not present

## 2018-09-09 DIAGNOSIS — K649 Unspecified hemorrhoids: Secondary | ICD-10-CM

## 2018-09-09 DIAGNOSIS — Z13220 Encounter for screening for lipoid disorders: Secondary | ICD-10-CM | POA: Diagnosis not present

## 2018-09-09 DIAGNOSIS — K625 Hemorrhage of anus and rectum: Secondary | ICD-10-CM | POA: Diagnosis not present

## 2018-09-09 DIAGNOSIS — I10 Essential (primary) hypertension: Secondary | ICD-10-CM

## 2018-09-09 NOTE — Progress Notes (Signed)
Failed [facility]oxy Note-Telephone note   I connected with Desiree Pittman  on 09/09/18 at  2:11 PM EDT by telephone and verified that I am speaking with the correct person using two identifiers.  Location patient: car Location provider:work  Persons participating in the virtual visit: patient, provider  I discussed the limitations of evaluation and management by telemedicine and the availability of in person appointments. The patient expressed understanding and agreed to proceed.   HPI: 1. 03/07/17 colonoscopy + ext/int hemorrhoids c/o hemorrhoid bleeding and open wound around anus if urinates she notices blood if riding a bike she notices blood and it is red blood she thinks hemorrhoids bleeding she contacted Dr. Annye English in [facility] colorectal surgeon) who told her he does not tx hemorrhoids and rec prep H, increased fiber and epsolm salt baths w/o help. She reports in the past she also had anal stool leakage and did pelvic PT  -she wants to seek tx with specialist to get tx for hemorrhoids I.e band ligation and reports in the past has possibly had saline injections   2. UTI sx's improved s/p omnicef w/o sx's  3. Knee surgery went well and feeling better   ROS: See pertinent positives and negatives per HPI.  Past Medical History:  Diagnosis Date  . Abnormal Pap smear of cervix   . Allergy   . Anxiety   . Arthritis   . Blood in stool    colonoscopy 02/2017  . Chicken pox   . Colon polyps   . Depression   . Diabetes mellitus without complication ([place])    type 2  . Diverticulitis   . GERD (gastroesophageal reflux disease)    h/o ulcers ? GI   . Glaucoma    borderline   . Heart murmur   . Hyperlipidemia   . Hypertension   . PONV (postoperative nausea and vomiting)    pull tubes out,etc.    Past Surgical History:  Procedure Laterality Date  . ANAL RECTAL MANOMETRY N/A 03/08/2017   Procedure: ANO RECTAL MANOMETRY;  Surgeon: Ileana Roup, MD;  [facility];   Service: General;  Laterality: N/A;  . CARPAL TUNNEL RELEASE    . COLONOSCOPY WITH PROPOFOL N/A 03/07/2017   Procedure: COLONOSCOPY WITH PROPOFOL;  Surgeon: Ileana Roup, MD;  [facility];  Service: General;  Laterality: N/A;  . finergy surgery     cut finger sewn back on  . HAND SURGERY     ring finger left hand   . JOINT REPLACEMENT     left  hip 2005, 2010, right hip 2010   . TOTAL HIP ARTHROPLASTY  04/23/2012   Procedure: TOTAL HIP ARTHROPLASTY ANTERIOR APPROACH;  Surgeon: Gearlean Alf, MD;  [facility];  Service: Orthopedics;  Laterality: Right;  . TOTAL HIP REVISION  04/23/2012   Procedure: TOTAL HIP REVISION;  Surgeon: Gearlean Alf, MD;  [facility];  Service: Orthopedics;  Laterality: Left;  . TOTAL KNEE ARTHROPLASTY Left 02/17/2018   Procedure: LEFT TOTAL KNEE ARTHROPLASTY;  Surgeon: Gaynelle Arabian, MD;  [facility]: [facility];  Service: Orthopedics;  Laterality: Left;    Family History  Problem Relation Age of Onset  . Hypertension Mother   . Arthritis Mother   . Heart disease Mother   . Cancer Father        lung died age 39   . Alcohol abuse Son   . Cancer Other        colon caner  SOCIAL HX: Copywriter, advertising    Current Outpatient Medications:  .  Biotin 5000 MCG TABS, Take 5,000 mcg by mouth daily., Disp: , Rfl:  .  cefdinir (OMNICEF) 300 MG capsule, Take 1 capsule (300 mg total) by mouth 2 (two) times daily., Disp: 10 capsule, Rfl: 0 .  cetirizine (ZYRTEC) 10 MG tablet, Take 10 mg by mouth daily., Disp: , Rfl:  .  gabapentin (NEURONTIN) 300 MG capsule, Take 1 capsule (300 mg total) by mouth 3 (three) times daily. Gabapentin 300 mg Protocol Take a 300 mg capsule three times a day for two weeks following surgery. Then take a 300 mg capsule two times a day for two weeks.  Then take a 300 mg capsule once a day for two weeks.  Then discontinue the Gabapentin., Disp: 84 capsule, Rfl: 0 .  hydrochlorothiazide (HYDRODIURIL) 12.5 MG tablet,  Take 1 tablet (12.5 mg total) by mouth daily., Disp: 90 tablet, Rfl: 0 .  hydrochlorothiazide (HYDRODIURIL) 12.5 MG tablet, Take 1 tablet (12.5 mg total) by mouth daily., Disp: 90 tablet, Rfl: 1 .  lactobacillus acidophilus (BACID) TABS tablet, Take 2 tablets by mouth daily., Disp: , Rfl:  .  losartan (COZAAR) 50 MG tablet, Take 1 tablet (50 mg total) by mouth daily., Disp: 90 tablet, Rfl: 0 .  losartan (COZAAR) 50 MG tablet, Take 1 tablet (50 mg total) by mouth daily., Disp: 90 tablet, Rfl: 1 .  losartan-hydrochlorothiazide (HYZAAR) 50-12.5 MG tablet, Take 1 tablet by mouth daily., Disp: 90 tablet, Rfl: 3 .  methocarbamol (ROBAXIN) 500 MG tablet, Take 1 tablet (500 mg total) by mouth every 6 (six) hours as needed for muscle spasms., Disp: 40 tablet, Rfl: 0 .  NIFEdipine (PROCARDIA-XL/ADALAT CC) 30 MG 24 hr tablet, Take 1 tablet (30 mg total) by mouth daily., Disp: 90 tablet, Rfl: 3 .  oxyCODONE (OXY IR/ROXICODONE) 5 MG immediate release tablet, Take 1-2 tablets (5-10 mg total) by mouth every 6 (six) hours as needed for moderate pain (pain score 4-6)., Disp: 56 tablet, Rfl: 0  EXAM:  VITALS per patient if applicable:  GENERAL: alert, oriented, appears well and in no acute distress  PSYCH/NEURO: pleasant and cooperative, no obvious depression or anxiety, speech and thought processing grossly intact  ASSESSMENT AND PLAN:  Discussed the following assessment and plan:  Hemorrhoids, with rectal bleeding  -will check with Dr. Charlotte Crumb to see if they do hemorrhoid procedures vs Dr. Dahlia Byes, [place] or [facility]  -will refer in the future  Pt to try to upload photo to my chart that she took   Acute cystitis without hematuria -sx's resolved   HM Flu shot pt will get  Tdap 06/24/17 prevnar 10/16/17 pna 23 in 1 year  Disc shingrix per pt had old shingrix vaccine   [facility]. -negative 07/29/16    mammoabnl 06/05/17 repeat neg 2/1/19due 06/14/2018  ordered pt needs to schedule  Colonoscopy had 03/07/17 reviewed 2 mm polpy diverticulahyperplastic, Ext and Int Hemorrhoids Dr. Annye English f/u in 5 years   rec alcohol cessationshe has cut back congratulated  Declines STD testing   Consider DEXA in future  referred dermatology Dr. Delman Cheadle in [place] pt saw in 2019  Fasting labs for future     I discussed the assessment and treatment plan with the patient. The patient was provided an opportunity to ask questions and all were answered. The patient agreed with the plan and demonstrated an understanding of the instructions.   The patient  was advised to call back or seek an in-person evaluation if the symptoms worsen or if the condition fails to improve as anticipated.  Time spent 20 minutes  Delorise Jackson, MD

## 2018-09-09 NOTE — Telephone Encounter (Signed)
Surgery in [place] removes hemorrhoids and GI Dr. Marius Ditch does banding/ligation  Which one does Desiree Pittman want to go to?   [facility]

## 2018-09-10 ENCOUNTER — Other Ambulatory Visit: Payer: Self-pay | Admitting: Internal Medicine

## 2018-09-10 DIAGNOSIS — K649 Unspecified hemorrhoids: Secondary | ICD-10-CM

## 2018-09-10 DIAGNOSIS — K625 Hemorrhage of anus and rectum: Secondary | ICD-10-CM

## 2018-09-10 NOTE — Telephone Encounter (Signed)
Patient would like to go to Dr Marius Ditch

## 2018-09-11 ENCOUNTER — Encounter: Payer: Self-pay | Admitting: Internal Medicine

## 2018-09-17 ENCOUNTER — Encounter: Payer: Self-pay | Admitting: Gastroenterology

## 2018-09-17 ENCOUNTER — Ambulatory Visit (INDEPENDENT_AMBULATORY_CARE_PROVIDER_SITE_OTHER): Payer: Medicare Other | Admitting: Gastroenterology

## 2018-09-17 ENCOUNTER — Other Ambulatory Visit: Payer: Self-pay

## 2018-09-17 DIAGNOSIS — K623 Rectal prolapse: Secondary | ICD-10-CM | POA: Diagnosis not present

## 2018-09-17 DIAGNOSIS — K643 Fourth degree hemorrhoids: Secondary | ICD-10-CM

## 2018-09-17 DIAGNOSIS — K642 Third degree hemorrhoids: Secondary | ICD-10-CM

## 2018-09-17 MED ORDER — HYDROCORTISONE (PERIANAL) 2.5 % EX CREA: 1.0000 "application " | TOPICAL_CREAM | Freq: Two times a day (BID) | CUTANEOUS | 1 refills | Status: DC

## 2018-09-17 NOTE — Progress Notes (Signed)
Desiree Sear, MD [address]  [place]  [place], [place] [address]  Main: [phone]  Fax: [phone]    Gastroenterology Consultation Video Visit  Referring Provider:     McLean-Scocuzza, Olivia Pittman * Primary Care Physician:  McLean-Scocuzza, Nino Glow, MD Primary Gastroenterologist:  Dr. Cephas Pittman Reason for Consultation:     Symptomatic hemorrhoids        HPI:   Desiree Pittman is a 68 y.o. female referred by Dr. Terese Pittman, Nino Glow, MD  for consultation & management of  symptomatic hemorrhoids  Virtual Visit Video Note  I connected with Desiree Pittman on 09/17/18 at  9:00 AM EDT by video and verified that I am speaking with the correct person using two identifiers.   I discussed the limitations, risks, security and privacy concerns of performing an evaluation and management service by video and the availability of in person appointments. I also discussed with the patient that there may be a patient responsible charge related to this service. The patient expressed understanding and agreed to proceed.  Location of the Patient: Home  Location of the provider: Home office  Persons participating in the visit: Patient and provider   History of Present Illness: Desiree Pittman has been suffering from symptoms related to hemorrhoids ever since she gave birth to her children.  She also underwent sclerotherapy by a surgeon 20years ago after lancing of external hemorrhoids due to recurrence of symptoms  In 2017, she was experiencing severe symptoms, used to have rectal bleeding during a BM, but later in between BMs also.  At that time, she was seen by colorectal surgeon, Dr Desiree Pittman in [place] who initially did colonoscopy which revealed internal and external hemorrhoids.  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  She was taking fiber supplements suggested by Dr. Dema Pittman which caused severe diarrhea.  She then switched to probiotics as suggested by the pelvic floor physical therapist which remarkably helped with bloating and bowel pattern.  She denies constipation, pushing or straining. She continues to suffer from rectal bleeding, severe pain.  She does sitz bath, Preparation H which is not helping.  She is using Emuaid max which provides some symptom relief.  But bleeding has been an ongoing issue.  She also tells me that she cannot manually reduce her hemorrhoids. Patient has sent me a picture of hemorrhoid on my chart which is concerning for possible rectal prolapse and grade 4 hemorrhoids She does not have anemia  NSAIDs: None  Antiplts/Anticoagulants/Anti thrombotics: None  GI Procedures:  Colonoscopy in 03/07/2017 - Non-thrombosed external hemorrhoids and non-thrombosed internal hemorrhoids found Colon, polyp(s), distal sigmoid - HYPERPLASTIC POLYP (ONE FRAGMENT). - NO ADENOMATOUS CHANGE OR MALIGNANCY. - Non-thrombosed external hemorrhoids and non-thrombosed internal hemorrhoids found  Past Medical History:  Diagnosis Date   Abnormal Pap smear of cervix    Allergy    Anxiety    Arthritis    Blood in stool    colonoscopy 02/2017   Chicken pox    Colon polyps    Depression    Diabetes mellitus without complication ([place])    type 2   Diverticulitis    GERD (gastroesophageal reflux disease)    h/o ulcers ? GI    Glaucoma    borderline    Heart murmur    Hyperlipidemia    Hypertension    PONV (  postoperative nausea and vomiting)    pull tubes out,etc.    Past Surgical History:  Procedure Laterality Date   ANAL RECTAL MANOMETRY N/A 03/08/2017   Procedure: ANO RECTAL MANOMETRY;  Surgeon: Desiree Roup, MD;  Location: [facility];  Service: General;  Laterality: N/A;   CARPAL TUNNEL RELEASE     COLONOSCOPY WITH PROPOFOL N/A  03/07/2017   Procedure: COLONOSCOPY WITH PROPOFOL;  Surgeon: Desiree Roup, MD;  Location: [facility];  Service: General;  Laterality: N/A;   finergy surgery     cut finger sewn back on   HAND SURGERY     ring finger left hand    JOINT REPLACEMENT     left  hip 2005, 2010, right hip 2010    TOTAL HIP ARTHROPLASTY  04/23/2012   Procedure: TOTAL HIP ARTHROPLASTY ANTERIOR APPROACH;  Surgeon: Gearlean Alf, MD;  Location: [facility];  Service: Orthopedics;  Laterality: Right;   TOTAL HIP REVISION  04/23/2012   Procedure: TOTAL HIP REVISION;  Surgeon: Gearlean Alf, MD;  Location: [facility];  Service: Orthopedics;  Laterality: Left;   TOTAL KNEE ARTHROPLASTY Left 02/17/2018   Procedure: LEFT TOTAL KNEE ARTHROPLASTY;  Surgeon: Gaynelle Arabian, MD;  Location: [facility];  Service: Orthopedics;  Laterality: Left;    NIFEdipine (PROCARDIA-XL/ADALAT CC) 30 MG 24 hr tablet Take 1 tablet (30 mg total) by mouth daily. Patient not taking: Reported on 09/17/2018 01/17/18   McLean-Scocuzza, Nino Glow, MD  oxyCODONE (OXY IR/ROXICODONE) 5 MG immediate release tablet Take 1-2 tablets (5-10 mg total) by mouth every 6 (six) hours as needed for moderate pain (pain score 4-6). Patient not taking: Reported on 09/17/2018 02/19/18   Derl Barrow, PA    Family History  Problem Relation Age of Onset   Hypertension Mother    Arthritis Mother    Heart disease Mother    Cancer Father        lung died age 8    Alcohol abuse Son    Cancer Other        colon caner      Social History   Tobacco Use   Smoking status: Never Smoker   Smokeless tobacco: Never Used  Substance Use Topics   Alcohol use: Yes    Alcohol/week: 6.0 - 10.0 standard drinks    Types: 6 - 10 Standard drinks or equivalent per week    Comment: 1 bottle qhs most nights x 1.5 years prior to 03/2017   Drug use: No    Allergies as of 09/17/2018 - Review Complete 09/17/2018  Allergen Reaction Noted   Latex  06/24/2017    Metformin and related  04/12/2017   Propofol Nausea And Vomiting 02/28/2017    Current Outpatient Medications:    Biotin 5000 MCG TABS, Take 5,000 mcg by mouth daily., Disp: , Rfl:    cefdinir (OMNICEF) 300 MG capsule, Take 1 capsule (300 mg total) by mouth 2 (two) times daily. (Patient not taking: Reported on 09/17/2018), Disp: 10 capsule, Rfl: 0   cetirizine (ZYRTEC) 10 MG tablet, Take 10 mg by mouth daily., Disp: , Rfl:    gabapentin (NEURONTIN) 300 MG capsule, Take 1 capsule (300 mg total) by mouth 3 (three) times daily. Gabapentin 300 mg Protocol Take a 300 mg capsule three times a day for two weeks following surgery. Then take a 300 mg capsule two times a day for two weeks.  Then take a 300 mg capsule once a day for two weeks.  Then discontinue  the Gabapentin. (Patient not taking: Reported on 09/17/2018), Disp: 84 capsule, Rfl: 0   hydrochlorothiazide (HYDRODIURIL) 12.5 MG tablet, Take 1 tablet (12.5 mg total) by mouth daily., Disp: 90 tablet, Rfl: 0   hydrochlorothiazide (HYDRODIURIL) 12.5 MG tablet, Take 1 tablet (12.5 mg total) by mouth daily. (Patient not taking: Reported on 09/17/2018), Disp: 90 tablet, Rfl: 1   hydrocortisone (ANUSOL-HC) 2.5 % rectal cream, Place 1 application rectally 2 (two) times daily., Disp: 30 g, Rfl: 1   lactobacillus acidophilus (BACID) TABS tablet, Take 2 tablets by mouth daily., Disp: , Rfl:    losartan (COZAAR) 50 MG tablet, Take 1 tablet (50 mg total) by mouth daily., Disp: 90 tablet, Rfl: 0   losartan (COZAAR) 50 MG tablet, Take 1 tablet (50 mg total) by mouth daily. (Patient not taking: Reported on 09/17/2018), Disp: 90 tablet, Rfl: 1   losartan-hydrochlorothiazide (HYZAAR) 50-12.5 MG tablet, Take 1 tablet by mouth daily. (Patient not taking: Reported on 09/17/2018), Disp: 90 tablet, Rfl: 3   methocarbamol (ROBAXIN) 500 MG tablet, Take 1 tablet (500 mg total) by mouth every 6 (six) hours as needed for muscle spasms. (Patient not taking: Reported on  09/17/2018), Disp: 40 tablet, Rfl: 0   NIFEdipine (PROCARDIA-XL/ADALAT CC) 30 MG 24 hr tablet, Take 1 tablet (30 mg total) by mouth daily. (Patient not taking: Reported on 09/17/2018), Disp: 90 tablet, Rfl: 3   oxyCODONE (OXY IR/ROXICODONE) 5 MG immediate release tablet, Take 1-2 tablets (5-10 mg total) by mouth every 6 (six) hours as needed for moderate pain (pain score 4-6). (Patient not taking: Reported on 09/17/2018), Disp: 56 tablet, Rfl: 0   Imaging Studies: Reviewed  Assessment and Plan:   Desiree Pittman is a 68 y.o. pleasant Caucasian female with no significant past medical history, has been suffering from chronic severe symptomatic hemorrhoids,grade IV and and possible rectal prolapse.  Patient will not benefit from hemorrhoid ligation given the severity.  I discussed her case with general surgeon, Dr. Dahlia Byes who agreed that she will most likely need surgery and will see her in his office first.  Communicated the same with patient and she is agreeable In the meantime, asked her to try Anusol cream twice daily in addition to what she is currently doing   Follow Up Instructions:  I discussed the assessment and treatment plan with the patient. The patient was provided an opportunity to ask questions and all were answered. The patient agreed with the plan and demonstrated an understanding of the instructions.   The patient was advised to call back or seek an in-person evaluation if the symptoms worsen or if the condition fails to improve as anticipated.  I provided 25 minutes of face-to-face time during this encounter.   Follow up as needed   Desiree Darby, MD

## 2018-09-22 ENCOUNTER — Other Ambulatory Visit: Payer: Self-pay

## 2018-09-22 ENCOUNTER — Ambulatory Visit (INDEPENDENT_AMBULATORY_CARE_PROVIDER_SITE_OTHER): Payer: Medicare Other | Admitting: Surgery

## 2018-09-22 ENCOUNTER — Encounter: Payer: Self-pay | Admitting: Surgery

## 2018-09-22 VITALS — BP 128/74 | HR 87 | Temp 97.7°F | Resp 14 | Ht 63.0 in | Wt 126.0 lb

## 2018-09-22 DIAGNOSIS — K623 Rectal prolapse: Secondary | ICD-10-CM

## 2018-09-22 DIAGNOSIS — K643 Fourth degree hemorrhoids: Secondary | ICD-10-CM | POA: Diagnosis not present

## 2018-09-22 NOTE — Progress Notes (Signed)
Patient ID: Desiree Pittman, female   DOB: 1950-12-06, 68 y.o.   MRN: [id number]  HPI Desiree Pittman is a 68 y.o. female is a 68 year old very pleasant female with a history of hemorrhoids for the last 30 years or so.  Reports that she has had multiple interventions including hemorrhoidectomy as well as injection.  She was seen Dr. Nadeen Landau a colorectal surgeon in [place] and the last time she talk to him he expressed that there was nothing he could do. She reports intermittent moderate sharp pain and chronic drainage. Np fevers or chills . Last colonoscopy no major findings. Cbc and bmp nml  HPI  Past Medical History:  Diagnosis Date  . Abnormal Pap smear of cervix   . Allergy   . Anxiety   . Arthritis   . Blood in stool    colonoscopy 02/2017  . Chicken pox   . Colon polyps   . Depression   . Diabetes mellitus without complication ([place])    type 2  . Diverticulitis   . GERD (gastroesophageal reflux disease)    h/o ulcers ? GI   . Glaucoma    borderline   . Heart murmur   . Hyperlipidemia   . Hypertension   . PONV (postoperative nausea and vomiting)    pull tubes out,etc.    Past Surgical History:  Procedure Laterality Date  . ANAL RECTAL MANOMETRY N/A 03/08/2017   Procedure: ANO RECTAL MANOMETRY;  Surgeon: Ileana Roup, MD;  Location: [facility];  Service: General;  Laterality: N/A;  . CARPAL TUNNEL RELEASE    . COLONOSCOPY WITH PROPOFOL N/A 03/07/2017   Procedure: COLONOSCOPY WITH PROPOFOL;  Surgeon: Ileana Roup, MD;  Location: [facility];  Service: General;  Laterality: N/A;  . finergy surgery     cut finger sewn back on  . HAND SURGERY     ring finger left hand   . JOINT REPLACEMENT     left  hip 2005, 2010, right hip 2010   . TOTAL HIP ARTHROPLASTY  04/23/2012   Procedure: TOTAL HIP ARTHROPLASTY ANTERIOR APPROACH;  Surgeon: Gearlean Alf, MD;  Location: [facility];  Service: Orthopedics;  Laterality: Right;  . TOTAL HIP REVISION   04/23/2012   Procedure: TOTAL HIP REVISION;  Surgeon: Gearlean Alf, MD;  Location: [facility];  Service: Orthopedics;  Laterality: Left;  . TOTAL KNEE ARTHROPLASTY Left 02/17/2018   Procedure: LEFT TOTAL KNEE ARTHROPLASTY;  Surgeon: Gaynelle Arabian, MD;  Location: [facility];  Service: Orthopedics;  Laterality: Left;    Family History  Problem Relation Age of Onset  . Hypertension Mother   . Arthritis Mother   . Heart disease Mother   . Cancer Father        lung died age 78   . Alcohol abuse Son   . Cancer Other        colon caner     Social History Social History   Tobacco Use  . Smoking status: Never Smoker  . Smokeless tobacco: Never Used  Substance Use Topics  . Alcohol use: Yes    Alcohol/week: 6.0 - 10.0 standard drinks    Types: 6 - 10 Standard drinks or equivalent per week    Comment: 1 bottle qhs most nights x 1.5 years prior to 03/2017  . Drug use: No    Allergies  Allergen Reactions  . Latex     Skin irritation   . Metformin And Related     Diarrhea   .  Propofol Nausea And Vomiting    Can do with nausea meds    Current Outpatient Medications  Medication Sig Dispense Refill  . Biotin 5000 MCG TABS Take 5,000 mcg by mouth daily.    . cetirizine (ZYRTEC) 10 MG tablet Take 10 mg by mouth daily.    . hydrochlorothiazide (HYDRODIURIL) 12.5 MG tablet Take 1 tablet (12.5 mg total) by mouth daily. 90 tablet 1  . hydrocortisone (ANUSOL-HC) 2.5 % rectal cream Place 1 application rectally 2 (two) times daily. 30 g 1  . lactobacillus acidophilus (BACID) TABS tablet Take 2 tablets by mouth daily.    Marland Kitchen losartan (COZAAR) 50 MG tablet Take 1 tablet (50 mg total) by mouth daily. 90 tablet 0  . losartan (COZAAR) 50 MG tablet Take 1 tablet (50 mg total) by mouth daily. 90 tablet 1  . NIFEdipine (PROCARDIA-XL/ADALAT CC) 30 MG 24 hr tablet Take 1 tablet (30 mg total) by mouth daily. 90 tablet 3   No current facility-administered medications for this visit.      Review of  Systems Full ROS  was asked and was negative except for the information on the HPI  Physical Exam Blood pressure 128/74, pulse 87, temperature 97.7 F (36.5 C), resp. rate 14, height 5\' 3"  (1.6 m), weight 126 lb (57.2 kg), SpO2 98 %. CONSTITUTIONAL: NAD EYES: Pupils are equal, round, and reactive to light, Sclera are non-icteric. EARS, NOSE, MOUTH AND THROAT: The oropharynx is clear. The oral mucosa is pink and moist. Hearing is intact to voice. LYMPH NODES:  Lymph nodes in the neck are normal. RESPIRATORY:  Lungs are clear. There is normal respiratory effort, with equal breath sounds bilaterally, and without pathologic use of accessory muscles. CARDIOVASCULAR: Heart is regular without murmurs, gallops, or rubs. GI: The abdomen is soft, nontender, and nondistended. There are no palpable masses. There is no hepatosplenomegaly. There are normal bowel sounds in all quadrants. Rectal: there is full prolapse with Left anterolateral hemorrhoid that has some superficial ulceration.    MUSCULOSKELETAL: Normal muscle strength and tone. No cyanosis or edema.   SKIN: Turgor is good and there are no pathologic skin lesions or ulcers. NEUROLOGIC: Motor and sensation is grossly normal. Cranial nerves are grossly intact. PSYCH:  Oriented to person, place and time. Affect is normal.  Data Reviewed  I have personally reviewed the patient's imaging, laboratory findings and medical records.    Assessment/Plan Rectal prolapse with Grade IV hemorrhoid. I do think that she is best suited for colorectal evaluation and potentially a stapled hemorrhoidectomy  If in fact Dr Audie Clear considers that this is a reasonable options. Given her chronic drainage problems it is a difficult situation..  I discussed with her that since she has a degree of prolapse it would be challenging to have perfect results. I am placing a referral for colorectal surgery with Dr. Audie Clear .    Caroleen Hamman, MD FACS General Surgeon 09/22/2018,  1:50 PM

## 2018-09-22 NOTE — Patient Instructions (Addendum)
We will refer you to Dr Donia Ast at [facility] for assessment and treatment  Follow up as needed    Rectal Prolapse, Adult Rectal prolapse happens when the inside of the last section of the large intestine (rectum) drops down into an abnormal position. There are two types of rectal prolapse:  Partial. In partial rectal prolapse, the inner lining of the rectum falls or sinks out of place and may stick out of the anus.  Complete. In complete rectal prolapse, all layers of the rectum fall or sink out of place and may stick out of the anus. At first, rectal prolapse may be temporary. It may happen only when you are having a bowel movement. Over time, the prolapse will likely get worse. It may start to happen more often and cause uncomfortable symptoms. Eventually, the prolapse may happen when you are walking or standing. Surgery is often needed for this condition. What are the causes? This condition may be caused by weakness in the muscles that attach the rectum to the inside of the lower abdomen. The exact cause of this muscle weakness is often not known. What increases the risk? You are more likely to develop this condition if you:  Have a history of chronic constipation or diarrhea.  Frequently strain to have a bowel movement.  Are a woman who is 46 years of age or older.  Have a neurological disorder, such as multiple sclerosis, or lower spinal cord injury.  Are a woman who has been pregnant many times.  Have had pelvic or rectal surgery.  Have cystic fibrosis. What are the signs or symptoms? The main symptom of this condition is a red mass of tissue sticking out of your anus. The mass may appear inflamed, have mucus, or bleed slightly.  At first, the mass may only appear after a bowel movement.  The mass may then start to appear more often. Other symptoms may include:  Discomfort in the anus and rectum.  Constipation.  Feeling that stool is not completely emptied  from the rectum.  Diarrhea.  Leaking of stool, mucus, or blood from the anus (fecal incontinence).  Feeling like you are sitting on a ball. How is this diagnosed? This condition may be diagnosed based on your symptoms and a physical exam.  During the exam, you may be asked to squat and strain as though you are having a bowel movement.  You may also have tests, such as: ? A rectal exam using a flexible scope (sigmoidoscopy or colonoscopy). ? A procedure that involves taking X-rays of your rectum after a dye (contrast material) is injected into the rectum (defecogram). How is this treated? This condition is usually treated with surgery to repair the weakened muscles and to reconnect the rectum to attachments inside the lower abdomen. Other treatment options may include:  Pushing the prolapsed area back into the rectum (reduction). ? Your health care provider may do this by gently pushing it back in using a moist cloth. ? The health care provider may show you how to do this at home if the prolapse occurs again.  Medicines to prevent constipation and straining. This may include laxatives or stool softeners. Follow these instructions at home: General instructions   Take over-the-counter and prescription medicines only as told by your health care provider.  Do not strain to have a bowel movement.  Do not lift anything that is heavier than 10 lb (4.5 kg), or the limit that you are told, until your health care  provider says that it is safe.  Follow instructions from your health care provider about what to do if the prolapse occurs again and does not go back in. This may involve lying on your side and using a moist cloth to gently press the lump into your rectum.  Keep all follow-up visits as told by your health care provider. This is important. Preventing constipation To prevent or treat constipation, your health care provider may recommend that you:  Drink enough fluid to keep your  urine pale yellow.  Take over-the-counter or prescription medicines.  Eat foods that are high in fiber, such as beans, whole grains, and fresh fruits and vegetables.  Limit foods that are high in fat and processed sugars, such as fried or sweet foods.  Contact a health care provider if:  You have a fever.  Your prolapse cannot be reduced at home.  You have constipation or diarrhea.  You have mild rectal bleeding. Get help right away if:  You have very bad rectal pain.  You bleed heavily from your rectum. Summary  Rectal prolapse happens when the inside of the rectum drops down into an abnormal position. In some cases, the rectum may partially or completely push out through the anal opening.  This condition is usually treated with surgery to repair the weakened muscles and to reconnect the rectum to attachments inside the lower abdomen.  Take over-the-counter and prescription medicines only as told by your health care provider.  Follow instructions from your health care provider about what to do if the prolapse occurs again and does not go back in.  Get help right away if you have very bad rectal pain or if you bleed heavily from your rectum. This information is not intended to replace advice given to you by your health care provider. Make sure you discuss any questions you have with your health care provider. Document Released: 01/19/2015 Document Revised: 11/06/2017 Document Reviewed: 11/06/2017 Elsevier Interactive Patient Education  2019 [employer].

## 2018-09-29 DIAGNOSIS — K649 Unspecified hemorrhoids: Secondary | ICD-10-CM | POA: Diagnosis not present

## 2018-09-30 ENCOUNTER — Telehealth: Payer: Self-pay

## 2018-09-30 NOTE — Telephone Encounter (Signed)
Copied from [place] [phone]. Topic: General - Other >> Sep 30, 2018 10:15 AM Mathis Bud wrote: Reason for CRM: Baker Janus from [facility] called stating needs patients EKG faxed over to see if patient would need one when coming in for prescreening for surgery.  Last EKG was done 01/17/18. Gail call back # [phone] Fax [phone]  Faxed over EKG to [facility].  Mckayla Mulcahey,cma

## 2018-10-01 DIAGNOSIS — Z1159 Encounter for screening for other viral diseases: Secondary | ICD-10-CM | POA: Diagnosis not present

## 2018-10-01 DIAGNOSIS — Z01818 Encounter for other preprocedural examination: Secondary | ICD-10-CM | POA: Diagnosis not present

## 2018-10-02 ENCOUNTER — Telehealth: Payer: Self-pay | Admitting: Internal Medicine

## 2018-10-02 NOTE — Telephone Encounter (Signed)
COVID negative 10/01/2018  [place]

## 2018-10-03 ENCOUNTER — Encounter: Payer: Self-pay | Admitting: Internal Medicine

## 2018-10-03 DIAGNOSIS — Z86010 Personal history of colonic polyps: Secondary | ICD-10-CM | POA: Diagnosis not present

## 2018-10-03 DIAGNOSIS — K649 Unspecified hemorrhoids: Secondary | ICD-10-CM | POA: Diagnosis not present

## 2018-10-03 DIAGNOSIS — Z91040 Latex allergy status: Secondary | ICD-10-CM | POA: Diagnosis not present

## 2018-10-03 DIAGNOSIS — E119 Type 2 diabetes mellitus without complications: Secondary | ICD-10-CM | POA: Diagnosis not present

## 2018-10-03 DIAGNOSIS — K219 Gastro-esophageal reflux disease without esophagitis: Secondary | ICD-10-CM | POA: Diagnosis not present

## 2018-10-03 DIAGNOSIS — K644 Residual hemorrhoidal skin tags: Secondary | ICD-10-CM | POA: Diagnosis not present

## 2018-10-03 DIAGNOSIS — K648 Other hemorrhoids: Secondary | ICD-10-CM | POA: Diagnosis not present

## 2018-10-03 DIAGNOSIS — I10 Essential (primary) hypertension: Secondary | ICD-10-CM | POA: Diagnosis not present

## 2018-10-06 ENCOUNTER — Encounter: Payer: Self-pay | Admitting: Internal Medicine

## 2018-10-07 ENCOUNTER — Other Ambulatory Visit: Payer: Self-pay | Admitting: Internal Medicine

## 2018-10-07 ENCOUNTER — Other Ambulatory Visit: Payer: Medicare Other

## 2018-10-07 ENCOUNTER — Encounter: Payer: Self-pay | Admitting: Internal Medicine

## 2018-10-07 ENCOUNTER — Telehealth: Payer: Self-pay | Admitting: Internal Medicine

## 2018-10-07 DIAGNOSIS — K649 Unspecified hemorrhoids: Secondary | ICD-10-CM | POA: Diagnosis not present

## 2018-10-07 DIAGNOSIS — I10 Essential (primary) hypertension: Secondary | ICD-10-CM

## 2018-10-07 DIAGNOSIS — N3000 Acute cystitis without hematuria: Secondary | ICD-10-CM

## 2018-10-07 MED ORDER — HYDROCHLOROTHIAZIDE 12.5 MG PO TABS: 12.5000 mg | ORAL_TABLET | Freq: Every day | ORAL | 3 refills | Status: DC

## 2018-10-07 MED ORDER — CIPROFLOXACIN HCL 500 MG PO TABS: 500.0000 mg | ORAL_TABLET | Freq: Two times a day (BID) | ORAL | 0 refills | Status: DC

## 2018-10-07 MED ORDER — LOSARTAN POTASSIUM 50 MG PO TABS: 50.0000 mg | ORAL_TABLET | Freq: Every day | ORAL | 3 refills | Status: DC

## 2018-10-07 NOTE — Telephone Encounter (Unsigned)
Copied from [place] [id number]. Topic: Quick Communication - Rx Refill/Question >> Oct 07, 2018  9:16 AM Alanda Slim E wrote: Medication: Pt has had surgery(hemorrhoidectomy) and her UTI has come back and she is in severe need of the same anti biotic she was prescribed after her 4.28.20 appt. Pt doesn't know the medication name but states it was a blue and green pill and she also wants to know if it can be an 8-10 day supply because she is really sick and already is struggling with the UTI due to surgery. / please advise

## 2018-10-07 NOTE — Telephone Encounter (Signed)
Patient placed on schedule for UA @ 1500

## 2018-10-07 NOTE — Telephone Encounter (Signed)
Please place pt on lab schedule for urine today 10/07/2018   Thanks [place]

## 2018-10-08 ENCOUNTER — Other Ambulatory Visit: Payer: Self-pay

## 2018-10-08 ENCOUNTER — Other Ambulatory Visit: Payer: Medicare Other

## 2018-10-08 DIAGNOSIS — N3000 Acute cystitis without hematuria: Secondary | ICD-10-CM | POA: Diagnosis not present

## 2018-10-09 LAB — URINALYSIS, ROUTINE W REFLEX MICROSCOPIC
Bacteria, UA: NONE SEEN /HPF
Bilirubin Urine: NEGATIVE
Glucose, UA: NEGATIVE
Hgb urine dipstick: NEGATIVE
Hyaline Cast: NONE SEEN /LPF
Ketones, ur: NEGATIVE
Nitrite: NEGATIVE
Protein, ur: NEGATIVE
Specific Gravity, Urine: 1.014 (ref 1.001–1.03)
Squamous Epithelial / LPF: NONE SEEN /HPF (ref <=5)
pH: 6 (ref 5.0–8.0)

## 2018-10-09 LAB — URINE CULTURE
MICRO NUMBER:: 509880
Result:: NO GROWTH
SPECIMEN QUALITY:: ADEQUATE

## 2018-10-20 ENCOUNTER — Other Ambulatory Visit: Payer: Self-pay

## 2018-10-20 ENCOUNTER — Other Ambulatory Visit (INDEPENDENT_AMBULATORY_CARE_PROVIDER_SITE_OTHER): Payer: Medicare Other

## 2018-10-20 DIAGNOSIS — N3000 Acute cystitis without hematuria: Secondary | ICD-10-CM | POA: Diagnosis not present

## 2018-10-20 DIAGNOSIS — Z13220 Encounter for screening for lipoid disorders: Secondary | ICD-10-CM | POA: Diagnosis not present

## 2018-10-20 DIAGNOSIS — I10 Essential (primary) hypertension: Secondary | ICD-10-CM | POA: Diagnosis not present

## 2018-10-20 DIAGNOSIS — E559 Vitamin D deficiency, unspecified: Secondary | ICD-10-CM

## 2018-10-20 LAB — CBC WITH DIFFERENTIAL/PLATELET
Basophils Absolute: 0 10*3/uL (ref 0.0–0.1)
Basophils Relative: 0.2 % (ref 0.0–3.0)
Eosinophils Absolute: 0.4 10*3/uL (ref 0.0–0.7)
Eosinophils Relative: 6.3 % — ABNORMAL HIGH (ref 0.0–5.0)
HCT: 39.7 % (ref 36.0–46.0)
Hemoglobin: 13.5 g/dL (ref 12.0–15.0)
Lymphocytes Relative: 19.8 % (ref 12.0–46.0)
Lymphs Abs: 1.1 10*3/uL (ref 0.7–4.0)
MCHC: 33.9 g/dL (ref 30.0–36.0)
MCV: 97.9 fl (ref 78.0–100.0)
Monocytes Absolute: 0.5 10*3/uL (ref 0.1–1.0)
Monocytes Relative: 9.6 % (ref 3.0–12.0)
Neutro Abs: 3.6 10*3/uL (ref 1.4–7.7)
Neutrophils Relative %: 64.1 % (ref 43.0–77.0)
Platelets: 255 10*3/uL (ref 150.0–400.0)
RBC: 4.05 Mil/uL (ref 3.87–5.11)
RDW: 14.1 % (ref 11.5–15.5)
WBC: 5.7 10*3/uL (ref 4.0–10.5)

## 2018-10-20 LAB — COMPREHENSIVE METABOLIC PANEL
ALT: 13 U/L (ref 0–35)
AST: 15 U/L (ref 0–37)
Albumin: 4.1 g/dL (ref 3.5–5.2)
Alkaline Phosphatase: 57 U/L (ref 39–117)
BUN: 14 mg/dL (ref 6–23)
CO2: 28 mEq/L (ref 19–32)
Calcium: 10 mg/dL (ref 8.4–10.5)
Chloride: 106 mEq/L (ref 96–112)
Creatinine, Ser: 0.57 mg/dL (ref 0.40–1.20)
GFR: 105.87 mL/min (ref >60.00)
Glucose, Bld: 103 mg/dL — ABNORMAL HIGH (ref 70–99)
Potassium: 4 mEq/L (ref 3.5–5.1)
Sodium: 141 mEq/L (ref 135–145)
Total Bilirubin: 0.4 mg/dL (ref 0.2–1.2)
Total Protein: 6.3 g/dL (ref 6.0–8.3)

## 2018-10-20 LAB — LIPID PANEL
Cholesterol: 201 mg/dL — ABNORMAL HIGH (ref 0–200)
HDL: 71 mg/dL (ref >39.00)
LDL Cholesterol: 115 mg/dL — ABNORMAL HIGH (ref 0–99)
NonHDL: 130.38
Total CHOL/HDL Ratio: 3
Triglycerides: 77 mg/dL (ref 0.0–149.0)
VLDL: 15.4 mg/dL (ref 0.0–40.0)

## 2018-10-20 LAB — VITAMIN D 25 HYDROXY (VIT D DEFICIENCY, FRACTURES): VITD: 33.43 ng/mL (ref 30.00–100.00)

## 2018-10-20 LAB — TSH: TSH: 2.13 u[IU]/mL (ref 0.35–4.50)

## 2018-10-22 LAB — URINE CULTURE
MICRO NUMBER:: 546501
Result:: NO GROWTH
SPECIMEN QUALITY:: ADEQUATE

## 2018-10-22 LAB — URINALYSIS, ROUTINE W REFLEX MICROSCOPIC
Bilirubin Urine: NEGATIVE
Glucose, UA: NEGATIVE
Hgb urine dipstick: NEGATIVE
Ketones, ur: NEGATIVE
Leukocytes,Ua: NEGATIVE
Nitrite: NEGATIVE
Protein, ur: NEGATIVE
Specific Gravity, Urine: 1.015 (ref 1.001–1.03)
pH: 6.5 (ref 5.0–8.0)

## 2018-11-12 ENCOUNTER — Ambulatory Visit: Payer: Medicare Other | Admitting: Internal Medicine

## 2018-12-05 ENCOUNTER — Other Ambulatory Visit: Payer: Self-pay

## 2018-12-05 ENCOUNTER — Encounter: Payer: Self-pay | Admitting: Internal Medicine

## 2018-12-05 ENCOUNTER — Ambulatory Visit (INDEPENDENT_AMBULATORY_CARE_PROVIDER_SITE_OTHER): Payer: Medicare Other | Admitting: Internal Medicine

## 2018-12-05 DIAGNOSIS — K648 Other hemorrhoids: Secondary | ICD-10-CM | POA: Diagnosis not present

## 2018-12-05 DIAGNOSIS — E785 Hyperlipidemia, unspecified: Secondary | ICD-10-CM | POA: Diagnosis not present

## 2018-12-05 DIAGNOSIS — I10 Essential (primary) hypertension: Secondary | ICD-10-CM

## 2018-12-05 DIAGNOSIS — Z1231 Encounter for screening mammogram for malignant neoplasm of breast: Secondary | ICD-10-CM

## 2018-12-05 NOTE — Progress Notes (Signed)
Virtual Visit via Video Note  I connected with Desiree Pittman   on 12/05/18 at 11:26 AM EDT by a video enabled telemedicine application and verified that I am speaking with the correct person using two identifiers.  Location patient: home Location provider:work  Persons participating in the virtual visit: patient, provider  I discussed the limitations of evaluation and management by telemedicine and the availability of in person appointments. The patient expressed understanding and agreed to proceed.   HPI: 1. HTN controlled 11/03/18 BP was 109/61 on hctz 12.5 mg qd, losartan 50 mg qd, procardiac 30 mg xl insurance will no longer cover procardia and expensive and she wants to stop since BP running low since she is less stressed due to retirement 10/11/2018 2. HLD she will try exercise and healthy diet choices TC 201, LDL 115 3. Stage 4 hemorrhoids with rectal pain leakage and prolapse s/p repair Dr. Dessa Phi doing better    ROS: See pertinent positives and negatives per HPI.  Past Medical History:  Diagnosis Date  . Abnormal Pap smear of cervix   . Allergy   . Anxiety   . Arthritis   . Blood in stool    colonoscopy 02/2017  . Chicken pox   . Colon polyps   . Depression   . Diabetes mellitus without complication ([place])    type 2  . Diverticulitis   . GERD (gastroesophageal reflux disease)    h/o ulcers ? GI   . Glaucoma    borderline   . Heart murmur   . Hyperlipidemia   . Hypertension   . PONV (postoperative nausea and vomiting)    pull tubes out,etc.    Past Surgical History:  Procedure Laterality Date  . ANAL RECTAL MANOMETRY N/A 03/08/2017   Procedure: ANO RECTAL MANOMETRY;  Surgeon: Ileana Roup, MD;  [facility]: [facility];  Service: General;  Laterality: N/A;  . CARPAL TUNNEL RELEASE    . COLONOSCOPY WITH PROPOFOL N/A 03/07/2017   Procedure: COLONOSCOPY WITH PROPOFOL;  Surgeon: Ileana Roup, MD;  [facility]: [facility];  Service: General;   Laterality: N/A;  . finergy surgery     cut finger sewn back on  . HAND SURGERY     ring finger left hand   . HEMORRHOID SURGERY     10/03/18 exc of mixed int/ext hemorrhoids x 3 columns Dr. Audie Clear  . JOINT REPLACEMENT     left  hip 2005, 2010, right hip 2010   . TOTAL HIP ARTHROPLASTY  04/23/2012   Procedure: TOTAL HIP ARTHROPLASTY ANTERIOR APPROACH;  Surgeon: Gearlean Alf, MD;  [facility]: [facility];  Service: Orthopedics;  Laterality: Right;  . TOTAL HIP REVISION  04/23/2012   Procedure: TOTAL HIP REVISION;  Surgeon: Gearlean Alf, MD;  Location: [facility];  Service: Orthopedics;  Laterality: Left;  . TOTAL KNEE ARTHROPLASTY Left 02/17/2018   Procedure: LEFT TOTAL KNEE ARTHROPLASTY;  Surgeon: Gaynelle Arabian, MD;  [facility]: [facility];  Service: Orthopedics;  Laterality: Left;    Family History  Problem Relation Age of Onset  . Hypertension Mother   . Arthritis Mother   . Heart disease Mother   . Cancer Father        lung died age 88   . Alcohol abuse Son   . Cancer Other        colon caner     SOCIAL HX:  Doing well since retirement 10/11/2018  Lives with husband and animals    Current Outpatient Medications:  .  Biotin 5000 MCG TABS, Take 5,000 mcg by mouth daily., Disp: , Rfl:  .  cetirizine (ZYRTEC) 10 MG tablet, Take 10 mg by mouth daily., Disp: , Rfl:  .  Cholecalciferol (VITAMIN D3 PO), Take by mouth. 4000 IU gummies, Disp: , Rfl:  .  co-enzyme Q-10 30 MG capsule, Take 30 mg by mouth daily., Disp: , Rfl:  .  hydrochlorothiazide (HYDRODIURIL) 12.5 MG tablet, Take 1 tablet (12.5 mg total) by mouth daily., Disp: 90 tablet, Rfl: 3 .  hydrocortisone (ANUSOL-HC) 2.5 % rectal cream, Place 1 application rectally 2 (two) times daily., Disp: 30 g, Rfl: 1 .  lactobacillus acidophilus (BACID) TABS tablet, Take 2 tablets by mouth daily., Disp: , Rfl:  .  losartan (COZAAR) 50 MG tablet, Take 1 tablet (50 mg total) by mouth daily., Disp: 90 tablet, Rfl: 3  EXAM:  VITALS per patient if  applicable:  GENERAL: alert, oriented, appears well and in no acute distress  HEENT: atraumatic, conjunttiva clear, no obvious abnormalities on inspection of external nose and ears  NECK: normal movements of the head and neck  LUNGS: on inspection no signs of respiratory distress, breathing rate appears normal, no obvious gross SOB, gasping or wheezing  CV: no obvious cyanosis  MS: moves all visible extremities without noticeable abnormality  PSYCH/NEURO: pleasant and cooperative, no obvious depression or anxiety, speech and thought processing grossly intact  ASSESSMENT AND PLAN:  Discussed the following assessment and plan:  Essential hypertension - Plan: cont losartan 50 mg qd and hctz 12.5 mg qd  Stop nifedipine xl 30 mg qd  Monitor BP   Hyperlipidemia, unspecified hyperlipidemia type - Plan:  rec healthy diet and exercise   Hemorrhoid prolapse stage 4 s/p repair Dr. Dessa Phi- Plan: doing well no further rectal pain/bleeding/stool leakage   HM Flu shot pt will get Tdap2/11/19 prevnar 6/5/19pna 23 in 1 year  Disc shingrix per pt had old shingrix vaccine   [facility]. -[facility]16   mammoabnl 06/05/17 repeat neg 2/1/19due 2/1/2020ordered pt needs to schedule [facility]  -referred again   Colonoscopy had 03/07/17 reviewed 2 mm polpy diverticulahyperplastic, Ext and Int Hemorrhoids Dr. Annye English f/u in 5 years   rec alcohol cessationshe has cut back congratulated  Declines STD testing   Consider DEXA in future disc with pt today 12/05/18 and declines and would declines bisphosphonates if needed in future   referred dermatology Dr. Delman Cheadle in [place] pt saw in 2019  F/u in 6 months       I discussed the assessment and treatment plan with the patient. The patient was provided an opportunity to ask questions and all were answered. The patient agreed with the plan and demonstrated an understanding of  the instructions.   The patient was advised to call back or seek an in-person evaluation if the symptoms worsen or if the condition fails to improve as anticipated.  Time spent 25 minutes  Delorise Jackson, MD

## 2018-12-12 ENCOUNTER — Other Ambulatory Visit: Payer: Self-pay

## 2018-12-12 ENCOUNTER — Ambulatory Visit
Admission: RE | Admit: 2018-12-12 | Discharge: 2018-12-12 | Disposition: A | Discharge status: Home / Self Care | Payer: Medicare Other | Source: Ambulatory Visit | Attending: Internal Medicine | Admitting: Internal Medicine

## 2018-12-12 DIAGNOSIS — Z1231 Encounter for screening mammogram for malignant neoplasm of breast: Secondary | ICD-10-CM | POA: Diagnosis not present

## 2018-12-23 DIAGNOSIS — L239 Allergic contact dermatitis, unspecified cause: Secondary | ICD-10-CM | POA: Diagnosis not present

## 2019-01-14 DIAGNOSIS — Z23 Encounter for immunization: Secondary | ICD-10-CM | POA: Diagnosis not present

## 2019-02-24 DIAGNOSIS — Z23 Encounter for immunization: Secondary | ICD-10-CM | POA: Diagnosis not present

## 2019-02-24 DIAGNOSIS — S30861S Insect bite (nonvenomous) of abdominal wall, sequela: Secondary | ICD-10-CM | POA: Diagnosis not present

## 2019-03-09 DIAGNOSIS — D225 Melanocytic nevi of trunk: Secondary | ICD-10-CM | POA: Diagnosis not present

## 2019-03-09 DIAGNOSIS — L821 Other seborrheic keratosis: Secondary | ICD-10-CM | POA: Diagnosis not present

## 2019-03-09 DIAGNOSIS — L814 Other melanin hyperpigmentation: Secondary | ICD-10-CM | POA: Diagnosis not present

## 2019-03-09 DIAGNOSIS — Z23 Encounter for immunization: Secondary | ICD-10-CM | POA: Diagnosis not present

## 2019-03-09 DIAGNOSIS — L738 Other specified follicular disorders: Secondary | ICD-10-CM | POA: Diagnosis not present

## 2019-06-09 ENCOUNTER — Telehealth: Payer: Self-pay | Admitting: Internal Medicine

## 2019-06-09 NOTE — Telephone Encounter (Signed)
l called pt twice and left a vm to call ofc.

## 2019-06-11 ENCOUNTER — Ambulatory Visit (INDEPENDENT_AMBULATORY_CARE_PROVIDER_SITE_OTHER): Payer: Medicare Other | Admitting: Internal Medicine

## 2019-06-11 ENCOUNTER — Other Ambulatory Visit: Payer: Self-pay

## 2019-06-11 ENCOUNTER — Encounter: Payer: Self-pay | Admitting: Internal Medicine

## 2019-06-11 VITALS — Ht 62.0 in | Wt 125.0 lb

## 2019-06-11 DIAGNOSIS — E785 Hyperlipidemia, unspecified: Secondary | ICD-10-CM | POA: Diagnosis not present

## 2019-06-11 DIAGNOSIS — I10 Essential (primary) hypertension: Secondary | ICD-10-CM | POA: Diagnosis not present

## 2019-06-11 DIAGNOSIS — W19XXXD Unspecified fall, subsequent encounter: Secondary | ICD-10-CM

## 2019-06-11 DIAGNOSIS — E559 Vitamin D deficiency, unspecified: Secondary | ICD-10-CM | POA: Diagnosis not present

## 2019-06-11 NOTE — Progress Notes (Signed)
Virtual Visit via Video Note  I connected with Desiree Pittman  on 06/11/19 at 11:20 AM EST by a video enabled telemedicine application and verified that I am speaking with the correct person using two identifiers.  Location patient: home Location provider:work or home office Persons participating in the virtual visit: patient, provider, pts husband   I discussed the limitations of evaluation and management by telemedicine and the availability of in person appointments. The patient expressed understanding and agreed to proceed.   HPI: 1. HTN not checked BP on hctz 12.5 mg qd and losartan 50 mg qd denies h/a  2. Had fall s/p replacement of hip and knee down a handicap ramp rushing to get MIL to doctor 2 months ago left hip and knee were initially sore but resolved  3. Chigger bites summer 2020 saw dermatology given multiple tx's and finally resolved   ROS: See pertinent positives and negatives per HPI. CV: no chest pain  Lungs: no sob   Past Medical History:  Diagnosis Date  . Abnormal Pap smear of cervix   . Allergy   . Anxiety   . Arthritis   . Blood in stool    colonoscopy 02/2017  . Chicken pox   . Colon polyps   . Depression   . Diabetes mellitus without complication ([place])    type 2  . Diverticulitis   . GERD (gastroesophageal reflux disease)    h/o ulcers ? GI   . Glaucoma    borderline   . Heart murmur   . Hyperlipidemia   . Hypertension   . PONV (postoperative nausea and vomiting)    pull tubes out,etc.    Past Surgical History:  Procedure Laterality Date  . ANAL RECTAL MANOMETRY N/A 03/08/2017   Procedure: ANO RECTAL MANOMETRY;  Surgeon: Ileana Roup, MD;  [facility];  Service: General;  Laterality: N/A;  . CARPAL TUNNEL RELEASE    . COLONOSCOPY WITH PROPOFOL N/A 03/07/2017   Procedure: COLONOSCOPY WITH PROPOFOL;  Surgeon: Ileana Roup, MD;  [facility];  Service: General;  Laterality: N/A;  . finergy surgery     cut  finger sewn back on  . HAND SURGERY     ring finger left hand   . HEMORRHOID SURGERY     10/03/18 exc of mixed int/ext hemorrhoids x 3 columns Dr. Audie Clear  . JOINT REPLACEMENT     left  hip 2005, 2010, right hip Aug 10, 2008   . TOTAL HIP ARTHROPLASTY  04/23/2012   Procedure: TOTAL HIP ARTHROPLASTY ANTERIOR APPROACH;  Surgeon: Gearlean Alf, MD;  [facility];  Service: Orthopedics;  Laterality: Right;  . TOTAL HIP REVISION  04/23/2012   Procedure: TOTAL HIP REVISION;  Surgeon: Gearlean Alf, MD;  [facility];  Service: Orthopedics;  Laterality: Left;  . TOTAL KNEE ARTHROPLASTY Left 02/17/2018   Procedure: LEFT TOTAL KNEE ARTHROPLASTY;  Surgeon: Gaynelle Arabian, MD;  [facility];  Service: Orthopedics;  Laterality: Left;    Family History  Problem Relation Age of Onset  . Hypertension Mother   . Arthritis Mother   . Heart disease Mother   . Cancer Father        lung died age 42   . Alcohol abuse Son   . Cancer Other        colon caner     SOCIAL HX:  Lives with her husband. 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  Biotin 5000 MCG TABS, Take 5,000 mcg by mouth daily., Disp: , Rfl:  .  cetirizine (ZYRTEC) 10 MG tablet, Take 10 mg by mouth daily., Disp: , Rfl:  .  Cholecalciferol (VITAMIN D3 PO), Take by mouth. 4000 IU gummies, Disp: , Rfl:  .  co-enzyme Q-10 30 MG capsule, Take 30 mg by mouth daily., Disp: , Rfl:  .  hydrochlorothiazide (HYDRODIURIL) 12.5 MG tablet, Take 1 tablet (12.5 mg total) by mouth daily., Disp: 90 tablet, Rfl: 3 .  hydrocortisone (ANUSOL-HC) 2.5 % rectal cream, Place 1 application rectally 2 (two) times daily., Disp: 30 g, Rfl: 1 .  lactobacillus acidophilus (BACID) TABS tablet, Take 2 tablets  by mouth daily., Disp: , Rfl:  .  losartan (COZAAR) 50 MG tablet, Take 1 tablet (50 mg total) by mouth daily., Disp: 90 tablet, Rfl: 3  EXAM:  VITALS per patient if applicable:  GENERAL: alert, oriented, appears well and in no acute distress  HEENT: atraumatic, conjunttiva clear, no obvious abnormalities on inspection of external nose and ears  NECK: normal movements of the head and neck  LUNGS: on inspection no signs of respiratory distress, breathing rate appears normal, no obvious gross SOB, gasping or wheezing  CV: no obvious cyanosis  MS: moves all visible extremities without noticeable abnormality  PSYCH/NEURO: pleasant and cooperative, no obvious depression or anxiety, speech and thought processing grossly intact  ASSESSMENT AND PLAN:  Discussed the following assessment and plan:  Essential hypertension Cont hctz 12.5 mg qd, losartan 50 mg qd   Hyperlipidemia, unspecified hyperlipidemia type Check fasting labs   Fall, mechanical doing well since fall 2 months ago   HM Flu shot had dermatology office in 02/2019 or 03/2019  Tdap2/11/19 prevnar 10/16/17 pna 23 utd Disc shingrix per pt had old shingrix vaccine   [facility]. -[facility] 07[facility]29/16   mammoabnl 06/05/17 repeat neg 06/14/17 [facility]  mammo repeat 12/12/18 neg   Colonoscopy had 03/07/17 reviewed 2 mm polpy diverticulahyperplastic, Ext and Int Hemorrhoids Dr. Annye English f/u in 5 years   rec alcohol cessationshe has cut back congratulatedpreviously   Declines STD testing   Consider DEXA in future disc with pt today 12/05/18 and declined and would declines bisphosphonates if needed in future   referred dermatology Dr. Delman Cheadle in [place] pt saw in 2019 also summer 2020 and 10 or 03/2019 tbse normal w/o bxs   -we discussed possible serious and likely etiologies, options for evaluation and workup, limitations of telemedicine visit vs in person visit,  treatment, treatment risks and precautions. Pt prefers to treat via telemedicine empirically rather then risking or undertaking an in person visit at this moment. Patient agrees to seek prompt in person care if worsening, new symptoms arise, or if is not improving with treatment.   I discussed the assessment and treatment plan with the patient. The patient was provided an opportunity to ask questions and all were answered. The patient agreed with the plan and demonstrated an understanding of the instructions.   The patient was advised to call back or seek an in-person evaluation if the symptoms worsen or if the condition fails to improve as anticipated.  Time spent 20 minutes  Delorise Jackson, MD

## 2019-06-11 NOTE — Patient Instructions (Signed)
Consider shingrix vaccine 2 doses at your pharmacy if you want this let me know and space vaccines I.e covid and shingles vaccine out by 1 month   COVID-19 Vaccine Information can be found at: ShippingScam.co.uk For questions related to vaccine distribution or appointments, please email [email or web address] or call [phone].   Where to get covid vaccine  [facility] mid feb/early March will have some  [facility]  Call phone # [phone] .com type in covid 19 vaccine  Also [place] [facility]   Zoster Vaccine, Recombinant injection What is this medicine? ZOSTER VACCINE (ZOS ter vak SEEN) is used to prevent shingles in adults 68 years old and over. This vaccine is not used to treat shingles or nerve pain from shingles. This medicine may be used for other purposes; ask your health care provider or pharmacist if you have questions. COMMON BRAND NAME(S): [facility] What should I tell my health care provider before I take this medicine? They need to know if you have any of these conditions:  blood disorders or disease  cancer like leukemia or lymphoma  immune system problems or therapy  an unusual or allergic reaction to vaccines, other medications, foods, dyes, or preservatives  pregnant or trying to get pregnant  breast-feeding How should I use this medicine? This vaccine is for injection in a muscle. It is given by a health care professional. Talk to your pediatrician regarding the use of this medicine in children. This medicine is not approved for use in children. Overdosage: If you think you have taken too much of this medicine contact a poison control center or emergency room at once. NOTE: This medicine is only for you. Do not share this medicine with others. What if I miss a dose? Keep appointments for follow-up (booster) doses as directed. It is important not to miss  your dose. Call your doctor or health care professional if you are unable to keep an appointment. What may interact with this medicine?  medicines that suppress your immune system  medicines to treat cancer  steroid medicines like prednisone or cortisone This list may not describe all possible interactions. Give your health care provider a list of all the medicines, herbs, non-prescription drugs, or dietary supplements you use. Also tell them if you smoke, drink alcohol, or use illegal drugs. Some items may interact with your medicine. What should I watch for while using this medicine? Visit your doctor for regular check ups. This vaccine, like all vaccines, may not fully protect everyone. What side effects may I notice from receiving this medicine? Side effects that you should report to your doctor or health care professional as soon as possible:  allergic reactions like skin rash, itching or hives, swelling of the face, lips, or tongue  breathing problems Side effects that usually do not require medical attention (report these to your doctor or health care professional if they continue or are bothersome):  chills  headache  fever  nausea, vomiting  redness, warmth, pain, swelling or itching at site where injected  tiredness This list may not describe all possible side effects. Call your doctor for medical advice about side effects. You may report side effects to FDA at 1-800-FDA-1088. Where should I keep my medicine? This vaccine is only given in a clinic, pharmacy, doctor's office, or other health care setting and will not be stored at home. NOTE: This sheet is a summary. It may not cover all possible information. If you have questions about this  medicine, talk to your doctor, pharmacist, or health care provider.  2020 Elsevier/Gold Standard (2016-12-10 13:20:30)

## 2019-06-17 ENCOUNTER — Other Ambulatory Visit: Payer: Self-pay

## 2019-06-17 ENCOUNTER — Other Ambulatory Visit (INDEPENDENT_AMBULATORY_CARE_PROVIDER_SITE_OTHER): Payer: Medicare Other

## 2019-06-17 DIAGNOSIS — I10 Essential (primary) hypertension: Secondary | ICD-10-CM | POA: Diagnosis not present

## 2019-06-17 DIAGNOSIS — E559 Vitamin D deficiency, unspecified: Secondary | ICD-10-CM

## 2019-06-17 DIAGNOSIS — E785 Hyperlipidemia, unspecified: Secondary | ICD-10-CM

## 2019-06-17 LAB — COMPREHENSIVE METABOLIC PANEL
ALT: 15 U/L (ref 0–35)
AST: 20 U/L (ref 0–37)
Albumin: 4.2 g/dL (ref 3.5–5.2)
Alkaline Phosphatase: 62 U/L (ref 39–117)
BUN: 12 mg/dL (ref 6–23)
CO2: 29 mEq/L (ref 19–32)
Calcium: 10.2 mg/dL (ref 8.4–10.5)
Chloride: 103 mEq/L (ref 96–112)
Creatinine, Ser: 0.63 mg/dL (ref 0.40–1.20)
GFR: 94.14 mL/min (ref >60.00)
Glucose, Bld: 91 mg/dL (ref 70–99)
Potassium: 4.4 mEq/L (ref 3.5–5.1)
Sodium: 138 mEq/L (ref 135–145)
Total Bilirubin: 0.6 mg/dL (ref 0.2–1.2)
Total Protein: 6.4 g/dL (ref 6.0–8.3)

## 2019-06-17 LAB — CBC WITH DIFFERENTIAL/PLATELET
Basophils Absolute: 0 10*3/uL (ref 0.0–0.1)
Basophils Relative: 0.4 % (ref 0.0–3.0)
Eosinophils Absolute: 0.2 10*3/uL (ref 0.0–0.7)
Eosinophils Relative: 3.6 % (ref 0.0–5.0)
HCT: 44.7 % (ref 36.0–46.0)
Hemoglobin: 15 g/dL (ref 12.0–15.0)
Lymphocytes Relative: 25 % (ref 12.0–46.0)
Lymphs Abs: 1.1 10*3/uL (ref 0.7–4.0)
MCHC: 33.6 g/dL (ref 30.0–36.0)
MCV: 101.3 fl — ABNORMAL HIGH (ref 78.0–100.0)
Monocytes Absolute: 0.5 10*3/uL (ref 0.1–1.0)
Monocytes Relative: 11.5 % (ref 3.0–12.0)
Neutro Abs: 2.7 10*3/uL (ref 1.4–7.7)
Neutrophils Relative %: 59.5 % (ref 43.0–77.0)
Platelets: 222 10*3/uL (ref 150.0–400.0)
RBC: 4.41 Mil/uL (ref 3.87–5.11)
RDW: 13.5 % (ref 11.5–15.5)
WBC: 4.6 10*3/uL (ref 4.0–10.5)

## 2019-06-17 LAB — LIPID PANEL
Cholesterol: 218 mg/dL — ABNORMAL HIGH (ref 0–200)
HDL: 78 mg/dL (ref >39.00)
LDL Cholesterol: 117 mg/dL — ABNORMAL HIGH (ref 0–99)
NonHDL: 139.78
Total CHOL/HDL Ratio: 3
Triglycerides: 116 mg/dL (ref 0.0–149.0)
VLDL: 23.2 mg/dL (ref 0.0–40.0)

## 2019-06-17 LAB — VITAMIN D 25 HYDROXY (VIT D DEFICIENCY, FRACTURES): VITD: 24.44 ng/mL — ABNORMAL LOW (ref 30.00–100.00)

## 2019-06-18 ENCOUNTER — Encounter: Payer: Self-pay | Admitting: Internal Medicine

## 2019-06-18 ENCOUNTER — Telehealth: Payer: Self-pay | Admitting: *Deleted

## 2019-06-18 ENCOUNTER — Telehealth: Payer: Self-pay | Admitting: Lab

## 2019-06-18 NOTE — Telephone Encounter (Signed)
Pt wanted to let you know that her husband finally bought her a bp machine off [employer] and she loves it.  Below is a copy of her BP readings:  06/13/19 @ 10am: 135/73 130/21 @ 1:30pm: 128/71 06/14/19 @ noon: 147/83 06/14/19 @ pm: 152/81 06/15/19 @ 10am: 148/82 06/14/10 @ pm: 131/74 06/16/19 @ noon: 137/78 06/16/19 @ pm: 140/80

## 2019-06-18 NOTE — Telephone Encounter (Signed)
Called Pt home number and was told she was out of town, called her cell phone number No answer left a VM to call the office when she receive this message.

## 2019-06-18 NOTE — Telephone Encounter (Signed)
Called Pt with results, was told Pt was out of town. Family member stated he would tell the Pt to call our office when she returns.

## 2019-06-18 NOTE — Telephone Encounter (Signed)
Thank you goal blood pressure <130/<80  Is she agreeble to increase losartan to 100 mg daily?   [place]

## 2019-11-11 ENCOUNTER — Other Ambulatory Visit: Payer: Self-pay | Admitting: Internal Medicine

## 2019-11-11 DIAGNOSIS — I10 Essential (primary) hypertension: Secondary | ICD-10-CM

## 2019-11-11 MED ORDER — HYDROCHLOROTHIAZIDE 12.5 MG PO TABS: 12.5000 mg | ORAL_TABLET | Freq: Every day | ORAL | 2 refills | Status: DC

## 2019-11-11 MED ORDER — LOSARTAN POTASSIUM 50 MG PO TABS: 50.0000 mg | ORAL_TABLET | Freq: Every day | ORAL | 2 refills | Status: DC

## 2019-12-09 ENCOUNTER — Encounter: Payer: Self-pay | Admitting: Internal Medicine

## 2019-12-09 ENCOUNTER — Ambulatory Visit (INDEPENDENT_AMBULATORY_CARE_PROVIDER_SITE_OTHER): Payer: Medicare Other | Admitting: Internal Medicine

## 2019-12-09 ENCOUNTER — Other Ambulatory Visit: Payer: Self-pay

## 2019-12-09 VITALS — BP 138/80 | HR 65 | Temp 98.4°F | Ht 62.0 in | Wt 125.8 lb

## 2019-12-09 DIAGNOSIS — B351 Tinea unguium: Secondary | ICD-10-CM

## 2019-12-09 DIAGNOSIS — W57XXXD Bitten or stung by nonvenomous insect and other nonvenomous arthropods, subsequent encounter: Secondary | ICD-10-CM

## 2019-12-09 DIAGNOSIS — I10 Essential (primary) hypertension: Secondary | ICD-10-CM | POA: Diagnosis not present

## 2019-12-09 DIAGNOSIS — S70361D Insect bite (nonvenomous), right thigh, subsequent encounter: Secondary | ICD-10-CM | POA: Diagnosis not present

## 2019-12-09 DIAGNOSIS — E559 Vitamin D deficiency, unspecified: Secondary | ICD-10-CM | POA: Diagnosis not present

## 2019-12-09 DIAGNOSIS — W57XXXA Bitten or stung by nonvenomous insect and other nonvenomous arthropods, initial encounter: Secondary | ICD-10-CM | POA: Insufficient documentation

## 2019-12-09 DIAGNOSIS — E538 Deficiency of other specified B group vitamins: Secondary | ICD-10-CM | POA: Diagnosis not present

## 2019-12-09 DIAGNOSIS — S70361A Insect bite (nonvenomous), right thigh, initial encounter: Secondary | ICD-10-CM | POA: Insufficient documentation

## 2019-12-09 DIAGNOSIS — Z1329 Encounter for screening for other suspected endocrine disorder: Secondary | ICD-10-CM

## 2019-12-09 DIAGNOSIS — Z1389 Encounter for screening for other disorder: Secondary | ICD-10-CM

## 2019-12-09 DIAGNOSIS — Z1231 Encounter for screening mammogram for malignant neoplasm of breast: Secondary | ICD-10-CM | POA: Diagnosis not present

## 2019-12-09 MED ORDER — LOSARTAN POTASSIUM 100 MG PO TABS: 100.0000 mg | ORAL_TABLET | Freq: Every day | ORAL | 3 refills | Status: DC

## 2019-12-09 MED ORDER — CICLOPIROX 8 % EX SOLN: Freq: Every day | CUTANEOUS | 11 refills | Status: DC

## 2019-12-09 MED ORDER — DOXYCYCLINE HYCLATE 100 MG PO TABS: 100.0000 mg | ORAL_TABLET | Freq: Two times a day (BID) | ORAL | 0 refills | Status: DC

## 2019-12-09 MED ORDER — HYDROCHLOROTHIAZIDE 12.5 MG PO TABS: 12.5000 mg | ORAL_TABLET | Freq: Every day | ORAL | 3 refills | Status: DC

## 2019-12-09 NOTE — Patient Instructions (Addendum)
Dr. Esmond Plants [phone]   Shoulder Range of Motion Exercises Shoulder range of motion (ROM) exercises are done to keep the shoulder moving freely or to increase movement. They are often recommended for people who have shoulder pain or stiffness or who are recovering from a shoulder surgery. Phase 1 exercises When you are able, do this exercise 1-2 times per day for 30-60 seconds in each direction, or as directed by your health care provider. Pendulum exercise To do this exercise while sitting: 1. Sit in a chair or at the edge of your bed with your feet flat on the floor. 2. Let your affected arm hang down in front of you over the edge of the bed or chair. 3. Relax your shoulder, arm, and hand. [place] your body so your arm gently swings in small circles. You can also use your unaffected arm to start the motion. 5. Repeat changing the direction of the circles, swinging your arm left and right, and swinging your arm forward and back. To do this exercise while standing: 1. Stand next to a sturdy chair or table, and hold on to it with your hand on your unaffected side. 2. Bend forward at the waist. 3. Bend your knees slightly. 4. Relax your shoulder, arm, and hand. 5. While keeping your shoulder relaxed, use body motion to swing your arm in small circles. 6. Repeat changing the direction of the circles, swinging your arm left and right, and swinging your arm forward and back. 7. Between exercises, stand up tall and take a short break to relax your lower back.  Phase 2 exercises Do these exercises 1-2 times per day or as told by your health care provider. Hold each stretch for 30 seconds, and repeat 3 times. Do the exercises with one or both arms as instructed by your health care provider. For these exercises, sit at a table with your hand and arm supported by the table. A chair that slides easily or has wheels can be helpful. External rotation 1. Turn your chair so that your affected  side is nearest to the table. 2. Place your forearm on the table to your side. Bend your elbow about 90 at the elbow (right angle) and place your hand palm facing down on the table. Your elbow should be about 6 inches away from your side. 3. Keeping your arm on the table, lean your body forward. Abduction 1. Turn your chair so that your affected side is nearest to the table. 2. Place your forearm and hand on the table so that your thumb points toward the ceiling and your arm is straight out to your side. 3. Slide your hand out to the side and away from you, using your unaffected arm to do the work. 4. To increase the stretch, you can slide your chair away from the table. Flexion: forward stretch 1. Sit facing the table. Place your hand and elbow on the table in front of you. 2. Slide your hand forward and away from you, using your unaffected arm to do the work. 3. To increase the stretch, you can slide your chair backward. Phase 3 exercises Do these exercises 1-2 times per day or as told by your health care provider. Hold each stretch for 30 seconds, and repeat 3 times. Do the exercises with one or both arms as instructed by your health care provider. Cross-body stretch: posterior capsule stretch 1. Lift your arm straight out in front of you. 2. Bend your arm 90  at the elbow (right angle) so your forearm moves across your body. 3. Use your other arm to gently pull the elbow across your body, toward your other shoulder. Wall climbs 1. Stand with your affected arm extended out to the side with your hand resting on a door frame. 2. Slide your hand slowly up the door frame. 3. To increase the stretch, step through the door frame. Keep your body upright and do not lean. Wand exercises You will need a cane, a piece of PVC pipe, or a sturdy wooden dowel for wand exercises. Flexion To do this exercise while standing: 1. Hold the wand with both of your hands, palms down. 2. Using the other arm to  help, lift your arms up and over your head, if able. 3. Push upward with your other arm to gently increase the stretch. To do this exercise while lying down: 1. Lie on your back with your elbows resting on the floor and the wand in both your hands. Your hands will be palm down, or pointing toward your feet. 2. Lift your hands toward the ceiling, using your unaffected arm to help if needed. 3. Bring your arms overhead as able, using your unaffected arm to help if needed. Internal rotation 1. Stand while holding the wand behind you with both hands. Your unaffected arm should be extended above your head with the arm of the affected side extended behind you at the level of your waist. The wand should be pointing straight up and down as you hold it. 2. Slowly pull the wand up behind your back by straightening the elbow of your unaffected arm and bending the elbow of your affected arm. External rotation 1. Lie on your back with your affected upper arm supported on a small pillow or rolled towel. When you first do this exercise, keep your upper arm close to your body. Over time, bring your arm up to a 90 angle out to the side. 2. Hold the wand across your stomach and with both hands palm up. Your elbow on your affected side should be bent at a 90 angle. 3. Use your unaffected side to help push your forearm away from you and toward the floor. Keep your elbow on your affected side bent at a 90 angle. Contact a health care provider if you have:  New or increasing pain.  New numbness, tingling, weakness, or discoloration in your arm or hand. This information is not intended to replace advice given to you by your health care provider. Make sure you discuss any questions you have with your health care provider. Document Revised: 06/12/2017 Document Reviewed: 06/12/2017 Elsevier Patient Education  [place].  Shoulder Exercises Ask your health care provider which exercises are safe for you. Do  exercises exactly as told by your health care provider and adjust them as directed. It is normal to feel mild stretching, pulling, tightness, or discomfort as you do these exercises. Stop right away if you feel sudden pain or your pain gets worse. Do not begin these exercises until told by your health care provider. Stretching exercises External rotation and abduction This exercise is sometimes called corner stretch. This exercise rotates your arm outward (external rotation) and moves your arm out from your body (abduction). 1. Stand in a doorway with one of your feet slightly in front of the other. This is called a staggered stance. If you cannot reach your forearms to the door frame, stand facing a corner of a room. 2. Choose  one of the following positions as told by your health care provider: ? Place your hands and forearms on the door frame above your head. ? Place your hands and forearms on the door frame at the height of your head. ? Place your hands on the door frame at the height of your elbows. 3. Slowly move your weight onto your front foot until you feel a stretch across your chest and in the front of your shoulders. Keep your head and chest upright and keep your abdominal muscles tight. 4. Hold for __________ seconds. 5. To release the stretch, shift your weight to your back foot. Repeat __________ times. Complete this exercise __________ times a day. Extension, standing 1. Stand and hold a broomstick, a cane, or a similar object behind your back. ? Your hands should be a little wider than shoulder width apart. ? Your palms should face away from your back. 2. Keeping your elbows straight and your shoulder muscles relaxed, move the stick away from your body until you feel a stretch in your shoulders (extension). ? Avoid shrugging your shoulders while you move the stick. Keep your shoulder blades tucked down toward the middle of your back. 3. Hold for __________ seconds. 4. Slowly  return to the starting position. Repeat __________ times. Complete this exercise __________ times a day. Range-of-motion exercises Pendulum  1. Stand near a wall or a surface that you can hold onto for balance. 2. Bend at the waist and let your left / right arm hang straight down. Use your other arm to support you. Keep your back straight and do not lock your knees. 3. Relax your left / right arm and shoulder muscles, and move your hips and your trunk so your left / right arm swings freely. Your arm should swing because of the motion of your body, not because you are using your arm or shoulder muscles. 4. Keep moving your hips and trunk so your arm swings in the following directions, as told by your health care provider: ? Side to side. ? Forward and backward. ? In clockwise and counterclockwise circles. 5. Continue each motion for __________ seconds, or for as long as told by your health care provider. 6. Slowly return to the starting position. Repeat __________ times. Complete this exercise __________ times a day. Shoulder flexion, standing  1. Stand and hold a broomstick, a cane, or a similar object. Place your hands a little more than shoulder width apart on the object. Your left / right hand should be palm up, and your other hand should be palm down. 2. Keep your elbow straight and your shoulder muscles relaxed. Push the stick up with your healthy arm to raise your left / right arm in front of your body, and then over your head until you feel a stretch in your shoulder (flexion). ? Avoid shrugging your shoulder while you raise your arm. Keep your shoulder blade tucked down toward the middle of your back. 3. Hold for __________ seconds. 4. Slowly return to the starting position. Repeat __________ times. Complete this exercise __________ times a day. Shoulder abduction, standing 1. Stand and hold a broomstick, a cane, or a similar object. Place your hands a little more than shoulder width  apart on the object. Your left / right hand should be palm up, and your other hand should be palm down. 2. Keep your elbow straight and your shoulder muscles relaxed. Push the object across your body toward your left / right side. Raise your left /  right arm to the side of your body (abduction) until you feel a stretch in your shoulder. ? Do not raise your arm above shoulder height unless your health care provider tells you to do that. ? If directed, raise your arm over your head. ? Avoid shrugging your shoulder while you raise your arm. Keep your shoulder blade tucked down toward the middle of your back. 3. Hold for __________ seconds. 4. Slowly return to the starting position. Repeat __________ times. Complete this exercise __________ times a day. Internal rotation  1. Place your left / right hand behind your back, palm up. 2. Use your other hand to dangle an exercise band, a towel, or a similar object over your shoulder. Grasp the band with your left / right hand so you are holding on to both ends. 3. Gently pull up on the band until you feel a stretch in the front of your left / right shoulder. The movement of your arm toward the center of your body is called internal rotation. ? Avoid shrugging your shoulder while you raise your arm. Keep your shoulder blade tucked down toward the middle of your back. 4. Hold for __________ seconds. 5. Release the stretch by letting go of the band and lowering your hands. Repeat __________ times. Complete this exercise __________ times a day. Strengthening exercises External rotation  1. Sit in a stable chair without armrests. 2. Secure an exercise band to a stable object at elbow height on your left / right side. 3. Place a soft object, such as a folded towel or a small pillow, between your left / right upper arm and your body to move your elbow about 4 inches (10 cm) away from your side. 4. Hold the end of the exercise band so it is tight and there is  no slack. 5. Keeping your elbow pressed against the soft object, slowly move your forearm out, away from your abdomen (external rotation). Keep your body steady so only your forearm moves. 6. Hold for __________ seconds. 7. Slowly return to the starting position. Repeat __________ times. Complete this exercise __________ times a day. Shoulder abduction  1. Sit in a stable chair without armrests, or stand up. 2. Hold a __________ weight in your left / right hand, or hold an exercise band with both hands. 3. Start with your arms straight down and your left / right palm facing in, toward your body. 4. Slowly lift your left / right hand out to your side (abduction). Do not lift your hand above shoulder height unless your health care provider tells you that this is safe. ? Keep your arms straight. ? Avoid shrugging your shoulder while you do this movement. Keep your shoulder blade tucked down toward the middle of your back. 5. Hold for __________ seconds. 6. Slowly lower your arm, and return to the starting position. Repeat __________ times. Complete this exercise __________ times a day. Shoulder extension 1. Sit in a stable chair without armrests, or stand up. 2. Secure an exercise band to a stable object in front of you so it is at shoulder height. 3. Hold one end of the exercise band in each hand. Your palms should face each other. 4. Straighten your elbows and lift your hands up to shoulder height. 5. Step back, away from the secured end of the exercise band, until the band is tight and there is no slack. 6. Squeeze your shoulder blades together as you pull your hands down to the sides of your  thighs (extension). Stop when your hands are straight down by your sides. Do not let your hands go behind your body. 7. Hold for __________ seconds. 8. Slowly return to the starting position. Repeat __________ times. Complete this exercise __________ times a day. Shoulder row 1. Sit in a stable chair  without armrests, or stand up. 2. Secure an exercise band to a stable object in front of you so it is at waist height. 3. Hold one end of the exercise band in each hand. Position your palms so that your thumbs are facing the ceiling (neutral position). 4. Bend each of your elbows to a 90-degree angle (right angle) and keep your upper arms at your sides. 5. Step back until the band is tight and there is no slack. 6. Slowly pull your elbows back behind you. 7. Hold for __________ seconds. 8. Slowly return to the starting position. Repeat __________ times. Complete this exercise __________ times a day. Shoulder press-ups  1. Sit in a stable chair that has armrests. Sit upright, with your feet flat on the floor. 2. Put your hands on the armrests so your elbows are bent and your fingers are pointing forward. Your hands should be about even with the sides of your body. 3. Push down on the armrests and use your arms to lift yourself off the chair. Straighten your elbows and lift yourself up as much as you comfortably can. ? Move your shoulder blades down, and avoid letting your shoulders move up toward your ears. ? Keep your feet on the ground. As you get stronger, your feet should support less of your body weight as you lift yourself up. 4. Hold for __________ seconds. 5. Slowly lower yourself back into the chair. Repeat __________ times. Complete this exercise __________ times a day. Wall push-ups  1. Stand so you are facing a stable wall. Your feet should be about one arm-length away from the wall. 2. Lean forward and place your palms on the wall at shoulder height. 3. Keep your feet flat on the floor as you bend your elbows and lean forward toward the wall. 4. Hold for __________ seconds. 5. Straighten your elbows to push yourself back to the starting position. Repeat __________ times. Complete this exercise __________ times a day. This information is not intended to replace advice given to  you by your health care provider. Make sure you discuss any questions you have with your health care provider. Document Revised: 08/22/2018 Document Reviewed: 05/30/2018 Elsevier Patient Education  [place].    Hypokalemia Hypokalemia means that the amount of potassium in the blood is lower than normal. Potassium is a chemical (electrolyte) that helps regulate the amount of fluid in the body. It also stimulates muscle tightening (contraction) and helps nerves work properly. Normally, most of the body's potassium is inside cells, and only a very small amount is in the blood. Because the amount in the blood is so small, minor changes to potassium levels in the blood can be life-threatening. What are the causes? This condition may be caused by:  Antibiotic medicine.  Diarrhea or vomiting. Taking too much of a medicine that helps you have a bowel movement (laxative) can cause diarrhea and lead to hypokalemia.  Chronic kidney disease (CKD).  Medicines that help the body get rid of excess fluid (diuretics).  Eating disorders, such as bulimia.  Low magnesium levels in the body.  Sweating a lot. What are the signs or symptoms? Symptoms of this condition include:  Weakness.  Constipation.  Fatigue.  Muscle cramps.  Mental confusion.  Skipped heartbeats or irregular heartbeat (palpitations).  Tingling or numbness. How is this diagnosed? This condition is diagnosed with a blood test. How is this treated? This condition may be treated by:  Taking potassium supplements by mouth.  Adjusting the medicines that you take.  Eating more foods that contain a lot of potassium. If your potassium level is very low, you may need to get potassium through an IV and be monitored in the hospital. Follow these instructions at home:   Take over-the-counter and prescription medicines only as told by your health care provider. This includes vitamins and supplements.  Eat a healthy  diet. A healthy diet includes fresh fruits and vegetables, whole grains, healthy fats, and lean proteins.  If instructed, eat more foods that contain a lot of potassium. This includes: ? Nuts, such as peanuts and pistachios. ? Seeds, such as sunflower seeds and pumpkin seeds. ? Peas, lentils, and lima beans. ? Whole grain and bran cereals and breads. ? Fresh fruits and vegetables, such as apricots, avocado, bananas, cantaloupe, kiwi, oranges, tomatoes, asparagus, and potatoes. ? Orange juice. ? Tomato juice. ? Red meats. ? Yogurt.  Keep all follow-up visits as told by your health care provider. This is important. Contact a health care provider if you:  Have weakness that gets worse.  Feel your heart pounding or racing.  Vomit.  Have diarrhea.  Have diabetes (diabetes mellitus) and you have trouble keeping your blood sugar (glucose) in your target range. Get help right away if you:  Have chest pain.  Have shortness of breath.  Have vomiting or diarrhea that lasts for more than 2 days.  Faint. Summary  Hypokalemia means that the amount of potassium in the blood is lower than normal.  This condition is diagnosed with a blood test.  Hypokalemia may be treated by taking potassium supplements, adjusting the medicines that you take, or eating more foods that are high in potassium.  If your potassium level is very low, you may need to get potassium through an IV and be monitored in the hospital. This information is not intended to replace advice given to you by your health care provider. Make sure you discuss any questions you have with your health care provider. Document Revised: 12/11/2017 Document Reviewed: 12/11/2017 Elsevier Patient Education  [place].    Fungal Nail Infection A fungal nail infection is a common infection of the toenails or fingernails. This condition affects toenails more often than fingernails. It often affects the great, or big, toes. More  than one nail may be infected. The condition can be passed from person to person (is contagious). What are the causes? This condition is caused by a fungus. Several types of fungi can cause the infection. These fungi are common in moist and warm areas. If your hands or feet come into contact with the fungus, it may get into a crack in your fingernail or toenail and cause the infection. What increases the risk? The following factors may make you more likely to develop this condition:  Being female.  Being of older age.  Living with someone who has the fungus.  Walking barefoot in areas where the fungus thrives, such as showers or locker rooms.  Wearing shoes and socks that cause your feet to sweat.  Having a nail injury or a recent nail surgery.  Having certain medical conditions, such as: ? Athlete's foot. ? Diabetes. ? Psoriasis. ? Poor circulation. ?  A weak body defense system (immune system). What are the signs or symptoms? Symptoms of this condition include:  A pale spot on the nail.  Thickening of the nail.  A nail that becomes yellow or brown.  A brittle or ragged nail edge.  A crumbling nail.  A nail that has lifted away from the nail bed. How is this diagnosed? This condition is diagnosed with a physical exam. Your health care provider may take a scraping or clipping from your nail to test for the fungus. How is this treated? Treatment is not needed for mild infections. If you have significant nail changes, treatment may include:  Antifungal medicines taken by mouth (orally). You may need to take the medicine for several weeks or several months, and you may not see the results for a long time. These medicines can cause side effects. Ask your health care provider what problems to watch for.  Antifungal nail polish or nail cream. These may be used along with oral antifungal medicines.  Laser treatment of the nail.  Surgery to remove the nail. This may be needed  for the most severe infections. It can take a long time, usually up to a year, for the infection to go away. The infection may also come back. Follow these instructions at home: Medicines  Take or apply over-the-counter and prescription medicines only as told by your health care provider.  Ask your health care provider about using over-the-counter mentholated ointment on your nails. Nail care  Trim your nails often.  Wash and dry your hands and feet every day.  Keep your feet dry: ? Wear absorbent socks, and change your socks frequently. ? Wear shoes that allow air to circulate, such as sandals or canvas tennis shoes. Throw out old shoes.  Do not use artificial nails.  If you go to a nail salon, make sure you choose one that uses clean instruments.  Use antifungal foot powder on your feet and in your shoes. General instructions  Do not share personal items, such as towels or nail clippers.  Do not walk barefoot in shower rooms or locker rooms.  Wear rubber gloves if you are working with your hands in wet areas.  Keep all follow-up visits as told by your health care provider. This is important. Contact a health care provider if: Your infection is not getting better or it is getting worse after several months. Summary  A fungal nail infection is a common infection of the toenails or fingernails.  Treatment is not needed for mild infections. If you have significant nail changes, treatment may include taking medicine orally and applying medicine to your nails.  It can take a long time, usually up to a year, for the infection to go away. The infection may also come back.  Take or apply over-the-counter and prescription medicines only as told by your health care provider.  Follow instructions for taking care of your nails to help prevent infection from coming back or spreading. This information is not intended to replace advice given to you by your health care provider. Make  sure you discuss any questions you have with your health care provider. Document Revised: 08/21/2018 Document Reviewed: 10/04/2017 Elsevier Patient Education  [place].

## 2019-12-09 NOTE — Progress Notes (Signed)
Chief Complaint  Patient presents with   Follow-up   F/u  1. Left toenail 3rd to 5th toenails tried lamisil in the past but toenail fungus still present and wants topical medication  Declines podiatry for now 2. Right shoulder pain 2 years ago saw Dr. Esm[facility] in [place] pain 3/10 at times worse with ROM and given steroid inj x 1 which helped and thought pain due to bone spurs.  3. HTN on losartan 50 mg qd and hctz 12.5 mg qd BP elevated today home readings 139/70, 130/73, 136/78, 133/82, 135/77  4. Multiple tick bites recently due to outdoor work shoulder, L hip, L groin, left axilla and right post thigh most recently with redness at the thigh area    Review of Systems  Constitutional: Negative for weight loss.  HENT: Negative for hearing loss.   Eyes: Negative for blurred vision.  Respiratory: Negative for shortness of breath.   Cardiovascular: Negative for chest pain.  Gastrointestinal: Negative for abdominal pain.  Musculoskeletal: Positive for joint pain.  Skin: Negative for rash.  Neurological: Negative for headaches.  Psychiatric/Behavioral: Negative for depression and memory loss.   Past Medical History:  Diagnosis Date   Abnormal Pap smear of cervix    Allergy    Anxiety    Arthritis    Blood in stool    colonoscopy 02/2017   Chicken pox    Colon polyps    Depression    Diabetes mellitus without complication ([place])    type 2   Diverticulitis    GERD (gastroesophageal reflux disease)    h/o ulcers ? GI    Glaucoma    borderline    Heart murmur    Hyperlipidemia    Hypertension    PONV (postoperative nausea and vomiting)    pull tubes out,etc.   Past Surgical History:  Procedure Laterality Date   ANAL RECTAL MANOMETRY N/A 03/08/2017   Procedure: ANO RECTAL MANOMETRY;  Surgeon: Ileana Roup, MD;  Location: [facility];  Service: General;  Laterality: N/A;   CARPAL TUNNEL RELEASE     COLONOSCOPY WITH PROPOFOL N/A  03/07/2017   Procedure: COLONOSCOPY WITH PROPOFOL;  Surgeon: Ileana Roup, MD;  Location: [facility];  Service: General;  Laterality: N/A;   finergy surgery     cut finger sewn back on   HAND SURGERY     ring finger left hand    HEMORRHOID SURGERY     10/03/18 exc of mixed int/ext hemorrhoids x 3 columns Dr. Audie Clear   JOINT REPLACEMENT     left  hip 2005, 2010, right hip 2010    TOTAL HIP ARTHROPLASTY  04/23/2012   Procedure: TOTAL HIP ARTHROPLASTY ANTERIOR APPROACH;  Surgeon: Gearlean Alf, MD;  Location: [facility];  Service: Orthopedics;  Laterality: Right;   TOTAL HIP REVISION  04/23/2012   Procedure: TOTAL HIP REVISION;  Surgeon: Gearlean Alf, MD;  Location: [facility];  Service: Orthopedics;  Laterality: Left;   TOTAL KNEE ARTHROPLASTY Left 02/17/2018   Procedure: LEFT TOTAL KNEE ARTHROPLASTY;  Surgeon: Gaynelle Arabian, MD;  Location: [facility];  Service: Orthopedics;  Laterality: Left;   Family History  Problem Relation Age of Onset   Hypertension Mother    Arthritis Mother    Heart disease Mother    Cancer Father        lung died age 33    Alcohol abuse Son    Cancer Other        colon caner  Social History   Socioeconomic History   Marital status: Married    Spouse name: Not on file   Number of children: Not on file   Years of education: Not on file   Highest education level: Not on file  Occupational History   Not on file  Tobacco Use   Smoking status: Never Smoker   Smokeless tobacco: Never Used  Vaping Use   Vaping Use: Never used  Substance and Sexual Activity   Alcohol use: Yes    Alcohol/week: 6.0 - 10.0 standard drinks    Types: 6 - 10 Standard drinks or equivalent per week    Comment: 1 bottle qhs most nights x 1.5 years prior to 03/2017   Drug use: No   Sexual activity: Yes  Other Topics Concern   Not on file  Social History Narrative   Lives with her husband.   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    16 chickens, 1 Corporate treasurer, 2 dogs like growing crops   Social Determinants of Radio broadcast assistant Strain:    Difficulty of Paying Living Expenses:   Food Insecurity:    Worried About Charity fundraiser in the Last Year:    Arboriculturist in the Last Year:   Transportation Needs:    Film/video editor (Medical):    Lack of Transportation (Non-Medical):   Physical Activity:    Days of Exercise per Week:    Minutes of Exercise per Session:   Stress:    Feeling of Stress :   Social Connections:    Frequency of Communication with Friends and Family:    Frequency of Social Gatherings with Friends and Family:    Attends Religious Services:    Active Member of Clubs or Organizations:    Attends Music therapist:    Marital Status:   Intimate Partner Violence:    Fear of Current or Ex-Partner:    Emotionally Abused:    Physically Abused:    Sexually Abused:    Current Meds  Medication Sig   BIOTIN 5000 PO Take by mouth.   hydrochlorothiazide (HYDRODIURIL) 12.5 MG tablet Take 1 tablet (12.5 mg total) by mouth daily.   hydrocortisone cream 1 % Apply 1 application topically as needed for itching.   losartan (COZAAR) 100 MG tablet Take 1 tablet (100 mg total) by mouth daily.   Multiple Vitamin (MULTIVITAMIN) tablet Take 1 tablet by mouth daily.   [DISCONTINUED] hydrochlorothiazide (HYDRODIURIL) 12.5 MG tablet Take 1 tablet (12.5 mg total) by mouth daily.   [DISCONTINUED] losartan (COZAAR) 50 MG tablet Take 1 tablet (50 mg total) by mouth daily.   Allergies  Allergen Reactions   Latex     Skin irritation    Metformin And Related     Diarrhea    Propofol Nausea And Vomiting    Can do with nausea meds   No results found for this or any previous visit (from the past 08/15/58  hour(s)). Objective  Body mass index is 23.01 kg/m. Wt Readings from Last 3 Encounters:  12/09/19 125 lb 12.8 oz (57.1 kg)  06/11/19 125 lb (56.7 kg)  09/22/18 126 lb (57.2 kg)   Temp Readings from Last 3 Encounters:  12/09/19 98.4 F (  36.9 C) (Oral)  09/22/18 97.7 F (36.5 C)  02/19/18 98.5 F (36.9 C) (Oral)   BP Readings from Last 3 Encounters:  12/09/19 (!) 138/80  09/22/18 128/74  02/19/18 (!) 150/73   Pulse Readings from Last 3 Encounters:  12/09/19 65  09/22/18 87  02/19/18 67    Physical Exam Vitals and nursing note reviewed.  Constitutional:      Appearance: Normal appearance. She is well-developed and well-groomed.  HENT:     Head: Normocephalic and atraumatic.  Eyes:     Conjunctiva/sclera: Conjunctivae normal.     Pupils: Pupils are equal, round, and reactive to light.  Cardiovascular:     Rate and Rhythm: Normal rate and regular rhythm.     Heart sounds: Normal heart sounds. No murmur heard.   Pulmonary:     Effort: Pulmonary effort is normal.     Breath sounds: Normal breath sounds.  Skin:    General: Skin is warm and dry.  Neurological:     General: No focal deficit present.     Mental Status: She is alert and oriented to person, place, and time. Mental status is at baseline.     Gait: Gait normal.  Psychiatric:        Attention and Perception: Attention and perception normal.        Mood and Affect: Mood and affect normal.        Speech: Speech normal.        Behavior: Behavior normal. Behavior is cooperative.        Thought Content: Thought content normal.        Cognition and Memory: Cognition and memory normal.        Judgment: Judgment normal.     Assessment  Plan  Essential hypertension - Plan: losartan (COZAAR) 100 MG tablet inc from 50 mg, hydrochlorothiazide (HYDRODIURIL) 12.5 MG tablet  Tick bite of right thigh, subsequent encounter - Plan: doxycycline (VIBRA-TABS) 100 MG tablet [place]   Onychomycosis left food 3rd to 5th toes  - Plan: ciclopirox (PENLAC) 8 % solution   HM Flu shot had dermatology office in 02/2019 or 03/2019  Tdap2/11/19 prevnar 10/16/17 pna 23 utd Disc shingrix per pt had old shingrix vaccine  Declines covid 19 vaccine  [facility]. -negative 07/29/16   mamm[facility]ab[facility] 06/05/17 [facility]  mammo repeat 12/12/18 neg  ordered   Colonoscopy had 03/07/17 reviewed 2 mm polpy diverticulahyperplastic, Ext and Int Hemorrhoids Dr. Annye English f/u in 5 years   rec alcohol cessationshe has cut back congratulatedpreviously   Declines STD testing   Consider DEXA in futuredisc with pt today 12/05/18 and declined and would declines bisphosphonates if needed in future  saw dermatology in [place] 02/2019 and due 02/2020   Provider: Dr. Olivia Mackie McLean-[facility]oc[facility]

## 2019-12-17 ENCOUNTER — Other Ambulatory Visit (INDEPENDENT_AMBULATORY_CARE_PROVIDER_SITE_OTHER): Payer: Medicare Other

## 2019-12-17 ENCOUNTER — Other Ambulatory Visit: Payer: Self-pay | Admitting: Internal Medicine

## 2019-12-17 ENCOUNTER — Encounter: Payer: Self-pay | Admitting: Internal Medicine

## 2019-12-17 ENCOUNTER — Other Ambulatory Visit: Payer: Self-pay

## 2019-12-17 DIAGNOSIS — Z1329 Encounter for screening for other suspected endocrine disorder: Secondary | ICD-10-CM

## 2019-12-17 DIAGNOSIS — Z1389 Encounter for screening for other disorder: Secondary | ICD-10-CM | POA: Diagnosis not present

## 2019-12-17 DIAGNOSIS — W57XXXD Bitten or stung by nonvenomous insect and other nonvenomous arthropods, subsequent encounter: Secondary | ICD-10-CM | POA: Diagnosis not present

## 2019-12-17 DIAGNOSIS — E538 Deficiency of other specified B group vitamins: Secondary | ICD-10-CM | POA: Insufficient documentation

## 2019-12-17 DIAGNOSIS — S70361D Insect bite (nonvenomous), right thigh, subsequent encounter: Secondary | ICD-10-CM

## 2019-12-17 DIAGNOSIS — I10 Essential (primary) hypertension: Secondary | ICD-10-CM

## 2019-12-17 DIAGNOSIS — E559 Vitamin D deficiency, unspecified: Secondary | ICD-10-CM | POA: Diagnosis not present

## 2019-12-17 LAB — VITAMIN D 25 HYDROXY (VIT D DEFICIENCY, FRACTURES): VITD: 23.1 ng/mL — ABNORMAL LOW (ref 30.00–100.00)

## 2019-12-17 LAB — COMPREHENSIVE METABOLIC PANEL
ALT: 15 U/L (ref 0–35)
AST: 18 U/L (ref 0–37)
Albumin: 3.8 g/dL (ref 3.5–5.2)
Alkaline Phosphatase: 58 U/L (ref 39–117)
BUN: 13 mg/dL (ref 6–23)
CO2: 29 mEq/L (ref 19–32)
Calcium: 9.6 mg/dL (ref 8.4–10.5)
Chloride: 108 mEq/L (ref 96–112)
Creatinine, Ser: 0.56 mg/dL (ref 0.40–1.20)
GFR: 107.68 mL/min (ref >60.00)
Glucose, Bld: 84 mg/dL (ref 70–99)
Potassium: 4.5 mEq/L (ref 3.5–5.1)
Sodium: 139 mEq/L (ref 135–145)
Total Bilirubin: 0.4 mg/dL (ref 0.2–1.2)
Total Protein: 5.8 g/dL — ABNORMAL LOW (ref 6.0–8.3)

## 2019-12-17 LAB — LIPID PANEL
Cholesterol: 184 mg/dL (ref 0–200)
HDL: 58.7 mg/dL (ref >39.00)
LDL Cholesterol: 105 mg/dL — ABNORMAL HIGH (ref 0–99)
NonHDL: 125.65
Total CHOL/HDL Ratio: 3
Triglycerides: 103 mg/dL (ref 0.0–149.0)
VLDL: 20.6 mg/dL (ref 0.0–40.0)

## 2019-12-17 LAB — CBC WITH DIFFERENTIAL/PLATELET
Basophils Absolute: 0 10*3/uL (ref 0.0–0.1)
Basophils Relative: 0.2 % (ref 0.0–3.0)
Eosinophils Absolute: 0.3 10*3/uL (ref 0.0–0.7)
Eosinophils Relative: 5.1 % — ABNORMAL HIGH (ref 0.0–5.0)
HCT: 39.7 % (ref 36.0–46.0)
Hemoglobin: 13.2 g/dL (ref 12.0–15.0)
Lymphocytes Relative: 27.4 % (ref 12.0–46.0)
Lymphs Abs: 1.4 10*3/uL (ref 0.7–4.0)
MCHC: 33.2 g/dL (ref 30.0–36.0)
MCV: 99.8 fl (ref 78.0–100.0)
Monocytes Absolute: 0.7 10*3/uL (ref 0.1–1.0)
Monocytes Relative: 12.9 % — ABNORMAL HIGH (ref 3.0–12.0)
Neutro Abs: 2.8 10*3/uL (ref 1.4–7.7)
Neutrophils Relative %: 54.4 % (ref 43.0–77.0)
Platelets: 209 10*3/uL (ref 150.0–400.0)
RBC: 3.98 Mil/uL (ref 3.87–5.11)
RDW: 13.4 % (ref 11.5–15.5)
WBC: 5.2 10*3/uL (ref 4.0–10.5)

## 2019-12-17 LAB — TSH: TSH: 1.96 u[IU]/mL (ref 0.35–4.50)

## 2019-12-17 LAB — VITAMIN B12: Vitamin B-12: 208 pg/mL — ABNORMAL LOW (ref 211–911)

## 2019-12-18 ENCOUNTER — Encounter: Payer: Self-pay | Admitting: Internal Medicine

## 2019-12-18 LAB — URINALYSIS, ROUTINE W REFLEX MICROSCOPIC
Bacteria, UA: NONE SEEN /HPF
Bilirubin Urine: NEGATIVE
Glucose, UA: NEGATIVE
Hyaline Cast: NONE SEEN /LPF
Ketones, ur: NEGATIVE
Nitrite: NEGATIVE
Protein, ur: NEGATIVE
Specific Gravity, Urine: 1.026 (ref 1.001–1.03)
pH: 5.5 (ref 5.0–8.0)

## 2019-12-18 LAB — B. BURGDORFI ANTIBODIES: B burgdorferi Ab IgG+IgM: <0.9 index

## 2019-12-18 LAB — ROCKY MTN SPOTTED FVR ABS PNL(IGG+IGM)
RMSF IgG: NOT DETECTED
RMSF IgM: NOT DETECTED

## 2019-12-23 ENCOUNTER — Other Ambulatory Visit: Payer: Self-pay | Admitting: Internal Medicine

## 2019-12-23 ENCOUNTER — Telehealth: Payer: Self-pay | Admitting: Internal Medicine

## 2019-12-23 ENCOUNTER — Telehealth: Payer: Self-pay | Admitting: *Deleted

## 2019-12-23 DIAGNOSIS — R319 Hematuria, unspecified: Secondary | ICD-10-CM

## 2019-12-23 NOTE — Telephone Encounter (Signed)
Please place future orders for lab appt.  

## 2019-12-23 NOTE — Telephone Encounter (Signed)
Left message for patient to call back and schedule Medicare Annual Wellness Visit (AWV)  ° °This should be a telephone visit only=30 minutes. ° °No hx of AWV; please schedule at anytime with Denisa O'Brien-Blaney at [place] ° ° °

## 2019-12-25 ENCOUNTER — Other Ambulatory Visit: Payer: Medicare Other

## 2019-12-25 ENCOUNTER — Other Ambulatory Visit: Payer: Self-pay

## 2019-12-25 DIAGNOSIS — R319 Hematuria, unspecified: Secondary | ICD-10-CM

## 2019-12-26 LAB — URINE CULTURE
MICRO NUMBER:: 10824668
SPECIMEN QUALITY:: ADEQUATE

## 2019-12-31 ENCOUNTER — Other Ambulatory Visit: Payer: Self-pay

## 2019-12-31 ENCOUNTER — Ambulatory Visit (INDEPENDENT_AMBULATORY_CARE_PROVIDER_SITE_OTHER): Payer: Medicare Other

## 2019-12-31 DIAGNOSIS — E538 Deficiency of other specified B group vitamins: Secondary | ICD-10-CM | POA: Diagnosis not present

## 2019-12-31 MED ORDER — CYANOCOBALAMIN 1000 MCG/ML IJ SOLN
1000.0000 ug | Freq: Once | INTRAMUSCULAR | Status: AC
  Administered 2019-12-31: 1000 ug via INTRAMUSCULAR

## 2019-12-31 NOTE — Progress Notes (Signed)
Patient presented for B 12 injection to left deltoid, patient voiced no concerns nor showed any signs of distress during injection. 

## 2020-01-05 ENCOUNTER — Other Ambulatory Visit: Payer: Self-pay

## 2020-01-05 ENCOUNTER — Ambulatory Visit
Admission: RE | Admit: 2020-01-05 | Discharge: 2020-01-05 | Disposition: A | Discharge status: Home / Self Care | Payer: Medicare Other | Source: Ambulatory Visit | Attending: Internal Medicine | Admitting: Internal Medicine

## 2020-01-05 DIAGNOSIS — Z1231 Encounter for screening mammogram for malignant neoplasm of breast: Secondary | ICD-10-CM | POA: Diagnosis not present

## 2020-02-04 ENCOUNTER — Ambulatory Visit (INDEPENDENT_AMBULATORY_CARE_PROVIDER_SITE_OTHER): Payer: Medicare Other

## 2020-02-04 ENCOUNTER — Other Ambulatory Visit: Payer: Self-pay

## 2020-02-04 DIAGNOSIS — Z23 Encounter for immunization: Secondary | ICD-10-CM

## 2020-02-04 DIAGNOSIS — E538 Deficiency of other specified B group vitamins: Secondary | ICD-10-CM | POA: Diagnosis not present

## 2020-02-04 MED ORDER — CYANOCOBALAMIN 1000 MCG/ML IJ SOLN
1000.0000 ug | Freq: Once | INTRAMUSCULAR | Status: AC
  Administered 2020-02-04: 1000 ug via INTRAMUSCULAR

## 2020-02-04 NOTE — Progress Notes (Signed)
Patient presented for B 12 injection to right deltoid, patient voiced no concerns nor showed any signs of distress during injection. 

## 2020-02-17 DIAGNOSIS — M25511 Pain in right shoulder: Secondary | ICD-10-CM | POA: Diagnosis not present

## 2020-03-08 ENCOUNTER — Ambulatory Visit (INDEPENDENT_AMBULATORY_CARE_PROVIDER_SITE_OTHER): Payer: Medicare Other

## 2020-03-08 ENCOUNTER — Other Ambulatory Visit: Payer: Self-pay

## 2020-03-08 DIAGNOSIS — E538 Deficiency of other specified B group vitamins: Secondary | ICD-10-CM

## 2020-03-08 MED ORDER — CYANOCOBALAMIN 1000 MCG/ML IJ SOLN
1000.0000 ug | Freq: Once | INTRAMUSCULAR | Status: AC
  Administered 2020-03-08: 1000 ug via INTRAMUSCULAR

## 2020-03-08 NOTE — Progress Notes (Signed)
Patient presented for B 12 injection to left deltoid, patient voiced no concerns nor showed any signs of distress during injection. 

## 2020-03-15 DIAGNOSIS — L219 Seborrheic dermatitis, unspecified: Secondary | ICD-10-CM | POA: Diagnosis not present

## 2020-03-15 DIAGNOSIS — D225 Melanocytic nevi of trunk: Secondary | ICD-10-CM | POA: Diagnosis not present

## 2020-03-15 DIAGNOSIS — L578 Other skin changes due to chronic exposure to nonionizing radiation: Secondary | ICD-10-CM | POA: Diagnosis not present

## 2020-03-15 DIAGNOSIS — L821 Other seborrheic keratosis: Secondary | ICD-10-CM | POA: Diagnosis not present

## 2020-03-15 DIAGNOSIS — L814 Other melanin hyperpigmentation: Secondary | ICD-10-CM | POA: Diagnosis not present

## 2020-04-21 ENCOUNTER — Other Ambulatory Visit: Payer: Self-pay

## 2020-04-21 ENCOUNTER — Ambulatory Visit (INDEPENDENT_AMBULATORY_CARE_PROVIDER_SITE_OTHER): Payer: Medicare Other

## 2020-04-21 DIAGNOSIS — E538 Deficiency of other specified B group vitamins: Secondary | ICD-10-CM | POA: Diagnosis not present

## 2020-04-21 MED ORDER — CYANOCOBALAMIN 1000 MCG/ML IJ SOLN
1000.0000 ug | Freq: Once | INTRAMUSCULAR | Status: AC
  Administered 2020-04-21: 1000 ug via INTRAMUSCULAR

## 2020-04-21 NOTE — Progress Notes (Signed)
Patient presented for B 12 injection to left deltoid, patient voiced no concerns nor showed any signs of distress during injection. 

## 2020-05-17 ENCOUNTER — Telehealth: Payer: Self-pay | Admitting: Internal Medicine

## 2020-05-17 NOTE — Telephone Encounter (Signed)
Left message for patient to call back and schedule Medicare Annual Wellness Visit (AWV)  ° °This should be a telephone visit only=30 minutes. ° °No hx of AWV; please schedule at anytime with Denisa O'Brien-Blaney at  [place] ° ° °

## 2020-08-17 ENCOUNTER — Ambulatory Visit (INDEPENDENT_AMBULATORY_CARE_PROVIDER_SITE_OTHER): Payer: PPO

## 2020-08-17 VITALS — Ht 62.0 in | Wt 125.0 lb

## 2020-08-17 DIAGNOSIS — Z0000 Encounter for general adult medical examination without abnormal findings: Secondary | ICD-10-CM | POA: Diagnosis not present

## 2020-08-17 NOTE — Patient Instructions (Addendum)
Desiree Pittman , Thank you for taking time to come for your Medicare Wellness Visit. I appreciate your ongoing commitment to your health goals. Please review the following plan we discussed and let me know if I can assist you in the future.   These are the goals we discussed: Goals      Patient Stated   .  I want to be active doing more fun things (pt-stated)       This is a list of the screening recommended for you and due dates:  Health Maintenance  Topic Date Due  . DEXA scan (bone density measurement)  08/17/2021*  . Flu Shot  12/12/2020  . Mammogram  01/04/2022  . Colon Cancer Screening  03/08/2027  . Tetanus Vaccine  06/25/2027  .  Hepatitis C: One time screening is recommended by Center for Disease Control  (CDC) for  adults born from [birth year] through 1964.   Completed  . Pneumonia vaccines  Completed  . HPV Vaccine  Aged Out  . Complete foot exam   Discontinued  . Hemoglobin A1C  Discontinued  . Eye exam for diabetics  Discontinued  . COVID-19 Vaccine  Discontinued  *Topic was postponed. The date shown is not the original due date.    Immunizations Immunization History  Administered Date(s) Administered  . Fluad Quad(high Dose 65+) 02/04/2020  . Influenza,inj,quad, With Preservative 01/12/2017  . Influenza-Unspecified 02/07/2017, 02/12/2019  . Pneumococcal Conjugate-13 10/16/2017  . Pneumococcal Polysaccharide-23 01/14/2019  . Tdap 06/06/2007, 06/24/2017   Keep all routine maintenance appointments.   Cpe 12/07/20 @ 11:30  Advanced directives: End of life planning; Advance aging; Advanced directives discussed.  Copy of current HCPOA/Living Will requested.    Conditions/risks identified: none new  Follow up in one year for your annual wellness visit.   Preventive Care 22 Years and Older, Female Preventive care refers to lifestyle choices and visits with your health care provider that can promote health and wellness. What does preventive care include?  A yearly  physical exam. This is also called an annual well check.  Dental exams once or twice a year.  Routine eye exams. Ask your health care provider how often you should have your eyes checked.  Personal lifestyle choices, including:  Daily care of your teeth and gums.  Regular physical activity.  Eating a healthy diet.  Avoiding tobacco and drug use.  Limiting alcohol use.  Practicing safe sex.  Taking low-dose aspirin every day.  Taking vitamin and mineral supplements as recommended by your health care provider. What happens during an annual well check? The services and screenings done by your health care provider during your annual well check will depend on your age, overall health, lifestyle risk factors, and family history of disease. Counseling  Your health care provider may ask you questions about your:  Alcohol use.  Tobacco use.  Drug use.  Emotional well-being.  Home and relationship well-being.  Sexual activity.  Eating habits.  History of falls.  Memory and ability to understand (cognition).  Work and work Statistician.  Reproductive health. Screening  You may have the following tests or measurements:  Height, weight, and BMI.  Blood pressure.  Lipid and cholesterol levels. These may be checked every 5 years, or more frequently if you are over 11 years old.  Skin check.  Lung cancer screening. You may have this screening every year starting at age 58 if you have a 30-pack-year history of smoking and currently smoke or have quit within the past  15 years.  Fecal occult blood test (FOBT) of the stool. You may have this test every year starting at age 63.  Flexible sigmoidoscopy or colonoscopy. You may have a sigmoidoscopy every 5 years or a colonoscopy every 10 years starting at age 39.  Hepatitis C blood test.  Hepatitis B blood test.  Sexually transmitted disease (STD) testing.  Diabetes screening. This is done by checking your blood sugar  (glucose) after you have not eaten for a while (fasting). You may have this done every 1-3 years.  Bone density scan. This is done to screen for osteoporosis. You may have this done starting at age 76.  Mammogram. This may be done every 1-2 years. Talk to your health care provider about how often you should have regular mammograms. Talk with your health care provider about your test results, treatment options, and if necessary, the need for more tests. Vaccines  Your health care provider may recommend certain vaccines, such as:  Influenza vaccine. This is recommended every year.  Tetanus, diphtheria, and acellular pertussis (Tdap, Td) vaccine. You may need a Td booster every 10 years.  Zoster vaccine. You may need this after age 20.  Pneumococcal 13-valent conjugate (PCV13) vaccine. One dose is recommended after age 59.  Pneumococcal polysaccharide (PPSV23) vaccine. One dose is recommended after age 55. Talk to your health care provider about which screenings and vaccines you need and how often you need them. This information is not intended to replace advice given to you by your health care provider. Make sure you discuss any questions you have with your health care provider. Document Released: 05/27/2015 Document Revised: 01/18/2016 Document Reviewed: 03/01/2015 Elsevier Interactive Patient Education  2017 [place] Prevention in the Home Falls can cause injuries. They can happen to people of all ages. There are many things you can do to make your home safe and to help prevent falls. What can I do on the outside of my home?  Regularly fix the edges of walkways and driveways and fix any cracks.  Remove anything that might make you trip as you walk through a door, such as a raised step or threshold.  Trim any bushes or trees on the path to your home.  Use bright outdoor lighting.  Clear any walking paths of anything that might make someone trip, such as rocks or  tools.  Regularly check to see if handrails are loose or broken. Make sure that both sides of any steps have handrails.  Any raised decks and porches should have guardrails on the edges.  Have any leaves, snow, or ice cleared regularly.  Use sand or salt on walking paths during winter.  Clean up any spills in your garage right away. This includes oil or grease spills. What can I do in the bathroom?  Use night lights.  Install grab bars by the toilet and in the tub and shower. Do not use towel bars as grab bars.  Use non-skid mats or decals in the tub or shower.  If you need to sit down in the shower, use a plastic, non-slip stool.  Keep the floor dry. Clean up any water that spills on the floor as soon as it happens.  Remove soap buildup in the tub or shower regularly.  Attach bath mats securely with double-sided non-slip rug tape.  Do not have throw rugs and other things on the floor that can make you trip. What can I do in the bedroom?  Use night lights.  Make sure that you have a light by your bed that is easy to reach.  Do not use any sheets or blankets that are too big for your bed. They should not hang down onto the floor.  Have a firm chair that has side arms. You can use this for support while you get dressed.  Do not have throw rugs and other things on the floor that can make you trip. What can I do in the kitchen?  Clean up any spills right away.  Avoid walking on wet floors.  Keep items that you use a lot in easy-to-reach places.  If you need to reach something above you, use a strong step stool that has a grab bar.  Keep electrical cords out of the way.  Do not use floor polish or wax that makes floors slippery. If you must use wax, use non-skid floor wax.  Do not have throw rugs and other things on the floor that can make you trip. What can I do with my stairs?  Do not leave any items on the stairs.  Make sure that there are handrails on both  sides of the stairs and use them. Fix handrails that are broken or loose. Make sure that handrails are as long as the stairways.  Check any carpeting to make sure that it is firmly attached to the stairs. Fix any carpet that is loose or worn.  Avoid having throw rugs at the top or bottom of the stairs. If you do have throw rugs, attach them to the floor with carpet tape.  Make sure that you have a light switch at the top of the stairs and the bottom of the stairs. If you do not have them, ask someone to add them for you. What else can I do to help prevent falls?  Wear shoes that:  Do not have high heels.  Have rubber bottoms.  Are comfortable and fit you well.  Are closed at the toe. Do not wear sandals.  If you use a stepladder:  Make sure that it is fully opened. Do not climb a closed stepladder.  Make sure that both sides of the stepladder are locked into place.  Ask someone to hold it for you, if possible.  Clearly mark and make sure that you can see:  Any grab bars or handrails.  First and last steps.  Where the edge of each step is.  Use tools that help you move around (mobility aids) if they are needed. These include:  Canes.  Walkers.  Scooters.  Crutches.  Turn on the lights when you go into a dark area. Replace any light bulbs as soon as they burn out.  Set up your furniture so you have a clear path. Avoid moving your furniture around.  If any of your floors are uneven, fix them.  If there are any pets around you, be aware of where they are.  Review your medicines with your doctor. Some medicines can make you feel dizzy. This can increase your chance of falling. Ask your doctor what other things that you can do to help prevent falls. This information is not intended to replace advice given to you by your health care provider. Make sure you discuss any questions you have with your health care provider. Document Released: 02/24/2009 Document Revised:  10/06/2015 Document Reviewed: 06/04/2014 Elsevier Interactive Patient Education  2017 [employer].

## 2020-08-17 NOTE — Progress Notes (Signed)
Subjective:   Soo Steelman is a 70 y.o. female who presents for an Initial Medicare Annual Wellness Visit.  Review of Systems    No ROS.  Medicare Wellness Virtual Visit.    Cardiac Risk Factors include: advanced age (>59men, >72 women);hypertension;diabetes mellitus     Objective:    Today's Vitals   08/17/20 1116  Weight: 125 lb (56.7 kg)  Height: 5\' 2"  (1.575 m)   Body mass index is 22.86 kg/m.  Advanced Directives 08/17/2020 02/17/2018 02/12/2018 10/25/2017 03/04/2017 04/23/2012 04/14/2012  Does Patient Have a Medical Advance Directive? Yes Yes Yes Yes Yes Patient has advance directive, copy not in chart Patient has advance directive, copy not in chart  Type of Advance Directive [place];Living will [place];Living will [place];Living will - Living will [place];Living will [place];Living will  Does patient want to make changes to medical advance directive? No - Patient declined No - Patient declined No - Patient declined - - - -  Copy of [place] in Chart? No - copy requested No - copy requested No - copy requested - No - copy requested Copy requested from family Copy requested from family  Would patient like information on creating a medical advance directive? - No - Patient declined - - - - -  Pre-existing out of facility DNR order (yellow form or pink MOST form) - - - - - No No    Current Medications (verified) Outpatient Encounter Medications as of 08/17/2020  Medication Sig  . BIOTIN 5000 PO Take by mouth.  . ciclopirox (PENLAC) 8 % solution Apply topically at bedtime. Apply over nail and surrounding skin. Apply daily over previous coat. After seven (7) days, may remove with alcohol and continue cycle. X 48 weeks  . doxycycline (VIBRA-TABS) 100 MG tablet Take 1 tablet (100 mg total) by mouth 2 (two) times daily. With food x 7-10 days  .  hydrochlorothiazide (HYDRODIURIL) 12.5 MG tablet Take 1 tablet (12.5 mg total) by mouth daily.  . hydrocortisone cream 1 % Apply 1 application topically as needed for itching.  . losartan (COZAAR) 100 MG tablet Take 1 tablet (100 mg total) by mouth daily.  . Multiple Vitamin (MULTIVITAMIN) tablet Take 1 tablet by mouth daily.   No facility-administered encounter medications on file as of 08/17/2020.    Allergies (verified) Latex, Metformin and related, and Propofol   History: Past Medical History:  Diagnosis Date  . Abnormal Pap smear of cervix   . Allergy   . Anxiety   . Arthritis   . Blood in stool    colonoscopy 02/2017  . Chicken pox   . Colon polyps   . Depression   . Diabetes mellitus without complication ([place])    type 2  . Diverticulitis   . GERD (gastroesophageal reflux disease)    h/o ulcers ? GI   . Glaucoma    borderline   . Heart murmur   . Hyperlipidemia   . Hypertension   . PONV (postoperative nausea and vomiting)    pull tubes out,etc.   Past Surgical History:  Procedure Laterality Date  . ANAL RECTAL MANOMETRY N/A 03/08/2017   Procedure: ANO RECTAL MANOMETRY;  Surgeon: Ileana Roup, MD;  Location: [facility];  Service: General;  Laterality: N/A;  . CARPAL TUNNEL RELEASE    . COLONOSCOPY WITH PROPOFOL N/A 03/07/2017   Procedure: COLONOSCOPY WITH PROPOFOL;  Surgeon: Ileana Roup, MD;  Location: [facility];  Service: General;  Laterality: N/A;  . finergy surgery     cut finger sewn back on  . HAND SURGERY     ring finger left hand   . HEMORRHOID SURGERY     10/03/18 exc of mixed int/ext hemorrhoids x 3 columns Dr. Audie Clear  . JOINT REPLACEMENT     left  hip 2005, 2010, right hip 08-05-08   . TOTAL HIP ARTHROPLASTY  04/23/2012   Procedure: TOTAL HIP ARTHROPLASTY ANTERIOR APPROACH;  Surgeon: Gearlean Alf, MD;  Location: [facility];  Service: Orthopedics;  Laterality: Right;  . TOTAL HIP REVISION  04/23/2012   Procedure: TOTAL HIP REVISION;   Surgeon: Gearlean Alf, MD;  Location: [facility];  Service: Orthopedics;  Laterality: Left;  . TOTAL KNEE ARTHROPLASTY Left 02/17/2018   Procedure: LEFT TOTAL KNEE ARTHROPLASTY;  Surgeon: Gaynelle Arabian, MD;  Location: [facility];  Service: Orthopedics;  Laterality: Left;   Family History  Problem Relation Age of Onset  . Hypertension Mother   . Arthritis Mother   . Heart disease Mother   . Cancer Father        lung died age 36   . Alcohol abuse Son   . Cancer Other        colon caner    Social History   Socioeconomic History  . Marital status: Married    Spouse name: Not on file  . Number of children: Not on file  . Years of education: Not on file  . Highest education level: Not on file  Occupational History  . Not on file  Tobacco Use  . Smoking status: Never Smoker  . Smokeless tobacco: Never Used  Vaping Use  . Vaping Use: Never used  Substance and Sexual Activity  . Alcohol use: Yes    Alcohol/week: 6.0 - 10.0 standard drinks    Types: 6 - 10 Standard drinks or equivalent per week    Comment: 1 bottle qhs most nights x 1.5 years prior to 03/2017  . Drug use: No  . Sexual activity: Yes  Other Topics Concern  . Not on file  Social History Narrative   Lives with her husband.   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 Difficulty of Paying Living Expenses: Not hard at all  Food Insecurity: No Food Insecurity  . Worried About Charity fundraiser in the Last Year: Never true  . Ran Out of Food in the Last Year: Never true  Transportation Needs: No Transportation Needs  . Lack of Transportation (Medical): No  . Lack of Transportation (Non-Medical): No  Physical Activity:  Not on file  Stress: No Stress Concern Present  . Feeling of Stress : Not at all  Social Connections: Unknown  . Frequency of Communication with Friends and Family: Not on file  . Frequency of Social Gatherings with Friends and Family: Not on file  . Attends Religious Services: Not on file  . Active Member of Clubs or Organizations: Not on file  . Attends Archivist Meetings: Not on file  . Marital Status: Married  Tobacco Counseling Counseling given: Not Answered   Clinical Intake:  Pre-visit preparation completed: Yes    Nutrition Risk Assessment: Has the patient had any N/V/D within the last 2 weeks?  No  Does the patient have any non-healing wounds?  No  Has the patient had any unintentional weight loss or weight gain?  No   Diabetes: If diabetic, was a CBG obtained today?  No  Did the patient bring in their glucometer from home?  No   Financial Strains and Diabetes Management: Are you having any financial strains with the device, your supplies or your medication? No .  Does the patient want to be seen by Chronic Care Management for management of their diabetes?  No  Would the patient like to be referred to a Nutritionist or for Diabetic Management?  No       Diabetes:  (Followed by pcp)  How often do you need to have someone help you when you read instructions, pamphlets, or other written materials from your doctor or pharmacy?: 1 - Never   Interpreter Needed?: No      Activities of Daily Living In your present state of health, do you have any difficulty performing the following activities: 08/17/2020  Hearing? N  Vision? N  Difficulty concentrating or making decisions? N  Walking or climbing stairs? N  Dressing or bathing? N  Doing errands, shopping? N  Preparing Food and eating ? N  Using the Toilet? N  In the past six months, have you accidently leaked urine? N  Do you have problems with loss of bowel control? N  Managing your Medications?  N  Managing your Finances? N  Housekeeping or managing your Housekeeping? N  Some recent data might be hidden    Patient Care Team: McLean-Scocuzza, Nino Glow, MD as PCP - General (Internal Medicine)  Indicate any recent Medical Services you may have received from other than [facility] providers in the past year (date may be approximate).     Assessment:   This is a routine wellness examination for Emon.  I connected with Malay today by telephone and verified that I am speaking with the correct person using two identifiers. Location patient: home Location provider: work Persons participating in the virtual visit: patient, Marine scientist.    I discussed the limitations, risks, security and privacy concerns of performing an evaluation and management service by telephone and the availability of in person appointments. The patient expressed understanding and verbally consented to this telephonic visit.    Interactive audio and video telecommunications were attempted between this provider and patient, however failed, due to patient having technical difficulties OR patient did not have access to video capability.  We continued and completed visit with audio only.  Some vital signs may be absent or patient reported.   Hearing/Vision screen  Hearing Screening   125Hz  250Hz  500Hz  1000Hz  2000Hz  3000Hz  4000Hz  6000Hz  8000Hz   Right ear:           Left ear:           Comments: Patient is able to hear conversational tones without difficulty.  No issues reported.  Vision Screening Comments: Followed by Dr. Sunday Spillers Wears corrective lenses Visual acuity not assessed, virtual visit.      Dietary issues and exercise activities discussed: Current Exercise Habits: Home exercise routine, Intensity: Mild  Healthy diet Good water intake  Goals      Patient Stated   .  I want to be active doing more fun things (pt-stated)  Depression Screen PHQ 2/9 Scores 08/17/2020 06/11/2019 12/05/2018 06/24/2017  10/21/2015 01/31/2015  PHQ - 2 Score 0 0 0 0 4 0  PHQ- 9 Score - - - - 16 -    Fall Risk Fall Risk  08/17/2020 12/09/2019 06/11/2019 12/05/2018 06/24/2017  Falls in the past year? 0 1 0 0 No  Number falls in past yr: 0 0 - - -  Injury with Fall? 0 1 - - -  Risk for fall due to : - History of fall(s) - - -  Follow up Falls evaluation completed Falls evaluation completed - - -    FALL RISK PREVENTION PERTAINING TO THE HOME: Handrails in use when climbing stairs? Yes Home free of loose throw rugs in walkways, pet beds, electrical cords, etc? Yes  Adequate lighting in your home to reduce risk of falls? Yes   ASSISTIVE DEVICES UTILIZED TO PREVENT FALLS: Use of a cane, walker or w/c? No   TIMED UP AND GO: Was the test performed? No .  Virtual visit.   Cognitive Function:  Patient is alert and oriented x3.  Denies difficulty focusing, making decisions, memory loss.  MMSE/6CIT deferred. Normal by direct communication/observation.      Immunizations Immunization History  Administered Date(s) Administered  . Fluad Quad(high Dose 65+) 02/04/2020  . Influenza,inj,quad, With Preservative 01/12/2017  . Influenza-Unspecified 02/07/2017, 02/12/2019  . Pneumococcal Conjugate-13 10/16/2017  . Pneumococcal Polysaccharide-23 01/14/2019  . Tdap 06/06/2007, 06/24/2017   Health Maintenance Health Maintenance  Topic Date Due  . DEXA SCAN  08/17/2021 (Originally 01/14/2017)  . INFLUENZA VACCINE  12/12/2020  . MAMMOGRAM  01/04/2022  . COLONOSCOPY (Pts 45-44yrs Insurance coverage will need to be confirmed)  03/08/2027  . TETANUS/TDAP  06/25/2027  . Hepatitis C Screening  Completed  . PNA vac Low Risk Adult  Completed  . HPV VACCINES  Aged Out  . FOOT EXAM  Discontinued  . HEMOGLOBIN A1C  Discontinued  . OPHTHALMOLOGY EXAM  Discontinued  . COVID-19 Vaccine  Discontinued    Colorectal cancer screening: Type of screening: Colonoscopy. Completed 03/07/17. Repeat every 10 years   Mammogram status:  Completed 01/05/20. Repeat every year  Bone density- declined.  Lung Cancer Screening: (Low Dose CT Chest recommended if Age 34-80 years, 30 pack-year currently smoking OR have quit w/in 15years.) does not qualify.    Vision Screening: Recommended annual ophthalmology exams for early detection of glaucoma and other disorders of the eye. Is the patient up to date with their annual eye exam?  Yes   Dental Screening: Recommended annual dental exams for proper oral hygiene.  Community Resource Referral / Chronic Care Management: CRR required this visit?  No   CCM required this visit?  No      Plan:   Keep all routine maintenance appointments.   Cpe 12/07/20 @ 11:30  I have personally reviewed and noted the following in the patient's chart:   . Medical and social history . Use of alcohol, tobacco or illicit drugs  . Current medications and supplements . Functional ability and status . Nutritional status . Physical activity . Advanced directives . List of other physicians . Hospitalizations, surgeries, and ER visits in previous 12 months . Vitals . Screenings to include cognitive, depression, and falls . Referrals and appointments  In addition, I have reviewed and discussed with patient certain preventive protocols, quality metrics, and best practice recommendations. A written personalized care plan for preventive services as well as general preventive health recommendations were provided to patient via  mychart.     Varney Biles, LPN   11/20/4995

## 2020-09-13 ENCOUNTER — Telehealth: Payer: Self-pay | Admitting: Internal Medicine

## 2020-09-13 NOTE — Telephone Encounter (Signed)
Patient was calling in regards to a telephone encounter involving her husband who is a Adult nurse Patient. Documented in encounter in Patient's husbands chart and sent to Gae Bon his CMA,

## 2020-09-13 NOTE — Telephone Encounter (Signed)
PT called to return phone call from earlier 

## 2020-11-29 DIAGNOSIS — M1711 Unilateral primary osteoarthritis, right knee: Secondary | ICD-10-CM | POA: Diagnosis not present

## 2020-11-29 DIAGNOSIS — M25561 Pain in right knee: Secondary | ICD-10-CM | POA: Diagnosis not present

## 2020-11-29 DIAGNOSIS — M170 Bilateral primary osteoarthritis of knee: Secondary | ICD-10-CM | POA: Diagnosis not present

## 2020-11-29 DIAGNOSIS — M1712 Unilateral primary osteoarthritis, left knee: Secondary | ICD-10-CM | POA: Diagnosis not present

## 2020-12-06 ENCOUNTER — Other Ambulatory Visit: Payer: Self-pay | Admitting: Internal Medicine

## 2020-12-07 ENCOUNTER — Other Ambulatory Visit: Payer: Self-pay

## 2020-12-07 ENCOUNTER — Encounter: Payer: Self-pay | Admitting: Internal Medicine

## 2020-12-07 ENCOUNTER — Ambulatory Visit (INDEPENDENT_AMBULATORY_CARE_PROVIDER_SITE_OTHER): Payer: PPO | Admitting: Internal Medicine

## 2020-12-07 VITALS — BP 114/76 | HR 71 | Temp 97.0°F | Ht 62.6 in | Wt 120.2 lb

## 2020-12-07 DIAGNOSIS — M79642 Pain in left hand: Secondary | ICD-10-CM | POA: Diagnosis not present

## 2020-12-07 DIAGNOSIS — Z1231 Encounter for screening mammogram for malignant neoplasm of breast: Secondary | ICD-10-CM

## 2020-12-07 DIAGNOSIS — Z0000 Encounter for general adult medical examination without abnormal findings: Secondary | ICD-10-CM | POA: Diagnosis not present

## 2020-12-07 DIAGNOSIS — E538 Deficiency of other specified B group vitamins: Secondary | ICD-10-CM | POA: Diagnosis not present

## 2020-12-07 DIAGNOSIS — E559 Vitamin D deficiency, unspecified: Secondary | ICD-10-CM

## 2020-12-07 DIAGNOSIS — E785 Hyperlipidemia, unspecified: Secondary | ICD-10-CM | POA: Diagnosis not present

## 2020-12-07 DIAGNOSIS — R319 Hematuria, unspecified: Secondary | ICD-10-CM

## 2020-12-07 DIAGNOSIS — Z1329 Encounter for screening for other suspected endocrine disorder: Secondary | ICD-10-CM | POA: Diagnosis not present

## 2020-12-07 DIAGNOSIS — R5383 Other fatigue: Secondary | ICD-10-CM

## 2020-12-07 DIAGNOSIS — M79641 Pain in right hand: Secondary | ICD-10-CM | POA: Diagnosis not present

## 2020-12-07 DIAGNOSIS — I10 Essential (primary) hypertension: Secondary | ICD-10-CM

## 2020-12-07 LAB — COMPREHENSIVE METABOLIC PANEL
ALT: 14 U/L (ref 0–35)
AST: 15 U/L (ref 0–37)
Albumin: 4.1 g/dL (ref 3.5–5.2)
Alkaline Phosphatase: 53 U/L (ref 39–117)
BUN: 18 mg/dL (ref 6–23)
CO2: 28 mEq/L (ref 19–32)
Calcium: 9.7 mg/dL (ref 8.4–10.5)
Chloride: 101 mEq/L (ref 96–112)
Creatinine, Ser: 0.62 mg/dL (ref 0.40–1.20)
GFR: 91.2 mL/min (ref >60.00)
Glucose, Bld: 92 mg/dL (ref 70–99)
Potassium: 3.8 mEq/L (ref 3.5–5.1)
Sodium: 139 mEq/L (ref 135–145)
Total Bilirubin: 0.7 mg/dL (ref 0.2–1.2)
Total Protein: 6.5 g/dL (ref 6.0–8.3)

## 2020-12-07 LAB — CBC WITH DIFFERENTIAL/PLATELET
Basophils Absolute: 0 10*3/uL (ref 0.0–0.1)
Basophils Relative: 0.2 % (ref 0.0–3.0)
Eosinophils Absolute: 0.2 10*3/uL (ref 0.0–0.7)
Eosinophils Relative: 2.6 % (ref 0.0–5.0)
HCT: 43.9 % (ref 36.0–46.0)
Hemoglobin: 14.7 g/dL (ref 12.0–15.0)
Lymphocytes Relative: 23.5 % (ref 12.0–46.0)
Lymphs Abs: 1.4 10*3/uL (ref 0.7–4.0)
MCHC: 33.5 g/dL (ref 30.0–36.0)
MCV: 101.5 fl — ABNORMAL HIGH (ref 78.0–100.0)
Monocytes Absolute: 0.8 10*3/uL (ref 0.1–1.0)
Monocytes Relative: 12.9 % — ABNORMAL HIGH (ref 3.0–12.0)
Neutro Abs: 3.6 10*3/uL (ref 1.4–7.7)
Neutrophils Relative %: 60.8 % (ref 43.0–77.0)
Platelets: 219 10*3/uL (ref 150.0–400.0)
RBC: 4.32 Mil/uL (ref 3.87–5.11)
RDW: 14.1 % (ref 11.5–15.5)
WBC: 5.9 10*3/uL (ref 4.0–10.5)

## 2020-12-07 LAB — LIPID PANEL
Cholesterol: 241 mg/dL — ABNORMAL HIGH (ref 0–200)
HDL: 84.2 mg/dL (ref >39.00)
LDL Cholesterol: 142 mg/dL — ABNORMAL HIGH (ref 0–99)
NonHDL: 157.19
Total CHOL/HDL Ratio: 3
Triglycerides: 78 mg/dL (ref 0.0–149.0)
VLDL: 15.6 mg/dL (ref 0.0–40.0)

## 2020-12-07 LAB — TSH: TSH: 1.24 u[IU]/mL (ref 0.35–5.50)

## 2020-12-07 LAB — VITAMIN B12: Vitamin B-12: 306 pg/mL (ref 211–911)

## 2020-12-07 LAB — VITAMIN D 25 HYDROXY (VIT D DEFICIENCY, FRACTURES): VITD: 24.26 ng/mL — ABNORMAL LOW (ref 30.00–100.00)

## 2020-12-07 MED ORDER — LOSARTAN POTASSIUM 50 MG PO TABS: 50.0000 mg | ORAL_TABLET | Freq: Every day | ORAL | 3 refills | Status: DC

## 2020-12-07 MED ORDER — HYDROCHLOROTHIAZIDE 12.5 MG PO TABS: 12.5000 mg | ORAL_TABLET | Freq: Every day | ORAL | 3 refills | Status: DC

## 2020-12-07 NOTE — Progress Notes (Signed)
Chief Complaint  Patient presents with   Annual Exam   Annual  1. Htn controlled on hctz 12.5 mg qd losartan 50 mg qd  2. Right shoulder pain arthritis, right knee pain and hand arthritis f/u [facility] dr. Kenton Kingfisher s/p left total knee   Review of Systems  Constitutional:  Negative for weight loss.  HENT:  Negative for hearing loss.   Eyes:  Negative for blurred vision.  Respiratory:  Negative for shortness of breath.   Cardiovascular:  Negative for chest pain.  Gastrointestinal:  Negative for abdominal pain.  Genitourinary:  Negative for dysuria.  Musculoskeletal:  Positive for joint pain. Negative for falls.  Skin:  Negative for rash.  Neurological:  Negative for headaches.  Psychiatric/Behavioral:  Negative for depression.   Past Medical History:  Diagnosis Date   Abnormal Pap smear of cervix    Allergy    Anxiety    Arthritis    Blood in stool    colonoscopy 02/2017   Chicken pox    Colon polyps    Depression    Diabetes mellitus without complication ([place])    type 2   Diverticulitis    GERD (gastroesophageal reflux disease)    h/o ulcers ? GI    Glaucoma    borderline    Heart murmur    Hyperlipidemia    Hypertension    PONV (postoperative nausea and vomiting)    pull tubes out,etc.   Right knee pain    inj [facility] 11/2020   Shoulder arthritis    right shoulder Dr. Kenton Kingfisher [facility]   Past Surgical History:  Procedure Laterality Date   ANAL RECTAL MANOMETRY N/A 03/08/2017   Procedure: ANO RECTAL MANOMETRY;  Surgeon: Ileana Roup, MD;  Location: [facility];  Service: General;  Laterality: N/A;   CARPAL TUNNEL RELEASE     COLONOSCOPY WITH PROPOFOL N/A 03/07/2017   Procedure: COLONOSCOPY WITH PROPOFOL;  Surgeon: Ileana Roup, MD;  [facility];  Service: General;  Laterality: N/A;   finergy surgery     cut finger sewn back on   HAND SURGERY     ring finger left hand    HEMORRHOID SURGERY     10/03/18 exc of mixed int/ext  hemorrhoids x 3 columns Dr. Audie Clear   JOINT REPLACEMENT     left  hip 2005, 2010, right hip 2010    TOTAL HIP ARTHROPLASTY  04/23/2012   Procedure: TOTAL HIP ARTHROPLASTY ANTERIOR APPROACH;  Surgeon: Gearlean Alf, MD;  Location: [facility];  Service: Orthopedics;  Laterality: Right;   TOTAL HIP REVISION  04/23/2012   Procedure: TOTAL HIP REVISION;  Surgeon: Gearlean Alf, MD;  Location: [facility];  Service: Orthopedics;  Laterality: Left;   TOTAL KNEE ARTHROPLASTY Left 02/17/2018   Procedure: LEFT TOTAL KNEE ARTHROPLASTY;  Surgeon: Gaynelle Arabian, MD;  Location: [facility];  Service: Orthopedics;  Laterality: Left;   Family History  Problem Relation Age of Onset   Hypertension Mother    Arthritis Mother    Heart disease Mother    Cancer Father        lung died age 71    Alcohol abuse Son    Cancer Other        colon caner    Social History   Socioeconomic History   Marital status: Married    Spouse name: Not on file   Number of children: Not on file   Years of education: Not on file   Highest  education level: Not on file  Occupational History   Not on file  Tobacco Use   Smoking status: Never   Smokeless tobacco: Never  Vaping Use   Vaping Use: Never used  Substance and Sexual Activity   Alcohol use: Yes    Alcohol/week: 6.0 - 10.0 standard drinks    Types: 6 - 10 Standard drinks or equivalent per week    Comment: 1 bottle qhs most nights x 1.5 years prior to 03/2017   Drug use: No   Sexual activity: Yes  Other Topics Concern   Not on file  Social History Narrative   Lives with her husband.   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    16 chickens, 1 Corporate treasurer, 2 dogs like growing crops   Social Determinants of Radio broadcast assistant Strain: Low Risk    Difficulty of Paying Living Expenses: Not  hard at all  Food Insecurity: No Food Insecurity   Worried About Charity fundraiser in the Last Year: Never true   Arboriculturist in the Last Year: Never true  Transportation Needs: No Transportation Needs   Lack of Transportation (Medical): No   Lack of Transportation (Non-Medical): No  Physical Activity: Not on file  Stress: No Stress Concern Present   Feeling of Stress : Not at all  Social Connections: Unknown   Frequency of Communication with Friends and Family: Not on file   Frequency of Social Gatherings with Friends and Family: Not on file   Attends Religious Services: Not on file   Active Member of Clubs or Organizations: Not on file   Attends Archivist Meetings: Not on file   Marital Status: Married  Human resources officer Violence: Not At Risk   Fear of Current or Ex-Partner: No   Emotionally Abused: No   Physically Abused: No   Sexually Abused: No   Current Meds  Medication Sig   Multiple Vitamin (MULTIVITAMIN) tablet Take 1 tablet by mouth daily.   [DISCONTINUED] hydrochlorothiazide (HYDRODIURIL) 12.5 MG tablet Take 1 tablet (12.5 mg total) by mouth daily.   [DISCONTINUED] losartan (COZAAR) 50 MG tablet TAKE 1 TABLET BY MOUTH EVERY DAY   Allergies  Allergen Reactions   Latex     Skin irritation    Metformin And Related     Diarrhea    Propofol Nausea And Vomiting    Can do with nausea meds   No results found for this or any previous visit (from the past 08/18/58 hour(s)). Objective  Body mass index is 21.57 kg/m. Wt Readings from Last 3 Encounters:  12/07/20 120 lb 3.2 oz (54.5 kg)  08/17/20 125 lb (56.7 kg)  12/09/19 125 lb 12.8 oz (57.1 kg)   Temp Readings from Last 3 Encounters:  12/07/20 (!) 97 F (36.1 C) (Temporal)  12/09/19 98.4 F (36.9 C) (Oral)  09/22/18 97.7 F (36.5 C)   BP Readings from Last 3 Encounters:  12/07/20 114/76  12/09/19 (!) 138/80  09/22/18 128/74   Pulse Readings from Last 3 Encounters:  12/07/20 71  12/09/19 65   09/22/18 87    Physical Exam Vitals and nursing note reviewed.  Constitutional:      Appearance: Normal appearance. She is well-developed and well-groomed.  HENT:  Head: Normocephalic and atraumatic.  Eyes:     Conjunctiva/sclera: Conjunctivae normal.     Pupils: Pupils are equal, round, and reactive to light.  Cardiovascular:     Rate and Rhythm: Normal rate and regular rhythm.     Heart sounds: Normal heart sounds. No murmur heard. Pulmonary:     Effort: Pulmonary effort is normal.     Breath sounds: Normal breath sounds.  Chest:     Chest wall: No mass.  Breasts:    Breasts are symmetrical.     Right: Normal. No axillary adenopathy.     Left: Normal. No axillary adenopathy.  Abdominal:     Palpations: Abdomen is soft.  Lymphadenopathy:     Upper Body:     Right upper body: No axillary adenopathy.     Left upper body: No axillary adenopathy.  Skin:    General: Skin is warm and dry.  Neurological:     General: No focal deficit present.     Mental Status: She is alert and oriented to person, place, and time. Mental status is at baseline.     Gait: Gait normal.  Psychiatric:        Attention and Perception: Attention and perception normal.        Mood and Affect: Mood and affect normal.        Speech: Speech normal.        Behavior: Behavior normal. Behavior is cooperative.        Thought Content: Thought content normal.        Cognition and Memory: Cognition and memory normal.        Judgment: Judgment normal.    Assessment  Plan  Annual  Flu shot had 01/2020 Tdap 06/24/17 prevnar 10/16/17  pna 23 utd Disc shingrix per pt had old shingrix vaccine  Declines covid 19 vaccine   Pap get records [facility] obtained and reviewed.  -negative 07/29/16    mammo normal 2021 ordered 01/04/21   Colonoscopy had 03/07/17 reviewed 2 mm polpy diverticula hyperplastic, Ext and Int Hemorrhoids Dr. Annye English f/u in 5 years   rec alcohol cessation she has cut back  congratulated previously. Rec healthy diet and exercise     Declines STD testing   Consider DEXA in future disc with pt today 12/05/18 and declined and would declines bisphosphonates if needed in future declines 12/06/20     saw dermatology in [place] 02/2019 and due 02/2021  Hypertension, controlled on hczt 12.5 mg qd and losartan 50 mg qd- Plan: Lipid panel, Comprehensive metabolic panel, CBC with Differential/Platelet, CANCELED: Comprehensive metabolic panel,   Hyperlipidemia, unspecified hyperlipidemia type lipid rec healthy diet and exercise   Hematuria, unspecified type - pt declines today   Vitamin D deficiency - Plan: Vitamin D (25 hydroxy)  B12 deficiency - Plan: Vitamin B12 last inj >6 months ago  Pain in both hands likely arthritis and right shoulder pain and tkr left knee and pain right knee  [facility]u [facility] in [place]     Provider: Dr. Olivia Mackie McLean-Scocuzza-Internal Medicine

## 2021-05-30 ENCOUNTER — Other Ambulatory Visit: Payer: Self-pay

## 2021-05-30 ENCOUNTER — Ambulatory Visit
Admission: RE | Admit: 2021-05-30 | Discharge: 2021-05-30 | Disposition: A | Discharge status: Home / Self Care | Payer: PPO | Source: Ambulatory Visit | Attending: Internal Medicine | Admitting: Internal Medicine

## 2021-05-30 DIAGNOSIS — Z1231 Encounter for screening mammogram for malignant neoplasm of breast: Secondary | ICD-10-CM | POA: Diagnosis not present

## 2021-07-31 ENCOUNTER — Telehealth: Payer: Self-pay | Admitting: Internal Medicine

## 2021-07-31 ENCOUNTER — Other Ambulatory Visit: Payer: Self-pay

## 2021-07-31 ENCOUNTER — Ambulatory Visit (INDEPENDENT_AMBULATORY_CARE_PROVIDER_SITE_OTHER): Payer: PPO | Admitting: Adult Health

## 2021-07-31 ENCOUNTER — Encounter: Payer: Self-pay | Admitting: Adult Health

## 2021-07-31 VITALS — BP 122/70 | HR 74 | Ht 62.5 in | Wt 122.1 lb

## 2021-07-31 DIAGNOSIS — R300 Dysuria: Secondary | ICD-10-CM | POA: Diagnosis not present

## 2021-07-31 DIAGNOSIS — N390 Urinary tract infection, site not specified: Secondary | ICD-10-CM | POA: Diagnosis not present

## 2021-07-31 DIAGNOSIS — N3001 Acute cystitis with hematuria: Secondary | ICD-10-CM

## 2021-07-31 LAB — COMPREHENSIVE METABOLIC PANEL
ALT: 16 U/L (ref 0–35)
AST: 19 U/L (ref 0–37)
Albumin: 4.3 g/dL (ref 3.5–5.2)
Alkaline Phosphatase: 62 U/L (ref 39–117)
BUN: 13 mg/dL (ref 6–23)
CO2: 28 mEq/L (ref 19–32)
Calcium: 10.2 mg/dL (ref 8.4–10.5)
Chloride: 100 mEq/L (ref 96–112)
Creatinine, Ser: 0.65 mg/dL (ref 0.40–1.20)
GFR: 89.76 mL/min (ref >60.00)
Glucose, Bld: 95 mg/dL (ref 70–99)
Potassium: 4.1 mEq/L (ref 3.5–5.1)
Sodium: 139 mEq/L (ref 135–145)
Total Bilirubin: 0.7 mg/dL (ref 0.2–1.2)
Total Protein: 6.6 g/dL (ref 6.0–8.3)

## 2021-07-31 LAB — URINALYSIS, ROUTINE W REFLEX MICROSCOPIC
Bilirubin Urine: NEGATIVE
Ketones, ur: NEGATIVE
Nitrite: POSITIVE — AB
Specific Gravity, Urine: 1.025 (ref 1.000–1.030)
Total Protein, Urine: 100 — AB
Urine Glucose: NEGATIVE
Urobilinogen, UA: 0.2 (ref 0.0–1.0)
pH: 5.5 (ref 5.0–8.0)

## 2021-07-31 LAB — POCT URINALYSIS DIPSTICK
Bilirubin, UA: NEGATIVE
Glucose, UA: NEGATIVE
Ketones, UA: NEGATIVE
Nitrite, UA: POSITIVE
Protein, UA: POSITIVE — AB
Spec Grav, UA: >=1.03 — AB (ref 1.010–1.025)
Urobilinogen, UA: 0.2 E.U./dL
pH, UA: 6 (ref 5.0–8.0)

## 2021-07-31 LAB — CBC WITH DIFFERENTIAL/PLATELET
Basophils Absolute: 0 10*3/uL (ref 0.0–0.1)
Basophils Relative: 0.3 % (ref 0.0–3.0)
Eosinophils Absolute: 0.2 10*3/uL (ref 0.0–0.7)
Eosinophils Relative: 2.1 % (ref 0.0–5.0)
HCT: 41.4 % (ref 36.0–46.0)
Hemoglobin: 14 g/dL (ref 12.0–15.0)
Lymphocytes Relative: 19 % (ref 12.0–46.0)
Lymphs Abs: 1.4 10*3/uL (ref 0.7–4.0)
MCHC: 33.8 g/dL (ref 30.0–36.0)
MCV: 103 fl — ABNORMAL HIGH (ref 78.0–100.0)
Monocytes Absolute: 0.7 10*3/uL (ref 0.1–1.0)
Monocytes Relative: 8.7 % (ref 3.0–12.0)
Neutro Abs: 5.3 10*3/uL (ref 1.4–7.7)
Neutrophils Relative %: 69.9 % (ref 43.0–77.0)
Platelets: 230 10*3/uL (ref 150.0–400.0)
RBC: 4.02 Mil/uL (ref 3.87–5.11)
RDW: 13.1 % (ref 11.5–15.5)
WBC: 7.5 10*3/uL (ref 4.0–10.5)

## 2021-07-31 MED ORDER — CEPHALEXIN 500 MG PO CAPS: 500.0000 mg | ORAL_CAPSULE | Freq: Three times a day (TID) | ORAL | 0 refills | Status: DC

## 2021-07-31 NOTE — Patient Instructions (Addendum)
Cephalexin Capsules or Tablets ?What is this medication? ?CEPHALEXIN (sef a LEX in) treats infections caused by bacteria. It belongs to a group of medications called cephalosporin antibiotics. It will not treat colds, the flu, or infections caused by viruses. ?This medicine may be used for other purposes; ask your health care provider or pharmacist if you have questions. ?COMMON BRAND NAME(S): Biocef, Daxbia, Keflex, Keftab ?What should I tell my care team before I take this medication? ?They need to know if you have any of these conditions: ?Bleeding disorder ?Kidney disease ?Liver disease ?Seizures ?Stomach or intestine problems like colitis ?An unusual or allergic reaction to cephalexin, other penicillin or cephalosporin antibiotics, other medications, foods, dyes, or preservatives ?Pregnant or trying to get pregnant ?Breast-feeding ?How should I use this medication? ?Take this medication by mouth. Take it as directed on the prescription label at the same time every day. You can take it with or without food. If it upsets your stomach, take it with food. Take all of this medication unless your care team tells you to stop it early. Keep taking it even if you think you are better. ?Talk to your care team about the use of this medication in children. While it may be prescribed for selected conditions, precautions do apply. ?Overdosage: If you think you have taken too much of this medicine contact a poison control center or emergency room at once. ?NOTE: This medicine is only for you. Do not share this medicine with others. ?What if I miss a dose? ?If you miss a dose, take it as soon as you can. If it is almost time for your next dose, take only that dose. Do not take double or extra doses. ?What may interact with this medication? ?Probenecid ?Some other antibiotics ?This list may not describe all possible interactions. Give your health care provider a list of all the medicines, herbs, non-prescription drugs, or  dietary supplements you use. Also tell them if you smoke, drink alcohol, or use illegal drugs. Some items may interact with your medicine. ?What should I watch for while using this medication? ?Tell your care team if your symptoms do not start to get better or if they get worse. ?Do not treat diarrhea with over the counter products. Contact your care team if you have diarrhea that lasts more than 2 days or if it is severe and watery. ?This medication may cause serious skin reactions. They can happen weeks to months after starting the medication. Contact your care team right away if you notice fevers or flu-like symptoms with a rash. The rash may be red or purple and then turn into blisters or peeling of the skin. Or, you might notice a red rash with swelling of the face, lips or lymph nodes in your neck or under your arms. ?If you have diabetes, you may get a false-positive result for sugar in your urine. Check with your care team. ?What side effects may I notice from receiving this medication? ?Side effects that you should report to your care team as soon as possible: ?Allergic reactions--skin rash, itching, hives, swelling of the face, lips, tongue, or throat ?Redness, blistering, peeling, or loosening of the skin, including inside the mouth ?Severe diarrhea, fever ?Unusual vaginal discharge, itching, or odor ?Side effects that usually do not require medical attention (report to your care team if they continue or are bothersome): ?Diarrhea ?Headache ?Nausea ?This list may not describe all possible side effects. Call your doctor for medical advice about side effects. You may  report side effects to FDA at 1-800-FDA-1088. ?Where should I keep my medication? ?Keep out of the reach of children and pets. ?Store at room temperature between 20 and 25 degrees C (68 and 77 degrees F). Throw away any unused medication after the expiration date. ?NOTE: This sheet is a summary. It may not cover all possible information. If you  have questions about this medicine, talk to your doctor, pharmacist, or health care provider. ?? 2022 Elsevier/Gold Standard (2020-04-25 00:00:00) ?Urinary Tract Infection, Adult ?A urinary tract infection (UTI) is an infection of any part of the urinary tract. The urinary tract includes the kidneys, ureters, bladder, and urethra. These organs make, store, and get rid of urine in the body. ?An upper UTI affects the ureters and kidneys. A lower UTI affects the bladder and urethra. ?What are the causes? ?Most urinary tract infections are caused by bacteria in your genital area around your urethra, where urine leaves your body. These bacteria grow and cause inflammation of your urinary tract. ?What increases the risk? ?You are more likely to develop this condition if: ?You have a urinary catheter that stays in place. ?You are not able to control when you urinate or have a bowel movement (incontinence). ?You are female and you: ?Use a spermicide or diaphragm for birth control. ?Have low estrogen levels. ?Are pregnant. ?You have certain genes that increase your risk. ?You are sexually active. ?You take antibiotic medicines. ?You have a condition that causes your flow of urine to slow down, such as: ?An enlarged prostate, if you are female. ?Blockage in your urethra. ?A kidney stone. ?A nerve condition that affects your bladder control (neurogenic bladder). ?Not getting enough to drink, or not urinating often. ?You have certain medical conditions, such as: ?Diabetes. ?A weak disease-fighting system (immunesystem). ?Sickle cell disease. ?Gout. ?Spinal cord injury. ?What are the signs or symptoms? ?Symptoms of this condition include: ?Needing to urinate right away (urgency). ?Frequent urination. This may include small amounts of urine each time you urinate. ?Pain or burning with urination. ?Blood in the urine. ?Urine that smells bad or unusual. ?Trouble urinating. ?Cloudy urine. ?Vaginal discharge, if you are female. ?Pain in  the abdomen or the lower back. ?You may also have: ?Vomiting or a decreased appetite. ?Confusion. ?Irritability or tiredness. ?A fever or chills. ?Diarrhea. ?The first symptom in older adults may be confusion. In some cases, they may not have any symptoms until the infection has worsened. ?How is this diagnosed? ?This condition is diagnosed based on your medical history and a physical exam. You may also have other tests, including: ?Urine tests. ?Blood tests. ?Tests for STIs (sexually transmitted infections). ?If you have had more than one UTI, a cystoscopy or imaging studies may be done to determine the cause of the infections. ?How is this treated? ?Treatment for this condition includes: ?Antibiotic medicine. ?Over-the-counter medicines to treat discomfort. ?Drinking enough water to stay hydrated. ?If you have frequent infections or have other conditions such as a kidney stone, you may need to see a health care provider who specializes in the urinary tract (urologist). ?In rare cases, urinary tract infections can cause sepsis. Sepsis is a life-threatening condition that occurs when the body responds to an infection. Sepsis is treated in the hospital with IV antibiotics, fluids, and other medicines. ?Follow these instructions at home: ?Medicines ?Take over-the-counter and prescription medicines only as told by your health care provider. ?If you were prescribed an antibiotic medicine, take it as told by your health care provider. Do not  stop using the antibiotic even if you start to feel better. ?General instructions ?Make sure you: ?Empty your bladder often and completely. Do not hold urine for long periods of time. ?Empty your bladder after sex. ?Wipe from front to back after urinating or having a bowel movement if you are female. Use each tissue only one time when you wipe. ?Drink enough fluid to keep your urine pale yellow. ?Keep all follow-up visits. This is important. ?Contact a health care provider if: ?Your  symptoms do not get better after 1-2 days. ?Your symptoms go away and then return. ?Get help right away if: ?You have severe pain in your back or your lower abdomen. ?You have a fever or chills. ?You hav

## 2021-07-31 NOTE — Telephone Encounter (Signed)
Pt is requesting lab order... No labs orders placed in system... Pt requesting labs before her next appt. Pt appt is schedule for 08/23/2021... Pt requesting callback ?

## 2021-07-31 NOTE — Progress Notes (Signed)
? ?Acute Office Visit ? ?Subjective:  ? ? Patient ID: Desiree Pittman, female    DOB: 1950/08/28, 71 y.o.   MRN: [id number] ? ?Chief Complaint  ?Patient presents with  ? Urinary Tract Infection  ? Dysuria  ? ? ?Urinary Tract Infection  ?Associated symptoms include frequency and urgency. Pertinent negatives include no chills, flank pain or hematuria.  ?Dysuria  ?Associated symptoms include frequency and urgency. Pertinent negatives include no chills, flank pain or hematuria.  ?Patient is in today for urinary symptoms that started 3 days ago. She has had urinary urgency. She has Urinary tract infection in the past. She has dark yellow urine odor and dysuria that started yesterday. She was up urinating all night small amounts.  ?Denies any visual blood in toilet.  ?Has had some diarrhea at times in the past 2 weeks, has seemed to improve. Denies any blood in stool, or dark tarry stools.  ?Denies any back or abdominal pain.  ?Patient  denies any fever, body aches,chills, rash, chest pain, shortness of breath, nausea, vomiting, or diarrhea.  ?Denies dizziness, lightheadedness, pre syncopal or syncopal episodes.  ? ? ?Past Medical History:  ?Diagnosis Date  ? Abnormal Pap smear of cervix   ? Allergy   ? Anxiety   ? Arthritis   ? Blood in stool   ? colonoscopy 02/2017  ? Chicken pox   ? Colon polyps   ? Depression   ? Diabetes mellitus without complication ([place])   ? type 2  ? Diverticulitis   ? GERD (gastroesophageal reflux disease)   ? h/o ulcers ? GI   ? Glaucoma   ? borderline   ? Heart murmur   ? Hyperlipidemia   ? Hypertension   ? PONV (postoperative nausea and vomiting)   ? pull tubes out,etc.  ? Right knee pain   ? inj [facility] 11/2020  ? Shoulder arthritis   ? right shoulder Dr. Kenton [facility]fisher [facility]  ? ? ?Past Surgical History:  ?Procedure Laterality Date  ? ANAL RECTAL MANOMETRY N/A 03/08/2017  ? Procedure: ANO RECTAL MANOMETRY;  Surgeon: Ileana Roup, MD;  Location: [facility];  Service: General;   Laterality: N/A;  ? CARPAL TUNNEL RELEASE    ? COLONOSCOPY WITH PROPOFOL N/A 03/07/2017  ? Procedure: COLONOSCOPY WITH PROPOFOL;  Surgeon: Ileana Roup, MD;  Location: [facility];  Service: General;  Laterality: N/A;  ? finergy surgery    ? cut finger sewn back on  ? HAND SURGERY    ? ring finger left hand   ? HEMORRHOID SURGERY    ? 10/03/18 exc of mixed int/ext hemorrhoids x 3 columns Dr. Audie Clear  ? JOINT REPLACEMENT    ? left  hip 2005, 2010, right hip 2010   ? TOTAL HIP ARTHROPLASTY  04/23/2012  ? Procedure: TOTAL HIP ARTHROPLASTY ANTERIOR APPROACH;  Surgeon: Gearlean Alf, MD;  Location: [facility];  Service: Orthopedics;  Laterality: Right;  ? TOTAL HIP REVISION  04/23/2012  ? Procedure: TOTAL HIP REVISION;  Surgeon: Gearlean Alf, MD;  Location: [facility];  Service: Orthopedics;  Laterality: Left;  ? TOTAL KNEE ARTHROPLASTY Left 02/17/2018  ? Procedure: LEFT TOTAL KNEE ARTHROPLASTY;  Surgeon: Gaynelle Arabian, MD;  Location: [facility];  Service: Orthopedics;  Laterality: Left;  ? ? ?Family History  ?Problem Relation Age of Onset  ? Hypertension Mother   ? Arthritis Mother   ? Heart disease Mother   ? Cancer Father   ?     lung died  age 77   ? Alcohol abuse Son   ? Cancer Other   ?     colon caner   ? ? ?Social History  ? ?Socioeconomic History  ? Marital status: Married  ?  Spouse name: Not on file  ? Number of children: Not on file  ? Years of education: Not on file  ? Highest education level: Not on file  ?Occupational History  ? Not on file  ?Tobacco Use  ? Smoking status: Never  ? Smokeless tobacco: Never  ?Vaping Use  ? Vaping Use: Never used  ?Substance and Sexual Activity  ? Alcohol use: Yes  ?  Alcohol/week: 6.0 - 10.0 standard drinks  ?  Types: 6 - 10 Standard drinks or equivalent per week  ?  Comment: 1 bottle qhs most nights x 1.5 years prior to 03/2017  ? Drug use: No  ? Sexual activity: Yes  ?Other Topics Concern  ? Not on file  ?Social History Narrative  ? Lives with her husband.  ? Her 76 year  old son lived with them until 08/10/2015, when she found him dead in their home of a self-inflicted GSW/suicide   ? 1 living daughter   ? 2 grandsons   ? 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 16 chickens, 1 royster, 2 dogs like growing crops  ? ?Social Determinants of Health  ? ?Financial Resource Strain: Low Risk   ? Difficulty of Paying Living Expenses: Not hard at all  ?Food Insecurity: No Food Insecurity  ? Worried About Charity fundraiser in the Last Year: Never true  ? Ran Out of Food in the Last Year: Never true  ?Transportation Needs: No Transportation Needs  ? Lack of Transportation (Medical): No  ? Lack of Transportation (Non-Medical): No  ?Physical Activity: Not on file  ?Stress: No Stress Concern Present  ? Feeling of Stress : Not at all  ?Social Connections: Unknown  ? Frequency of Communication with Friends and Family: Not on file  ? Frequency of Social Gatherings with Friends and Family: Not on file  ? Attends Religious Services: Not on file  ? Active Member of Clubs or Organizations: Not on file  ? Attends Archivist Meetings: Not on file  ? Marital Status: Married  ?Intimate Partner Violence: Not At Risk  ? Fear of Current or Ex-Partner: No  ? Emotionally Abused: No  ? Physically Abused: No  ? Sexually Abused: No  ? ? ?Outpatient Medications Prior to Visit  ?Medication Sig Dispense Refill  ? hydrochlorothiazide (HYDRODIURIL) 12.5 MG tablet Take 1 tablet (12.5 mg total) by mouth daily. 90 tablet 3  ? losartan (COZAAR) 50 MG tablet Take 1 tablet (50 mg total) by mouth daily. 90 tablet 3  ? Multiple Vitamin (MULTIVITAMIN) tablet Take 1 tablet by mouth daily.    ? hydrocortisone cream 1 % Apply 1 application topically as needed for itching. (Patient not taking: Reported on 07/31/2021)    ? ?No facility-administered medications prior to visit.  ? ? ?Allergies  ?Allergen Reactions  ? Latex   ?  Skin irritation   ? Metformin And Related   ?  Diarrhea ?  ? Propofol Nausea And  Vomiting  ?  Can do with nausea meds  ? ? ?Review of Systems  ?Constitutional:  Positive for fatigue. Negative for activity change, appetite change, chills, fever and unexpected weight change.  ?HENT: Negative.  Negative for sinus pain.   ?Eyes:  Negative.   ?Respiratory: Negative.    ?Cardiovascular: Negative.   ?Gastrointestinal: Negative.   ?Endocrine: Negative.   ?Genitourinary:  Positive for dysuria, frequency and urgency. Negative for decreased urine volume, difficulty urinating, dyspareunia, enuresis, flank pain, genital sores, hematuria, menstrual problem, pelvic pain, vaginal bleeding, vaginal discharge and vaginal pain.  ?     Denies any concerns for STD.   ?Musculoskeletal: Negative.   ?Skin: Negative.  Negative for rash.  ?Neurological: Negative.   ?Psychiatric/Behavioral: Negative.    ? ?   ?Objective:  ?  ?Physical Exam ? ? ?General: Appearance:    Well developed, well nourished female in no acute distress  ?Eyes:    PERRL, conjunctiva/corneas clear, EOM's intact       ?Lungs:     Clear to auscultation bilaterally, respirations unlabored  ?Heart:    Normal heart rate. Normal rhythm.  ?No murmurs, rubs, or gallops.   ?MS:   All extremities are intact.  ?  ?Neurologic:   Awake, alert, oriented x 3. No apparent focal neurological           defect.   ? Abdomen: soft and non-tender without masses, organomegaly or hernias noted.  No guarding or rebound  ? ?BP 122/70 (BP Location: Left Arm, Patient Position: Sitting, Cuff Size: Normal)   Pulse 74   Ht 5' 2.5" (1.588 m)   Wt 122 lb 1.9 oz (55.4 kg)   SpO2 98%   BMI 21.98 kg/m?  ?Wt Readings from Last 3 Encounters:  ?07/31/21 122 lb 1.9 oz (55.4 kg)  ?12/07/20 120 lb 3.2 oz (54.5 kg)  ?08/17/20 125 lb (56.7 kg)  ? ? ?Health Maintenance Due  ?Topic Date Due  ? Zoster Vaccines- Shingrix (1 of 2) Never done  ? ? ?There are no preventive care reminders to display for this patient. ? ? ?Lab Results  ?Component Value Date  ? TSH 1.24 12/07/2020  ? ?Lab Results   ?Component Value Date  ? WBC 7.5 07/31/2021  ? HGB 14.0 07/31/2021  ? HCT 41.4 07/31/2021  ? MCV 103.0 (H) 07/31/2021  ? PLT 230.0 07/31/2021  ? ?Lab Results  ?Component Value Date  ? NA 139 07/31/2021  ? K 4.1 03

## 2021-08-01 ENCOUNTER — Encounter: Payer: Self-pay | Admitting: Adult Health

## 2021-08-02 ENCOUNTER — Telehealth: Payer: Self-pay

## 2021-08-02 LAB — URINE CULTURE
MICRO NUMBER:: 13152380
SPECIMEN QUALITY:: ADEQUATE

## 2021-08-02 NOTE — Telephone Encounter (Signed)
Mychart msg sent

## 2021-08-02 NOTE — Progress Notes (Signed)
E coli in urine, complete Keflex should cover bacteria, follow up with PCP if not improving within 3 days. Hydrate. Recheck for clearance advised 2 weeks after completion of antibiotics if symptoms have resolved.  ?

## 2021-08-02 NOTE — Telephone Encounter (Signed)
Please sch fasting labs (no food x 8-12 hours before labs) before next appt will do cholesterol B12 and vit D  ?

## 2021-08-02 NOTE — Telephone Encounter (Signed)
Please advise  what labs you would like ordered  ?

## 2021-08-10 ENCOUNTER — Ambulatory Visit: Payer: PPO | Admitting: Urology

## 2021-08-10 ENCOUNTER — Encounter: Payer: Self-pay | Admitting: Urology

## 2021-08-10 ENCOUNTER — Encounter: Payer: Self-pay | Admitting: Internal Medicine

## 2021-08-10 VITALS — BP 156/69 | HR 76 | Ht 62.5 in | Wt 122.0 lb

## 2021-08-10 DIAGNOSIS — R8281 Pyuria: Secondary | ICD-10-CM | POA: Diagnosis not present

## 2021-08-10 DIAGNOSIS — R319 Hematuria, unspecified: Secondary | ICD-10-CM

## 2021-08-10 LAB — URINALYSIS, COMPLETE
Bilirubin, UA: NEGATIVE
Glucose, UA: NEGATIVE
Ketones, UA: NEGATIVE
Nitrite, UA: NEGATIVE
Protein,UA: NEGATIVE
Specific Gravity, UA: >1.03 — ABNORMAL HIGH (ref 1.005–1.030)
Urobilinogen, Ur: 0.2 mg/dL (ref 0.2–1.0)
pH, UA: 5.5 (ref 5.0–7.5)

## 2021-08-10 LAB — MICROSCOPIC EXAMINATION

## 2021-08-10 NOTE — Patient Instructions (Signed)
Take over the counter D-Mannose, Cranberry tablets daily ?

## 2021-08-11 NOTE — Progress Notes (Signed)
? ?08/10/2021 ?7:11 AM  ? ?Desiree Pittman ?[birth year] ?[id number] ? ?Referring provider: Doreen Beam, FNP ?No address on file ? ?Chief Complaint  ?Patient presents with  ? Hematuria  ? ? ?HPI: ?Desiree Pittman is a 71 y.o. female referred for evaluation of UTIs. ? ?Complains of recurrent urinary tract infections.  She estimates she was treated for 1 infection in 2020, 2 infections in 2021 and an infection last month ?She has had 5 urine cultures since 2020; 1 and 2020 growing E. coli and the most recent March 2023 growing E. coli.  The remaining cultures were either no growth or no significant growth ?Urinary symptoms typically include urinary frequency, urgency and dysuria ?No febrile UTIs, pyelonephritis or gross hematuria ?Completed a 7-day course of cephalexin 4 days ago ?No relation of symptom onset to intercourse ?States several of her urine specimens have shown blood which she is also concerned about ? ? ?PMH: ?Past Medical History:  ?Diagnosis Date  ? Abnormal Pap smear of cervix   ? Allergy   ? Anxiety   ? Arthritis   ? Blood in stool   ? colonoscopy 02/2017  ? Chicken pox   ? Colon polyps   ? Depression   ? Diabetes mellitus without complication ([place])   ? type 2  ? Diverticulitis   ? GERD (gastroesophageal reflux disease)   ? h/o ulcers ? GI   ? Glaucoma   ? borderline   ? Heart murmur   ? Hyperlipidemia   ? Hypertension   ? PONV (postoperative nausea and vomiting)   ? pull tubes out,etc.  ? Right knee pain   ? [facility] 11/2020  ? Shoulder arthritis   ? right shoulder Dr. Kenton [facility]fisher [facility]  ? ? ?Surgical History: ?Past Surgical History:  ?Procedure Laterality Date  ? ANAL RECTAL MANOMETRY N/A 03/08/2017  ? Procedure: ANO RECTAL MANOMETRY;  Surgeon: Ileana Roup, MD;  Location: [facility];  Service: General;  Laterality: N/A;  ? CARPAL TUNNEL RELEASE    ? COLONOSCOPY WITH PROPOFOL N/A 03/07/2017  ? Procedure: COLONOSCOPY WITH PROPOFOL;  Surgeon: Ileana Roup, MD;   Location: [facility];  Service: General;  Laterality: N/A;  ? finergy surgery    ? cut finger sewn back on  ? HAND SURGERY    ? ring finger left hand   ? HEMORRHOID SURGERY    ? 10/03/18 exc of mixed int/ext hemorrhoids x 3 columns Dr. Audie Clear  ? JOINT REPLACEMENT    ? left  hip 2005, 2010, right hip 2010   ? TOTAL HIP ARTHROPLASTY  04/23/2012  ? Procedure: TOTAL HIP ARTHROPLASTY ANTERIOR APPROACH;  Surgeon: Gearlean Alf, MD;  Location: [facility];  Service: Orthopedics;  Laterality: Right;  ? TOTAL HIP REVISION  04/23/2012  ? Procedure: TOTAL HIP REVISION;  Surgeon: Gearlean Alf, MD;  Location: [facility];  Service: Orthopedics;  Laterality: Left;  ? TOTAL KNEE ARTHROPLASTY Left 02/17/2018  ? Procedure: LEFT TOTAL KNEE ARTHROPLASTY;  Surgeon: Gaynelle Arabian, MD;  Location: [facility];  Service: Orthopedics;  Laterality: Left;  ? ? ?Home Medications:  ?Allergies as of 08/10/2021   ? ?   Reactions  ? Latex   ? Skin irritation   ? Metformin And Related   ? Diarrhea  ? Propofol Nausea And Vomiting  ? Can do with nausea meds  ? ?  ? ?  ?Medication List  ?  ? ?  ? Accurate as of August 10, 2021 11:59 PM. If  you have any questions, ask your nurse or doctor.  ?  ?  ? ?  ? ?STOP taking these medications   ? ?cephALEXin 500 MG capsule ?Commonly known as: KEFLEX ?Stopped by: Abbie Sons, MD ?  ?hydrocortisone cream 1 % ?Stopped by: Abbie Sons, MD ?  ? ?  ? ?TAKE these medications   ? ?hydrochlorothiazide 12.5 MG tablet ?Commonly known as: HYDRODIURIL ?Take 1 tablet (12.5 mg total) by mouth daily. ?  ?losartan 50 MG tablet ?Commonly known as: COZAAR ?Take 1 tablet (50 mg total) by mouth daily. ?  ?multivitamin tablet ?Take 1 tablet by mouth daily. ?  ? ?  ? ? ?Allergies:  ?Allergies  ?Allergen Reactions  ? Latex   ?  Skin irritation   ? Metformin And Related   ?  Diarrhea ?  ? Propofol Nausea And Vomiting  ?  Can do with nausea meds  ? ? ?Family History: ?Family History  ?Problem Relation Age of Onset  ? Hypertension Mother   ?  Arthritis Mother   ? Heart disease Mother   ? Cancer Father   ?     lung died age 7   ? Alcohol abuse Son   ? Cancer Other   ?     colon caner   ? ? ?Social History:  reports that she has never smoked. She has never used smokeless tobacco. She reports current alcohol use of about 6.0 - 10.0 standard drinks per week. She reports that she does not use drugs. ? ? ?Physical Exam: ?BP (!) 156/69   Pulse 76   Ht 5' 2.5" (1.588 m)   Wt 122 lb (55.3 kg)   BMI 21.96 kg/m?   ?Constitutional:  Alert and oriented, No acute distress. ?HEENT: [place] AT, moist mucus membranes.  Trachea midline, no masses. ?Respiratory: Normal respiratory effort, no increased work of breathing. ?Psychiatric: Normal mood and affect. ? ?Laboratory Data: ? ?Urinalysis ?Dipstick trace blood/1+ leukocytes ?Microscopy 6-10 WBC ? ? ?Assessment & Plan:   ? ?1.  Pyuria ?Does not meet the AUA definition of recurrent UTI ?We discussed urinary tract infections are common in postmenopausal women due to a number of factors including changes in the vaginal flora that occur after menopause and urethral length/location ?UA today with mild pyuria and repeat urine culture was ordered ?We discussed the potential benefit of cranberry tablets and d-mannose for UTI prevention ?She is concerned about the possibility bladder cancer and desires to proceed with cystoscopy ? ?2.  Dipstick positive hematuria ?UAs reviewed and she does not have clinically significant microscopic hematuria.  Several of her UAs have shown trace blood on dipstick and we discussed the high false positive rate of hematuria detected on dipstick.  No indication for upper tract imaging at this time ? ? ?Abbie Sons, MD ? ?[facility] ?[address] ?[place], [place] [address] ?[phone] ?

## 2021-08-14 LAB — CULTURE, URINE COMPREHENSIVE

## 2021-08-15 ENCOUNTER — Telehealth: Payer: Self-pay | Admitting: Urology

## 2021-08-15 NOTE — Telephone Encounter (Signed)
Final urine culture report just returned.  She was growing a low level of bacteria.  Is she having any UTI symptoms? ?

## 2021-08-17 ENCOUNTER — Encounter: Payer: Self-pay | Admitting: *Deleted

## 2021-08-18 ENCOUNTER — Ambulatory Visit: Payer: PPO

## 2021-08-23 ENCOUNTER — Ambulatory Visit: Payer: PPO | Admitting: Internal Medicine

## 2021-08-23 ENCOUNTER — Ambulatory Visit (INDEPENDENT_AMBULATORY_CARE_PROVIDER_SITE_OTHER): Payer: PPO

## 2021-08-23 VITALS — Ht 62.5 in | Wt 122.0 lb

## 2021-08-23 DIAGNOSIS — Z0000 Encounter for general adult medical examination without abnormal findings: Secondary | ICD-10-CM | POA: Diagnosis not present

## 2021-08-23 NOTE — Patient Instructions (Addendum)
?  Desiree Pittman , ?Thank you for taking time to come for your Medicare Wellness Visit. I appreciate your ongoing commitment to your health goals. Please review the following plan we discussed and let me know if I can assist you in the future.  ? ?These are the goals we discussed: ? Goals   ? ?  ? Patient Stated  ?   Maintain Healthy Lifestyle (pt-stated)   ?   Stay active ?Healthy diet ?  ? ?  ?  ?This is a list of the screening recommended for you and due dates:  ?Health Maintenance  ?Topic Date Due  ? Zoster (Shingles) Vaccine (1 of 2) 11/22/2021*  ? Flu Shot  12/12/2021  ? Mammogram  05/31/2023  ? Colon Cancer Screening  03/08/2027  ? Tetanus Vaccine  06/25/2027  ? Pneumonia Vaccine  Completed  ? Hepatitis C Screening: USPSTF Recommendation to screen - Ages 82-79 yo.  Completed  ? HPV Vaccine  Aged Out  ? Complete foot exam   Discontinued  ? Hemoglobin A1C  Discontinued  ? Eye exam for diabetics  Discontinued  ? DEXA scan (bone density measurement)  Discontinued  ? COVID-19 Vaccine  Discontinued  ?*Topic was postponed. The date shown is not the original due date.  ?  ?

## 2021-08-23 NOTE — Progress Notes (Signed)
Subjective:   Desiree Pittman is a 71 y.o. female who presents for Medicare Annual (Subsequent) preventive examination.  Review of Systems    No ROS.  Medicare Wellness Virtual Visit.  Visual/audio telehealth visit, UTA vital signs.   See social history for additional risk factors.   Cardiac Risk Factors include: advanced age (>21men, >52 women)     Objective:    Today's Vitals   08/23/21 0953  Weight: 122 lb (55.3 kg)  Height: 5' 2.5" (1.588 m)   Body mass index is 21.96 kg/m.     08/23/2021   10:06 AM 08/17/2020   11:26 AM 02/17/2018    3:59 PM 02/12/2018    9:30 AM 10/25/2017   11:07 AM 03/04/2017    1:56 PM 04/23/2012    6:04 PM  Advanced Directives  Does Patient Have a Medical Advance Directive? Yes Yes Yes Yes Yes Yes Patient has advance directive, copy not in chart  Type of Advance Directive Healthcare Power of [place];Living will Healthcare Power of [place];Living will Healthcare Power of [place];Living will Healthcare Power of [place];Living will  Living will Healthcare Power of [place];Living will  Does patient want to make changes to medical advance directive? No - Patient declined No - Patient declined No - Patient declined No - Patient declined     Copy of Healthcare Power of Attorney in Chart? No - copy requested No - copy requested No - copy requested No - copy requested  No - copy requested Copy requested from family  Would patient like information on creating a medical advance directive?   No - Patient declined      Pre-existing out of facility DNR order (yellow form or pink MOST form)       No    Current Medications (verified) Outpatient Encounter Medications as of 08/23/2021  Medication Sig   hydrochlorothiazide (HYDRODIURIL) 12.5 MG tablet Take 1 tablet (12.5 mg total) by mouth daily.   losartan (COZAAR) 50 MG tablet Take 1 tablet (50 mg total) by mouth daily.   Multiple Vitamin (MULTIVITAMIN) tablet Take 1 tablet by mouth daily.   No  facility-administered encounter medications on file as of 08/23/2021.    Allergies (verified) Latex, Metformin and related, and Propofol   History: Past Medical History:  Diagnosis Date   Abnormal Pap smear of cervix    Allergy    Anxiety    Arthritis    Blood in stool    colonoscopy 02/2017   Chicken pox    Colon polyps    Depression    Diabetes mellitus without complication (HCC)    type 2   Diverticulitis    GERD (gastroesophageal reflux disease)    h/o ulcers ? GI    Glaucoma    borderline    Heart murmur    Hyperlipidemia    Hypertension    PONV (postoperative nausea and vomiting)    pull tubes out,etc.   Right knee pain    inj [facility] 11/2020   Shoulder arthritis    right shoulder Dr. Tiburcio Pea [facility]   Past Surgical History:  Procedure Laterality Date   ANAL RECTAL MANOMETRY N/A 03/08/2017   Procedure: ANO RECTAL MANOMETRY;  Surgeon: Andria Meuse, MD;  Location: [facility];  Service: General;  Laterality: N/A;   CARPAL TUNNEL RELEASE     COLONOSCOPY WITH PROPOFOL N/A 03/07/2017   Procedure: COLONOSCOPY WITH PROPOFOL;  Surgeon: Andria Meuse, MD;  Location: [facility];  Service: General;  Laterality: N/A;  finergy surgery     cut finger sewn back on   HAND SURGERY     ring finger left hand    HEMORRHOID SURGERY     10/03/18 exc of mixed int/ext hemorrhoids x 3 columns Dr. Elenore Rota   JOINT REPLACEMENT     left  hip 2005, 2010, right hip 2010    TOTAL HIP ARTHROPLASTY  04/23/2012   Procedure: TOTAL HIP ARTHROPLASTY ANTERIOR APPROACH;  Surgeon: Loanne Drilling, MD;  Location: [facility];  Service: Orthopedics;  Laterality: Right;   TOTAL HIP REVISION  04/23/2012   Procedure: TOTAL HIP REVISION;  Surgeon: Loanne Drilling, MD;  Location: [facility];  Service: Orthopedics;  Laterality: Left;   TOTAL KNEE ARTHROPLASTY Left 02/17/2018   Procedure: LEFT TOTAL KNEE ARTHROPLASTY;  Surgeon: Ollen Gross, MD;  Location: [facility];  Service: Orthopedics;   Laterality: Left;   Family History  Problem Relation Age of Onset   Hypertension Mother    Arthritis Mother    Heart disease Mother    Cancer Father        lung died age 26    Alcohol abuse Son    Cancer Other        colon caner    Social History   Socioeconomic History   Marital status: Married    Spouse name: Not on file   Number of children: Not on file   Years of education: Not on file   Highest education level: Not on file  Occupational History   Not on file  Tobacco Use   Smoking status: Never   Smokeless tobacco: Never  Vaping Use   Vaping Use: Never used  Substance and Sexual Activity   Alcohol use: Yes    Alcohol/week: 6.0 - 10.0 standard drinks    Types: 6 - 10 Standard drinks or equivalent per week    Comment: 1 bottle qhs most nights x 1.5 years prior to 03/2017   Drug use: No   Sexual activity: Yes  Other Topics Concern   Not on file  Social History Narrative   Lives with her husband.   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    16 chickens, 1 Science writer, 2 dogs like growing crops   Social Determinants of Corporate investment banker Strain: Low Risk    Difficulty of Paying Living Expenses: Not hard at all  Food Insecurity: No Food Insecurity   Worried About Programme researcher, broadcasting/film/video in the Last Year: Never true   Barista in the Last Year: Never true  Transportation Needs: No Transportation Needs   Lack of Transportation (Medical): No   Lack of Transportation (Non-Medical): No  Physical Activity: Not on file  Stress: No Stress Concern Present   Feeling of Stress : Not at all  Social Connections: Unknown   Frequency of Communication with Friends and Family: Not on file   Frequency of Social Gatherings with Friends and Family: Not on file   Attends Religious Services: Not  on file   Active Member of Clubs or Organizations: Not on file   Attends Banker Meetings: Not on file   Marital Status: Married    Tobacco Counseling Counseling given: Not Answered   Clinical Intake:  Pre-visit  preparation completed: Yes        Diabetes: No  How often do you need to have someone help you when you read instructions, pamphlets, or other written materials from your doctor or pharmacy?: 1 - Never  Interpreter Needed?: No      Activities of Daily Living    08/23/2021   10:10 AM  In your present state of health, do you have any difficulty performing the following activities:  Hearing? 0  Vision? 0  Difficulty concentrating or making decisions? 0  Walking or climbing stairs? 0  Dressing or bathing? 0  Doing errands, shopping? 0  Preparing Food and eating ? N  Using the Toilet? N  In the past six months, have you accidently leaked urine? Y  Do you have problems with loss of bowel control? N  Managing your Medications? N  Managing your Finances? N  Housekeeping or managing your Housekeeping? N    Patient Care Team: McLean-Scocuzza, Pasty Spillers, MD as PCP - General (Internal Medicine)  Indicate any recent Medical Services you may have received from other than [facility] providers in the past year (date may be approximate).     Assessment:   This is a routine wellness examination for Desiree Pittman.  Virtual Visit via Telephone Note  I connected with  Georgeann Oppenheim on 08/23/21 at  9:45 AM EDT by telephone and verified that I am speaking with the correct person using two identifiers.  Persons participating in the virtual visit: patient/Nurse Health Advisor   I discussed the limitations of performing an evaluation and management service by telehealth.  The patient expressed understanding and agreed to proceed. We continued and completed visit with audio only. Some vital signs may be absent or patient reported.   Hearing/Vision screen Hearing  Screening - Comments:: Patient is able to hear conversational tones without difficulty. No issues reported. Vision Screening - Comments:: Wears reader glasses  Dietary issues and exercise activities discussed: Current Exercise Habits: Home exercise routine, Intensity: Mild Healthy diet Good water intake   Goals Addressed               This Visit's Progress     Patient Stated     Maintain Healthy Lifestyle (pt-stated)        Stay active Healthy diet       Depression Screen    08/23/2021    9:58 AM 12/07/2020   11:29 AM 08/17/2020   11:23 AM 06/11/2019   10:55 AM 12/05/2018   11:09 AM 06/24/2017    8:48 AM 10/21/2015    9:35 AM  PHQ 2/9 Scores  PHQ - 2 Score 0 0 0 0 0 0 4  PHQ- 9 Score       16    Fall Risk    08/23/2021   10:10 AM 12/07/2020   11:29 AM 08/17/2020   11:27 AM 12/09/2019   11:06 AM 06/11/2019   10:55 AM  Fall Risk   Falls in the past year? 0 0 0 1 0  Number falls in past yr: 0 0 0 0   Injury with Fall?  0 0 1   Risk for fall due to :  No Fall Risks  History of fall(s)   Follow up Falls evaluation completed Falls evaluation completed Falls evaluation completed Falls evaluation completed     FALL RISK PREVENTION PERTAINING TO THE HOME: Home free of loose throw rugs in walkways, pet beds, electrical cords, etc? Yes  Adequate lighting in your home to reduce  risk of falls? Yes   ASSISTIVE DEVICES UTILIZED TO PREVENT FALLS: Life alert? No  Use of a cane, walker or w/c? No   TIMED UP AND GO: Was the test performed? No .   Cognitive Function:  Patient is alert and oriented x3.      Immunizations Immunization History  Administered Date(s) Administered   Fluad Quad(high Dose 65+) 02/04/2020   Influenza,inj,quad, With Preservative 01/12/2017   Influenza-Unspecified 02/07/2017, 02/12/2019   Pneumococcal Conjugate-13 10/16/2017   Pneumococcal Polysaccharide-23 01/14/2019   Tdap 06/06/2007, 06/24/2017   Shingrix Completed?: No.    Education has been  provided regarding the importance of this vaccine. Patient has been advised to call insurance company to determine out of pocket expense if they have not yet received this vaccine. Advised may also receive vaccine at local pharmacy or Health Dept. Verbalized acceptance and understanding.  Screening Tests Health Maintenance  Topic Date Due   Zoster Vaccines- Shingrix (1 of 2) 11/22/2021 (Originally 01/15/1970)   INFLUENZA VACCINE  12/12/2021   MAMMOGRAM  05/31/2023   COLONOSCOPY (Pts 45-34yrs Insurance coverage will need to be confirmed)  03/08/2027   TETANUS/TDAP  06/25/2027   Pneumonia Vaccine 50+ Years old  Completed   Hepatitis C Screening  Completed   HPV VACCINES  Aged Out   FOOT EXAM  Discontinued   HEMOGLOBIN A1C  Discontinued   OPHTHALMOLOGY EXAM  Discontinued   DEXA SCAN  Discontinued   COVID-19 Vaccine  Discontinued   Health Maintenance There are no preventive care reminders to display for this patient.  Bone density- discontinued per patient.   Lung Cancer Screening: (Low Dose CT Chest recommended if Age 30-80 years, 30 pack-year currently smoking OR have quit w/in 15years.) does not qualify.   Vision Screening: Recommended annual ophthalmology exams for early detection of glaucoma and other disorders of the eye.  Dental Screening: Recommended annual dental exams for proper oral hygiene  Community Resource Referral / Chronic Care Management: CRR required this visit?  No   CCM required this visit?  No      Plan:   Keep all routine maintenance appointments.   I have personally reviewed and noted the following in the patient's chart:   Medical and social history Use of alcohol, tobacco or illicit drugs  Current medications and supplements including opioid prescriptions.  Functional ability and status Nutritional status Physical activity Advanced directives List of other physicians Hospitalizations, surgeries, and ER visits in previous 12  months Vitals Screenings to include cognitive, depression, and falls Referrals and appointments  In addition, I have reviewed and discussed with patient certain preventive protocols, quality metrics, and best practice recommendations. A written personalized care plan for preventive services as well as general preventive health recommendations were provided to patient.     Ashok Pall, LPN   1/61/0960

## 2021-08-30 ENCOUNTER — Other Ambulatory Visit (INDEPENDENT_AMBULATORY_CARE_PROVIDER_SITE_OTHER): Payer: PPO

## 2021-08-30 DIAGNOSIS — I10 Essential (primary) hypertension: Secondary | ICD-10-CM | POA: Diagnosis not present

## 2021-08-30 DIAGNOSIS — E559 Vitamin D deficiency, unspecified: Secondary | ICD-10-CM

## 2021-08-30 DIAGNOSIS — E538 Deficiency of other specified B group vitamins: Secondary | ICD-10-CM | POA: Diagnosis not present

## 2021-08-30 DIAGNOSIS — E785 Hyperlipidemia, unspecified: Secondary | ICD-10-CM | POA: Diagnosis not present

## 2021-08-30 LAB — VITAMIN D 25 HYDROXY (VIT D DEFICIENCY, FRACTURES): VITD: 37.26 ng/mL (ref 30.00–100.00)

## 2021-08-30 LAB — LIPID PANEL
Cholesterol: 209 mg/dL — ABNORMAL HIGH (ref 0–200)
HDL: 88.2 mg/dL (ref >39.00)
LDL Cholesterol: 105 mg/dL — ABNORMAL HIGH (ref 0–99)
NonHDL: 120.78
Total CHOL/HDL Ratio: 2
Triglycerides: 78 mg/dL (ref 0.0–149.0)
VLDL: 15.6 mg/dL (ref 0.0–40.0)

## 2021-08-30 LAB — VITAMIN B12: Vitamin B-12: 606 pg/mL (ref 211–911)

## 2021-09-01 ENCOUNTER — Encounter: Payer: Self-pay | Admitting: Urology

## 2021-09-01 ENCOUNTER — Ambulatory Visit: Payer: PPO | Admitting: Urology

## 2021-09-01 VITALS — BP 130/70 | HR 84 | Ht 65.0 in | Wt 122.0 lb

## 2021-09-01 DIAGNOSIS — R399 Unspecified symptoms and signs involving the genitourinary system: Secondary | ICD-10-CM

## 2021-09-01 DIAGNOSIS — R8281 Pyuria: Secondary | ICD-10-CM | POA: Diagnosis not present

## 2021-09-01 DIAGNOSIS — R319 Hematuria, unspecified: Secondary | ICD-10-CM | POA: Diagnosis not present

## 2021-09-01 LAB — MICROSCOPIC EXAMINATION: Bacteria, UA: NONE SEEN

## 2021-09-01 LAB — URINALYSIS, COMPLETE
Bilirubin, UA: NEGATIVE
Glucose, UA: NEGATIVE
Ketones, UA: NEGATIVE
Nitrite, UA: NEGATIVE
Protein,UA: NEGATIVE
Specific Gravity, UA: 1.025 (ref 1.005–1.030)
Urobilinogen, Ur: 0.2 mg/dL (ref 0.2–1.0)
pH, UA: 6 (ref 5.0–7.5)

## 2021-09-01 NOTE — Progress Notes (Signed)
? ?  09/01/21 ? ?CC:  ?Chief Complaint  ?Patient presents with  ? Cysto  ? ? ?HPI: Refer to my prior note 08/10/2021.  Urine culture did grow low levels E. coli/Pseudomonas though she was asymptomatic.  No symptoms today except for mild pressure.  She is taking D-mannose.  UA today is clear ? ?Blood pressure 130/70, pulse 84, height '5\' 5"'$  (1.651 m), weight 122 lb (55.3 kg). ?NED. A&Ox3.   ?No respiratory distress   ?Abd soft, NT, ND ?Atrophic external genitalia with patent urethral meatus ? ?Cystoscopy Procedure Note ? ?Patient identification was confirmed, informed consent was obtained, and patient was prepped using Betadine solution.  Lidocaine jelly was administered per urethral meatus.   ? ?Procedure: ?- Flexible cystoscope introduced, without any difficulty.   ?- Thorough search of the bladder revealed: ?   normal urethral meatus ?   normal urothelium ?   no stones ?   no ulcers  ?   no tumors ?   no urethral polyps ?   no trabeculation ? ?- Ureteral orifices were normal in position and appearance. ? ?Post-Procedure: ?- Patient tolerated the procedure well ? ?Assessment/ Plan: ?Intermittent voiding symptoms with pyuria but no culture documentation ?Cipro 500 mg x 1 dose post cystoscopy with last urine culture showing colonization.  She indicated she has Cipro at home and will take ?Continue D-mannose.  We discussed the dose for UTI prevention is 2 g daily or 1 g twice daily ?She will follow-up as needed for worsening voiding symptoms ? ? ? ?Abbie Sons, MD ? ?

## 2021-09-05 ENCOUNTER — Ambulatory Visit (INDEPENDENT_AMBULATORY_CARE_PROVIDER_SITE_OTHER): Payer: PPO | Admitting: Internal Medicine

## 2021-09-05 ENCOUNTER — Encounter: Payer: Self-pay | Admitting: Internal Medicine

## 2021-09-05 VITALS — BP 128/78 | HR 67 | Temp 98.1°F | Resp 14 | Ht 65.0 in | Wt 121.8 lb

## 2021-09-05 DIAGNOSIS — I10 Essential (primary) hypertension: Secondary | ICD-10-CM | POA: Diagnosis not present

## 2021-09-05 DIAGNOSIS — R197 Diarrhea, unspecified: Secondary | ICD-10-CM

## 2021-09-05 DIAGNOSIS — Z1231 Encounter for screening mammogram for malignant neoplasm of breast: Secondary | ICD-10-CM

## 2021-09-05 DIAGNOSIS — Z23 Encounter for immunization: Secondary | ICD-10-CM | POA: Diagnosis not present

## 2021-09-05 DIAGNOSIS — Z0100 Encounter for examination of eyes and vision without abnormal findings: Secondary | ICD-10-CM

## 2021-09-05 MED ORDER — HYDROCHLOROTHIAZIDE 12.5 MG PO TABS: 12.5000 mg | ORAL_TABLET | Freq: Every day | ORAL | 3 refills | Status: DC

## 2021-09-05 MED ORDER — SHINGRIX 50 MCG/0.5ML IM SUSR: 0.5000 mL | Freq: Once | INTRAMUSCULAR | 1 refills | Status: AC

## 2021-09-05 MED ORDER — LOSARTAN POTASSIUM 50 MG PO TABS: 50.0000 mg | ORAL_TABLET | Freq: Every day | ORAL | 3 refills | Status: DC

## 2021-09-05 NOTE — Patient Instructions (Addendum)
?Refer GI due 02/2022 [facility] or [facility]  ?Let me know about referral 11/2021 you are due  ? ?Voltaren gel, aspercream with lidocaine  ?Arthritis gloves ? ?[facility] Dr. Wallace Going, [place], [place]  ? ?[facility] ? ?Phone Fax E-mail Address  ?[phone] Not available [address]   ? [place] [address]  ?   ?Specialties     ? ? ?Zoster Vaccine, Recombinant injection ?What is this medication? ?ZOSTER VACCINE (ZOS ter vak SEEN) is a vaccine used to reduce the risk of getting shingles. This vaccine is not used to treat shingles or nerve pain from shingles. ?This medicine may be used for other purposes; ask your health care provider or pharmacist if you have questions. ?COMMON BRAND NAME(S): SHINGRIX ?What should I tell my care team before I take this medication? ?They need to know if you have any of these conditions: ?cancer ?immune system problems ?an unusual or allergic reaction to Zoster vaccine, other medications, foods, dyes, or preservatives ?pregnant or trying to get pregnant ?breast-feeding ?How should I use this medication? ?This vaccine is injected into a muscle. It is given by a health care provider. ?A copy of Vaccine Information Statements will be given before each vaccination. Be sure to read this information carefully each time. This sheet may change often. ?Talk to your health care provider about the use of this vaccine in children. This vaccine is not approved for use in children. ?Overdosage: If you think you have taken too much of this medicine contact a poison control center or emergency room at once. ?NOTE: This medicine is only for you. Do not share this medicine with others. ?What if I miss a dose? ?Keep appointments for follow-up (booster) doses. It is important not to miss your dose. Call your health care provider if you are unable to keep an appointment. ?What may interact with this medication? ?medicines that suppress your immune system ?medicines  to treat cancer ?steroid medicines like prednisone or cortisone ?This list may not describe all possible interactions. Give your health care provider a list of all the medicines, herbs, non-prescription drugs, or dietary supplements you use. Also tell them if you smoke, drink alcohol, or use illegal drugs. Some items may interact with your medicine. ?What should I watch for while using this medication? ?Visit your health care provider regularly. ?This vaccine, like all vaccines, may not fully protect everyone. ?What side effects may I notice from receiving this medication? ?Side effects that you should report to your doctor or health care professional as soon as possible: ?allergic reactions (skin rash, itching or hives; swelling of the face, lips, or tongue) ?trouble breathing ?Side effects that usually do not require medical attention (report these to your doctor or health care professional if they continue or are bothersome): ?chills ?headache ?fever ?nausea ?pain, redness, or irritation at site where injected ?tiredness ?vomiting ?This list may not describe all possible side effects. Call your doctor for medical advice about side effects. You may report side effects to FDA at 1-800-FDA-1088. ?Where should I keep my medication? ?This vaccine is only given by a health care provider. It will not be stored at home. ?NOTE: This sheet is a summary. It may not cover all possible information. If you have questions about this medicine, talk to your doctor, pharmacist, or health care provider. ?? 2023 Elsevier/Gold Standard (2021-03-31 00:00:00) ? ?Cholesterol Content in Foods ?Cholesterol is a waxy, fat-like substance that helps to carry fat in the blood. The body needs  cholesterol in small amounts, but too much cholesterol can cause damage to the arteries and heart. ?What foods have cholesterol? ? ?Cholesterol is found in animal-based foods, such as meat, seafood, and dairy. Generally, low-fat dairy and lean meats have  less cholesterol than full-fat dairy and fatty meats. The milligrams of cholesterol per serving (mg per serving) of common cholesterol-containing foods are listed below. ?Meats and other proteins ?Egg -- one large whole egg has 186 mg. ?Veal shank -- 4 oz (113 g) has 141 mg. ?Lean ground [place] (93% lean) -- 4 oz (113 g) has 118 mg. ?Fat-trimmed lamb loin -- 4 oz (113 g) has 106 mg. ?Lean ground beef (90% lean) -- 4 oz (113 g) has 100 mg. ?Lobster -- 3.5 oz (99 g) has 90 mg. ?Pork loin chops -- 4 oz (113 g) has 86 mg. ?Canned salmon -- 3.5 oz (99 g) has 83 mg. ?Fat-trimmed beef top loin -- 4 oz (113 g) has 78 mg. ?Frankfurter -- 1 frank (3.5 oz or 99 g) has 77 mg. ?Crab -- 3.5 oz (99 g) has 71 mg. ?Roasted chicken without skin, white meat -- 4 oz (113 g) has 66 mg. ?Light bologna -- 2 oz (57 g) has 45 mg. ?Deli-cut [place] -- 2 oz (57 g) has 31 mg. ?Canned tuna -- 3.5 oz (99 g) has 31 mg. ?Berniece Salines -- 1 oz (28 g) has 29 mg. ?Oysters and mussels (raw) -- 3.5 oz (99 g) has 25 mg. ?Mackerel -- 1 oz (28 g) has 22 mg. ?Trout -- 1 oz (28 g) has 20 mg. ?Pork sausage -- 1 link (1 oz or 28 g) has 17 mg. ?Salmon -- 1 oz (28 g) has 16 mg. ?Tilapia -- 1 oz (28 g) has 14 mg. ?Dairy ?Soft-serve ice cream -- ? cup (4 oz or 86 g) has 103 mg. ?Whole-milk yogurt -- 1 cup (8 oz or 245 g) has 29 mg. ?Cheddar cheese -- 1 oz (28 g) has 28 mg. ?American cheese -- 1 oz (28 g) has 28 mg. ?Whole milk -- 1 cup (8 oz or 250 mL) has 23 mg. ?2% milk -- 1 cup (8 oz or 250 mL) has 18 mg. ?Cream cheese -- 1 tablespoon (Tbsp) (14.5 g) has 15 mg. ?Cottage cheese -- ? cup (4 oz or 113 g) has 14 mg. ?Low-fat (1%) milk -- 1 cup (8 oz or 250 mL) has 10 mg. ?Sour cream -- 1 Tbsp (12 g) has 8.5 mg. ?Low-fat yogurt -- 1 cup (8 oz or 245 g) has 8 mg. ?Nonfat Greek yogurt -- 1 cup (8 oz or 228 g) has 7 mg. ?Half-and-half cream -- 1 Tbsp (15 mL) has 5 mg. ?Fats and oils ?Cod liver oil -- 1 tablespoon (Tbsp) (13.6 g) has 82 mg. ?Butter -- 1 Tbsp (14 g) has 15  mg. ?Lard -- 1 Tbsp (12.8 g) has 14 mg. ?Bacon grease -- 1 Tbsp (12.9 g) has 14 mg. ?Mayonnaise -- 1 Tbsp (13.8 g) has 5-10 mg. ?Margarine -- 1 Tbsp (14 g) has 3-10 mg. ?The items listed above may not be a complete list of foods with cholesterol. Exact amounts of cholesterol in these foods may vary depending on specific ingredients and brands. Contact a dietitian for more information. ?What foods do not have cholesterol? ?Most plant-based foods do not have cholesterol unless you combine them with a food that has cholesterol. Foods without cholesterol include: ?Grains and cereals. ?Vegetables. ?Fruits. ?Vegetable oils, such as olive, canola, and sunflower oil. ?Legumes,  such as peas, beans, and lentils. ?Nuts and seeds. ?Egg whites. ?The items listed above may not be a complete list of foods that do not have cholesterol. Contact a dietitian for more information. ?Summary ?The body needs cholesterol in small amounts, but too much cholesterol can cause damage to the arteries and heart. ?Cholesterol is found in animal-based foods, such as meat, seafood, and dairy. Generally, low-fat dairy and lean meats have less cholesterol than full-fat dairy and fatty meats. ?This information is not intended to replace advice given to you by your health care provider. Make sure you discuss any questions you have with your health care provider. ?Document Revised: 09/09/2020 Document Reviewed: 09/09/2020 ?Elsevier Patient Education ? [place]. ? ?High Cholesterol ? ?High cholesterol is a condition in which the blood has high levels of a white, waxy substance similar to fat (cholesterol). The liver makes all the cholesterol that the body needs. The human body needs small amounts of cholesterol to help build cells. A person gets extra or excess cholesterol from the food that he or she eats. ?The blood carries cholesterol from the liver to the rest of the body. If you have high cholesterol, deposits (plaques) may build up on the  walls of your arteries. Arteries are the blood vessels that carry blood away from your heart. These plaques make the arteries narrow and stiff. ?Cholesterol plaques increase your risk for heart attack and

## 2021-09-05 NOTE — Progress Notes (Signed)
Chief Complaint  ?Patient presents with  ? Follow-up  ?  Discuss lab results, possibility of getting shingles shot today. Insurance stated this yr shingrix is covered in office or if you prefer pharmacy. Saw urology and they recommended pt start TrueYou caps which she has. Discuss referral for eye exam.   ? ?F/u  ?1. Htn controlled on losartan 50 and hctz 12.5 mg qd  ?2. Recurrent uti taking tru you supplements to prevent uti taking 2 caps daily and 4 caps with sxs max up to 6 per urology had diarrheal illness which resolved after taking increased dose of tumeric for arthritis but her husband had too not sure if diarrhea caused UTI  ?3. Rx shingrix  ?4. Referral to eye md ? ? ?Review of Systems  ?Constitutional:  Negative for weight loss.  ?HENT:  Negative for hearing loss.   ?Eyes:  Negative for blurred vision.  ?Respiratory:  Negative for shortness of breath.   ?Cardiovascular:  Negative for chest pain.  ?Gastrointestinal:  Negative for abdominal pain and blood in stool.  ?Genitourinary:  Negative for dysuria.  ?Musculoskeletal:  Negative for falls and joint pain.  ?Skin:  Negative for rash.  ?Neurological:  Negative for headaches.  ?Psychiatric/Behavioral:  Negative for depression.   ?Past Medical History:  ?Diagnosis Date  ? Abnormal Pap smear of cervix   ? Allergy   ? Anxiety   ? Arthritis   ? Blood in stool   ? colonoscopy 02/2017  ? Chicken pox   ? Colon polyps   ? Depression   ? Diabetes mellitus without complication ([place])   ? type 2  ? Diverticulitis   ? GERD (gastroesophageal reflux disease)   ? h/o ulcers ? GI   ? Glaucoma   ? borderline   ? Heart murmur   ? Hyperlipidemia   ? Hypertension   ? PONV (postoperative nausea and vomiting)   ? pull tubes out,etc.  ? Right knee pain   ? inj [facility] 11/2020  ? Shoulder arthritis   ? right shoulder Dr. Kenton Kingfisher [facility]  ? ?Past Surgical History:  ?Procedure Laterality Date  ? ANAL RECTAL MANOMETRY N/A 03/08/2017  ? Procedure: ANO RECTAL MANOMETRY;  Surgeon:  Ileana Roup, MD;  Location: [facility];  Service: General;  Laterality: N/A;  ? CARPAL TUNNEL RELEASE    ? COLONOSCOPY WITH PROPOFOL N/A 03/07/2017  ? Procedure: COLONOSCOPY WITH PROPOFOL;  Surgeon: Ileana Roup, MD;  Location: [facility];  Service: General;  Laterality: N/A;  ? finergy surgery    ? cut finger sewn back on  ? HAND SURGERY    ? ring finger left hand   ? HEMORRHOID SURGERY    ? 10/03/18 exc of mixed int/ext hemorrhoids x 3 columns Dr. Audie Clear  ? JOINT REPLACEMENT    ? left  hip 2005, 2010, right hip 2010   ? TOTAL HIP ARTHROPLASTY  04/23/2012  ? Procedure: TOTAL HIP ARTHROPLASTY ANTERIOR APPROACH;  Surgeon: Gearlean Alf, MD;  Location: [facility];  Service: Orthopedics;  Laterality: Right;  ? TOTAL HIP REVISION  04/23/2012  ? Procedure: TOTAL HIP REVISION;  Surgeon: Gearlean Alf, MD;  Location: [facility];  Service: Orthopedics;  Laterality: Left;  ? TOTAL KNEE ARTHROPLASTY Left 02/17/2018  ? Procedure: LEFT TOTAL KNEE ARTHROPLASTY;  Surgeon: Gaynelle Arabian, MD;  Location: [facility];  Service: Orthopedics;  Laterality: Left;  ? ?Family History  ?Problem Relation Age of Onset  ? Hypertension Mother   ? Arthritis  Mother   ? Heart disease Mother   ? Cancer Father   ?     lung died age 16   ? Alcohol abuse Son   ? Cancer Other   ?     colon caner   ? ?Social History  ? ?Socioeconomic History  ? Marital status: Married  ?  Spouse name: Not on file  ? Number of children: Not on file  ? Years of education: Not on file  ? Highest education level: Not on file  ?Occupational History  ? Not on file  ?Tobacco Use  ? Smoking status: Never  ? Smokeless tobacco: Never  ?Vaping Use  ? Vaping Use: Never used  ?Substance and Sexual Activity  ? Alcohol use: Yes  ?  Alcohol/week: 6.0 - 10.0 standard drinks  ?  Types: 6 - 10 Standard drinks or equivalent per week  ?  Comment: 1 bottle qhs most nights x 1.5 years prior to 03/2017  ? Drug use: No  ? Sexual activity: Yes  ?Other Topics Concern  ? Not on file   ?Social History Narrative  ? Lives with her husband.  ? Her 67 year old son lived with them until 07-28-15, when she found him dead in their home of a self-inflicted GSW/suicide   ? 1 living daughter   ? 2 grandsons   ? 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 16 chickens, 1 royster, 2 dogs like growing crops  ? ?Social Determinants of Health  ? ?Financial Resource Strain: Low Risk   ? Difficulty of Paying Living Expenses: Not hard at all  ?Food Insecurity: No Food Insecurity  ? Worried About Charity fundraiser in the Last Year: Never true  ? Ran Out of Food in the Last Year: Never true  ?Transportation Needs: No Transportation Needs  ? Lack of Transportation (Medical): No  ? Lack of Transportation (Non-Medical): No  ?Physical Activity: Not on file  ?Stress: No Stress Concern Present  ? Feeling of Stress : Not at all  ?Social Connections: Unknown  ? Frequency of Communication with Friends and Family: Not on file  ? Frequency of Social Gatherings with Friends and Family: Not on file  ? Attends Religious Services: Not on file  ? Active Member of Clubs or Organizations: Not on file  ? Attends Archivist Meetings: Not on file  ? Marital Status: Married  ?Intimate Partner Violence: Not At Risk  ? Fear of Current or Ex-Partner: No  ? Emotionally Abused: No  ? Physically Abused: No  ? Sexually Abused: No  ? ?Current Meds  ?Medication Sig  ? NON FORMULARY Tru you 2 caps daily if sxs 4 caps daily max 6 caps  ? Zoster Vaccine Adjuvanted [facility]) injection Inject 0.5 mLs into the muscle once for 1 dose. X 2 doses  ? [DISCONTINUED] hydrochlorothiazide (HYDRODIURIL) 12.5 MG tablet Take 1 tablet (12.5 mg total) by mouth daily.  ? [DISCONTINUED] losartan (COZAAR) 50 MG tablet Take 1 tablet (50 mg total) by mouth daily.  ? ?Allergies  ?Allergen Reactions  ? Latex   ?  Skin irritation   ? Metformin And Related   ?  Diarrhea ?  ? Propofol Nausea And Vomiting  ?  Can do with nausea meds  ? ?Recent  Results (from the past 07/28/58 hour(s))  ?POCT Urinalysis Dipstick     Status: Abnormal  ? Collection Time: 07/31/21 10:14 AM  ?Result Value Ref Range  ?  Color, UA dark yellow   ? Clarity, UA slightly cloudy   ? Glucose, UA Negative Negative  ? Bilirubin, UA negative   ? Ketones, UA negative   ? Spec Grav, UA >=1.030 (A) 1.010 - 1.025  ? Blood, UA moderate   ? pH, UA 6.0 5.0 - 8.0  ? Protein, UA Positive (A) Negative  ? Urobilinogen, UA 0.2 0.2 or 1.0 E.U./dL  ? Nitrite, UA positive   ? Leukocytes, UA Small (1+) (A) Negative  ? Appearance    ? Odor    ?Urine Culture     Status: Abnormal  ? Collection Time: 07/31/21 10:39 AM  ? Specimen: Urine  ?Result Value Ref Range  ? MICRO NUMBER: [id number]   ? SPECIMEN QUALITY: Adequate   ? Sample Source NOT GIVEN   ? STATUS: FINAL   ? ISOLATE 1: Escherichia coli (A)   ?  Comment: Greater than 100,000 CFU/mL of Escherichia coli  ?    Susceptibility  ? Escherichia coli - URINE CULTURE, REFLEX  ?  AMOX/CLAVULANIC 4 Sensitive   ?  AMPICILLIN 4 Sensitive   ?  AMPICILLIN/SULBACTAM <=2 Sensitive   ?  CEFAZOLIN* <=4 Not Reportable   ?   * For infections other than uncomplicated UTI ?caused by E. coli, K. pneumoniae or P. mirabilis: ?Cefazolin is resistant if MIC > or = 8 mcg/mL. ?(Distinguishing susceptible versus intermediate ?for isolates with MIC < or = 4 mcg/mL requires ?additional testing.) ?For uncomplicated UTI caused by E. coli, ?K. pneumoniae or P. mirabilis: Cefazolin is ?susceptible if MIC <32 mcg/mL and predicts ?susceptible to the oral agents cefaclor, cefdinir, ?cefpodoxime, cefprozil, cefuroxime, cephalexin ?and loracarbef. ?  ?  CEFTAZIDIME <=1 Sensitive   ?  CEFEPIME <=1 Sensitive   ?  CEFTRIAXONE <=1 Sensitive   ?  CIPROFLOXACIN <=0.25 Sensitive   ?  LEVOFLOXACIN <=0.12 Sensitive   ?  GENTAMICIN <=1 Sensitive   ?  IMIPENEM <=0.25 Sensitive   ?  NITROFURANTOIN <=16 Sensitive   ?  PIP/TAZO <=4 Sensitive   ?  TOBRAMYCIN <=1 Sensitive   ?  TRIMETH/SULFA* <=20 Sensitive   ?    * For infections other than uncomplicated UTI ?caused by E. coli, K. pneumoniae or P. mirabilis: ?Cefazolin is resistant if MIC > or = 8 mcg/mL. ?(Distinguishing susceptible versus intermediate ?for isolates

## 2021-10-19 ENCOUNTER — Telehealth: Payer: Self-pay | Admitting: Internal Medicine

## 2021-10-19 NOTE — Telephone Encounter (Signed)
Pt called in stating her diarrhea has come back and she goes to the bathroom 3 to 5 times a day. Pt states its not solid but liquid and pt stated the provider said to call back when she has it again. Pt does not want to wait three weeks again for it to go away

## 2021-10-23 DIAGNOSIS — K529 Noninfective gastroenteritis and colitis, unspecified: Secondary | ICD-10-CM | POA: Insufficient documentation

## 2021-10-23 DIAGNOSIS — R197 Diarrhea, unspecified: Secondary | ICD-10-CM | POA: Insufficient documentation

## 2021-10-23 NOTE — Addendum Note (Signed)
Addended by: Orland Mustard on: 10/23/2021 12:11 AM   Modules accepted: Orders

## 2021-10-25 ENCOUNTER — Telehealth: Payer: Self-pay | Admitting: Internal Medicine

## 2021-10-25 NOTE — Telephone Encounter (Signed)
Patient called and said the referral to Dr Haig Prophet was sent with wrong insurance. Patient's insurance is [employer].

## 2021-10-27 NOTE — Telephone Encounter (Signed)
Patient called to check on referral ?  °

## 2021-10-30 NOTE — Telephone Encounter (Signed)
Patient states she is following-up on referral. Patient states we sent her referral to Dr. Andrey Farmer, a gastroenterologist, but we sent it with her insurance from two years ago.  Patient states her current insurance is [employer].  Patient states her current insurance said she does not have to have a referral, but Dr. Drinda Butts office told her that a referral is required.  Patient states she would like for [place] to send the referral to Dr. Drinda Butts office as soon as possible, as she does not feel well.  I did let patient know that our referral coordinator is out today, but she will return tomorrow.

## 2021-11-01 NOTE — Telephone Encounter (Signed)
Pt called stating that the GI can not see her until September and want to know is there another that she can be referred out to. Pt want to know if the provider can recommend any medication that can help her.

## 2021-11-03 ENCOUNTER — Ambulatory Visit: Payer: PPO | Admitting: Family

## 2022-02-06 IMAGING — MG MM DIGITAL SCREENING BILAT W/ TOMO AND CAD
8 series · 9 of 24 positions shown · non-contrast
Comparison: Previous exam(s).

CLINICAL DATA: Screening.

EXAM:
DIGITAL SCREENING BILATERAL MAMMOGRAM WITH TOMOSYNTHESIS AND CAD
TECHNIQUE: Bilateral screening digital craniocaudal and mediolateral oblique
mammograms were obtained. Bilateral screening digital breast
tomosynthesis was performed. The images were evaluated with
computer-aided detection.

[R CC synth-2D]
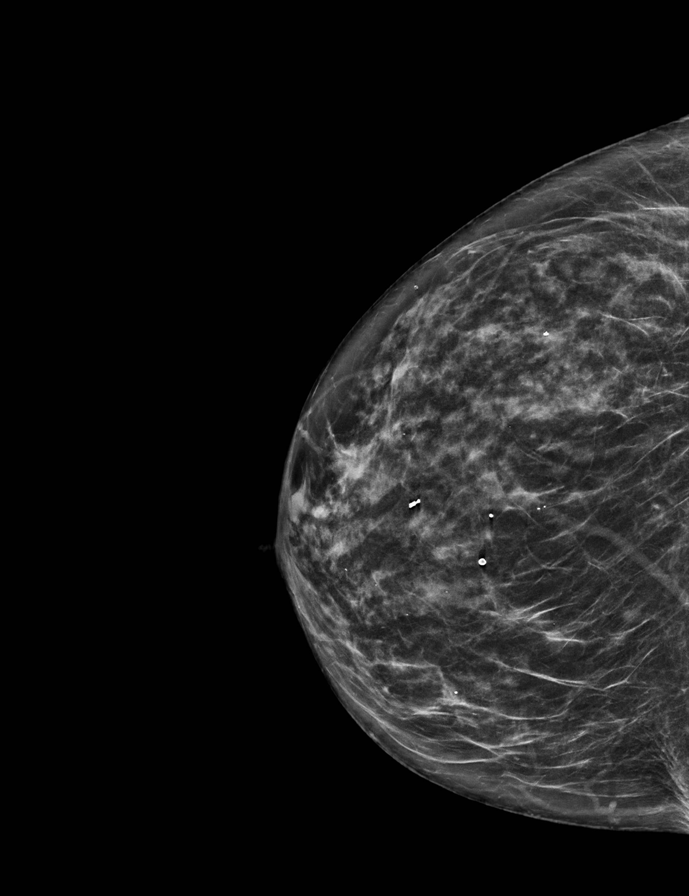

[R MLO synth-2D]
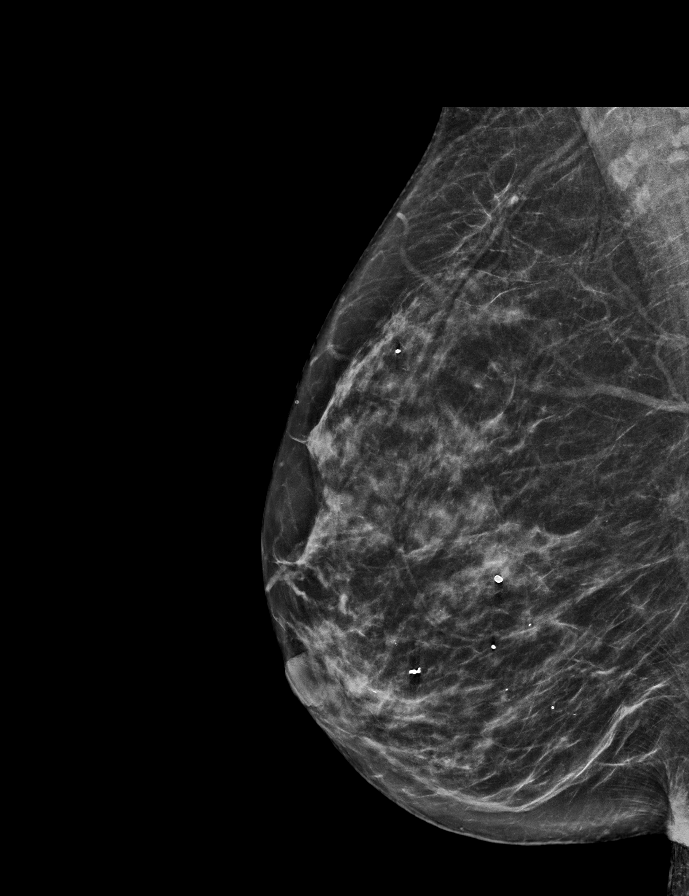

[L MLO synth-2D]
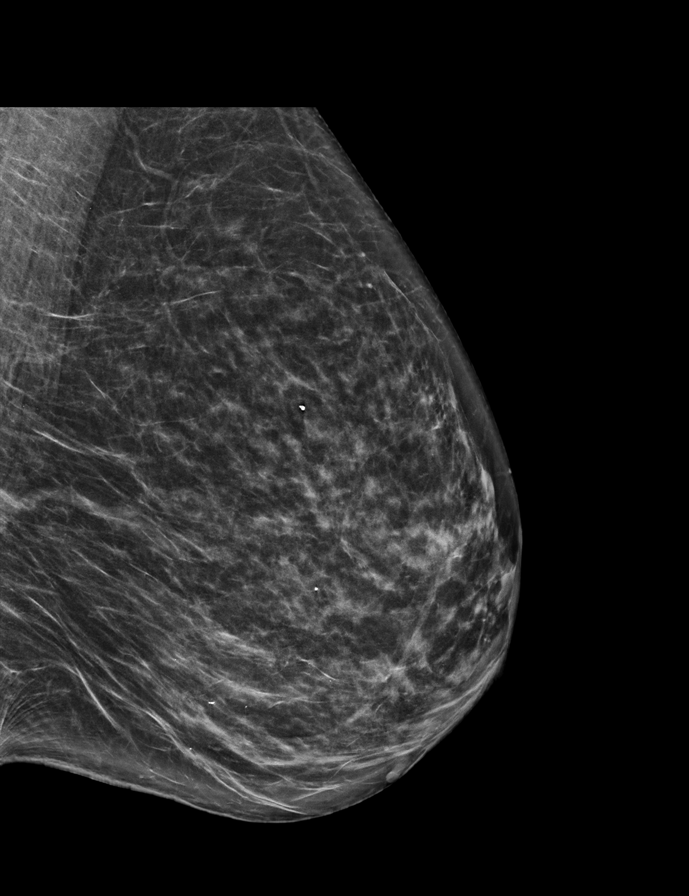

[L CC synth-2D]
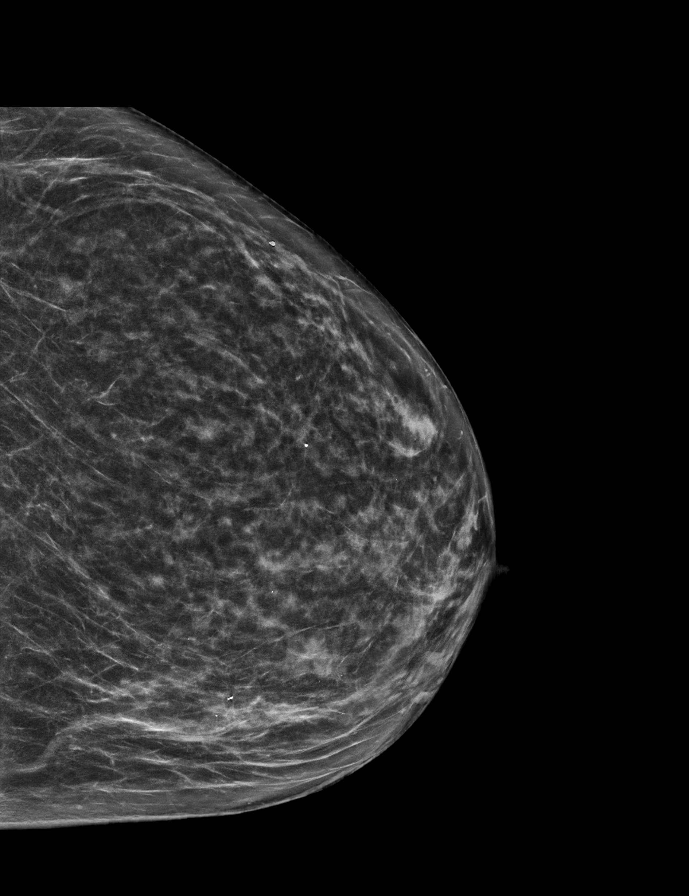

[L CC tomo · 2 of 59 frames shown]
[frame 20/59]
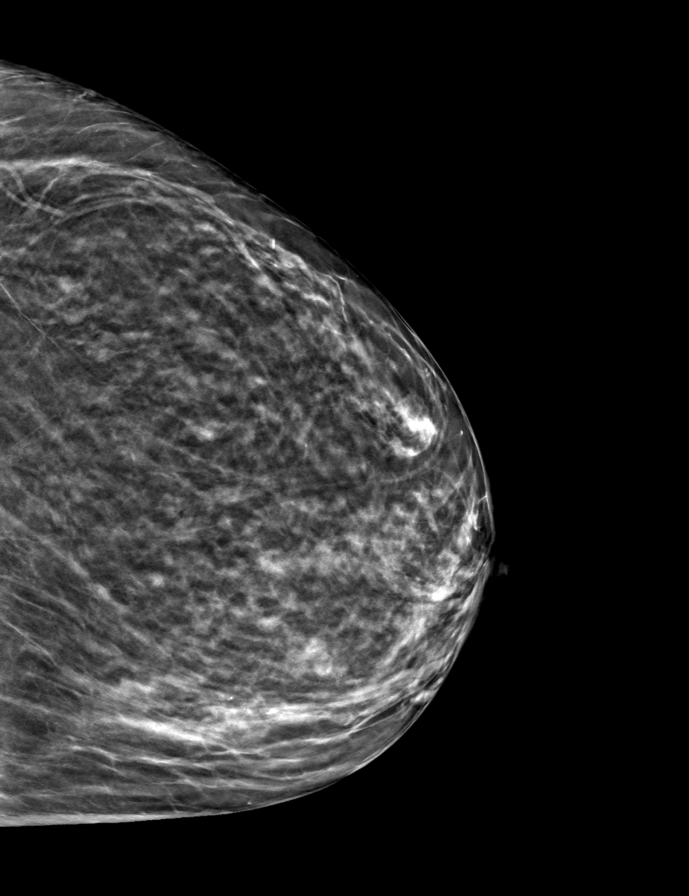
[frame 30/59]
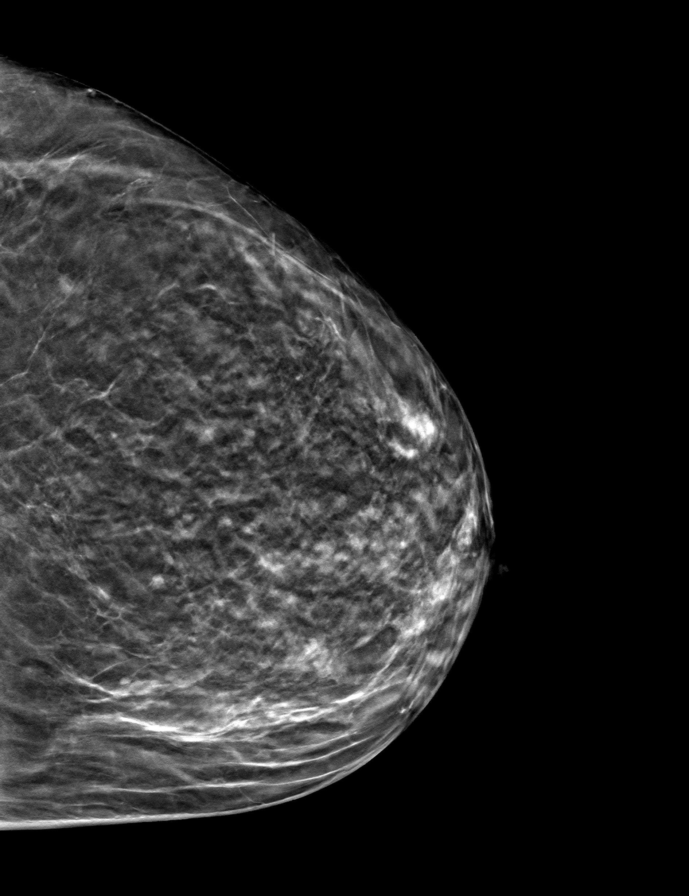

[L MLO tomo · tomo slice 32/63.0]
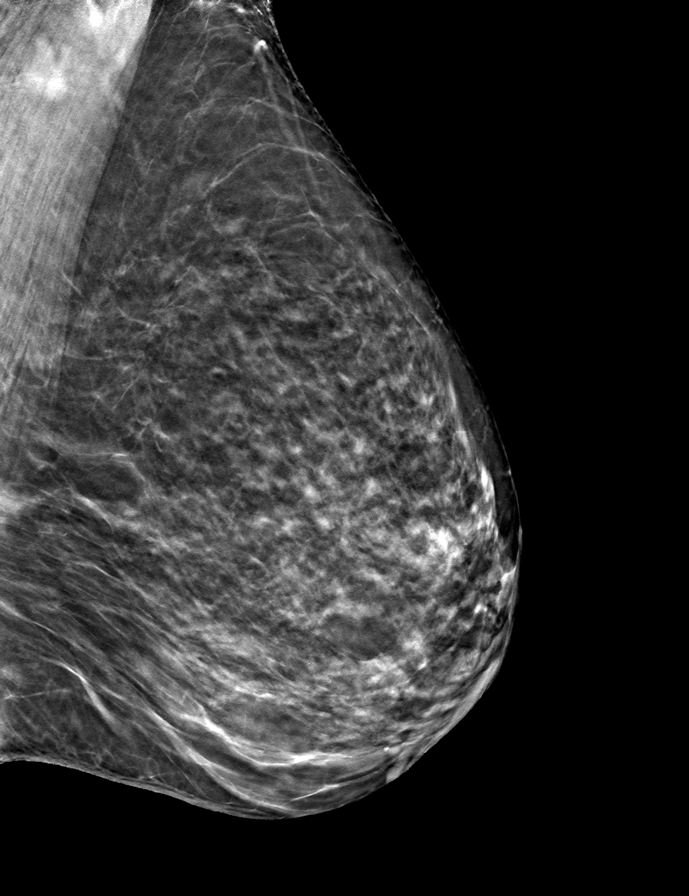

[R MLO tomo · tomo slice 32/63.0]
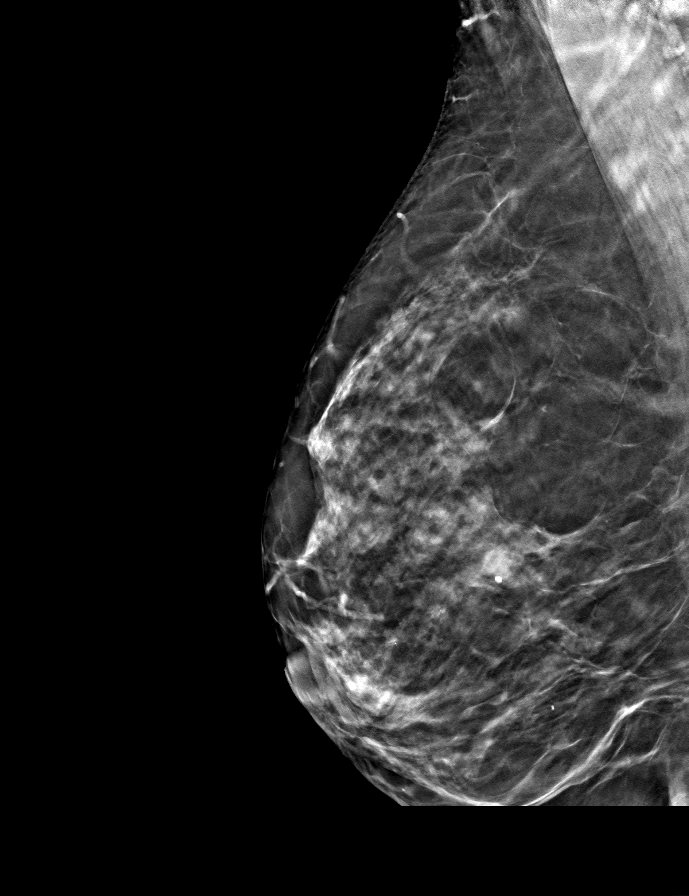

[R CC tomo · tomo slice 31/60.0]
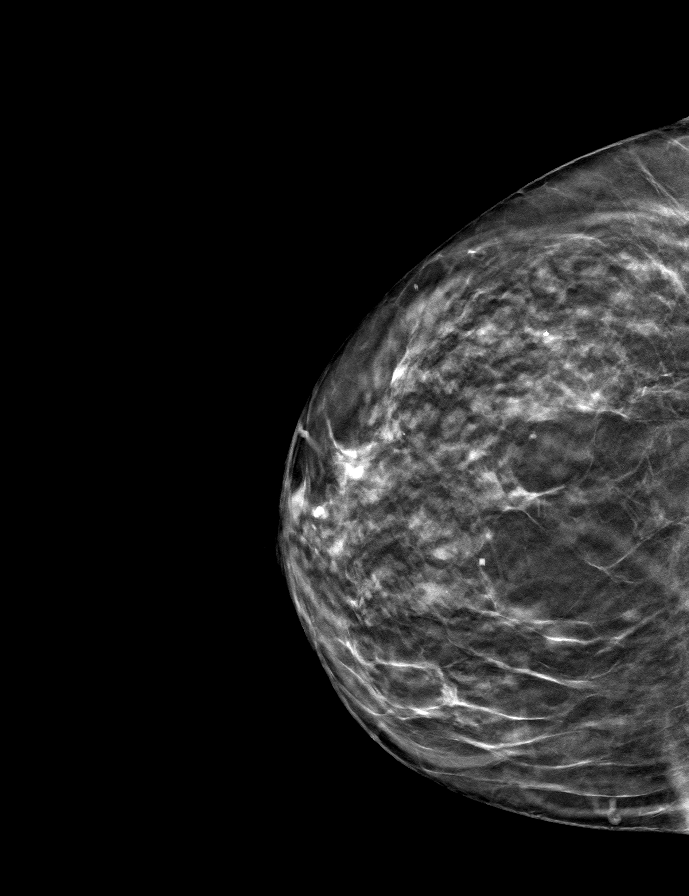

[9 of 24 positions shown; findings below may reference images not displayed]

ACR Breast Density Category c: The breast tissue is heterogeneously
dense, which may obscure small masses.
FINDINGS: There are no findings suspicious for malignancy.
IMPRESSION: No mammographic evidence of malignancy. A result letter of this
screening mammogram will be mailed directly to the patient.

RECOMMENDATION:
Screening mammogram in one year. (Code:Q3-W-BC3)

BI-RADS CATEGORY  1: Negative.

## 2022-02-22 DIAGNOSIS — H2513 Age-related nuclear cataract, bilateral: Secondary | ICD-10-CM | POA: Diagnosis not present

## 2022-02-22 DIAGNOSIS — H40023 Open angle with borderline findings, high risk, bilateral: Secondary | ICD-10-CM | POA: Diagnosis not present

## 2022-02-22 DIAGNOSIS — H524 Presbyopia: Secondary | ICD-10-CM | POA: Diagnosis not present

## 2022-02-22 DIAGNOSIS — H52213 Irregular astigmatism, bilateral: Secondary | ICD-10-CM | POA: Diagnosis not present

## 2022-02-22 DIAGNOSIS — H40053 Ocular hypertension, bilateral: Secondary | ICD-10-CM | POA: Diagnosis not present

## 2022-03-02 ENCOUNTER — Other Ambulatory Visit: Payer: Self-pay | Admitting: Internal Medicine

## 2022-09-03 ENCOUNTER — Encounter: Payer: Self-pay | Admitting: Internal Medicine

## 2022-09-05 ENCOUNTER — Ambulatory Visit: Payer: PPO

## 2022-11-27 ENCOUNTER — Telehealth: Payer: Self-pay | Admitting: Family Medicine

## 2022-11-27 DIAGNOSIS — I10 Essential (primary) hypertension: Secondary | ICD-10-CM

## 2022-11-27 NOTE — Telephone Encounter (Signed)
Prescription Request  11/27/2022  LOV: Visit date not found  What is the name of the medication or equipment? hydrochlorothiazide   Have you contacted your pharmacy to request a refill? Yes   Which pharmacy would you [facility] this sent to?  [facility] [place]  Patient notified that their request is being sent to the clinical staff for review and that they should receive a response within 2 business days.   Please advise at [facility] [phone]

## 2022-11-28 MED ORDER — HYDROCHLOROTHIAZIDE 12.5 MG PO TABS: 12.5000 mg | ORAL_TABLET | Freq: Every day | ORAL | 0 refills | Status: DC

## 2022-11-28 NOTE — Telephone Encounter (Signed)
 Refill sent.

## 2022-12-12 ENCOUNTER — Encounter (INDEPENDENT_AMBULATORY_CARE_PROVIDER_SITE_OTHER): Payer: Self-pay

## 2022-12-26 DIAGNOSIS — M1711 Unilateral primary osteoarthritis, right knee: Secondary | ICD-10-CM | POA: Diagnosis not present

## 2022-12-26 DIAGNOSIS — M1712 Unilateral primary osteoarthritis, left knee: Secondary | ICD-10-CM | POA: Diagnosis not present

## 2022-12-27 ENCOUNTER — Encounter (INDEPENDENT_AMBULATORY_CARE_PROVIDER_SITE_OTHER): Payer: Self-pay

## 2023-01-03 ENCOUNTER — Encounter: Payer: PPO | Admitting: Family Medicine

## 2023-02-05 ENCOUNTER — Ambulatory Visit (INDEPENDENT_AMBULATORY_CARE_PROVIDER_SITE_OTHER): Payer: PPO | Admitting: Family Medicine

## 2023-02-05 ENCOUNTER — Encounter: Payer: Self-pay | Admitting: Family Medicine

## 2023-02-05 VITALS — BP 124/70 | HR 68 | Temp 97.9°F | Resp 16 | Ht 65.0 in | Wt 118.0 lb

## 2023-02-05 DIAGNOSIS — E559 Vitamin D deficiency, unspecified: Secondary | ICD-10-CM | POA: Diagnosis not present

## 2023-02-05 DIAGNOSIS — E538 Deficiency of other specified B group vitamins: Secondary | ICD-10-CM

## 2023-02-05 DIAGNOSIS — E785 Hyperlipidemia, unspecified: Secondary | ICD-10-CM

## 2023-02-05 DIAGNOSIS — R7309 Other abnormal glucose: Secondary | ICD-10-CM | POA: Diagnosis not present

## 2023-02-05 DIAGNOSIS — N952 Postmenopausal atrophic vaginitis: Secondary | ICD-10-CM

## 2023-02-05 DIAGNOSIS — Z1231 Encounter for screening mammogram for malignant neoplasm of breast: Secondary | ICD-10-CM

## 2023-02-05 DIAGNOSIS — I10 Essential (primary) hypertension: Secondary | ICD-10-CM | POA: Diagnosis not present

## 2023-02-05 DIAGNOSIS — R300 Dysuria: Secondary | ICD-10-CM | POA: Diagnosis not present

## 2023-02-05 DIAGNOSIS — R8290 Unspecified abnormal findings in urine: Secondary | ICD-10-CM | POA: Diagnosis not present

## 2023-02-05 LAB — POC URINALSYSI DIPSTICK (AUTOMATED)
Bilirubin, UA: NEGATIVE
Glucose, UA: NEGATIVE
Ketones, UA: NEGATIVE
Nitrite, UA: NEGATIVE
Protein, UA: POSITIVE — AB
Spec Grav, UA: 1.025 (ref 1.010–1.025)
Urobilinogen, UA: 0.2 E.U./dL
pH, UA: 6 (ref 5.0–8.0)

## 2023-02-05 MED ORDER — ESTRADIOL 0.1 MG/GM VA CREA
1.0000 | TOPICAL_CREAM | Freq: Every day | VAGINAL | 12 refills | Status: AC
Start: 2023-02-05 — End: ?

## 2023-02-05 NOTE — Progress Notes (Signed)
SUBJECTIVE:   Chief Complaint  Patient presents with   Establish Care   HPI Presents to transfer care  Concerns for urine infection Symptoms of urinary frequency and dysuria. Some suprapubic pain. Denies any fevers, back pain, worsening blood in urine or vaginal discharge.  Takes Probiotics daily.  History of kidney stones.  Has hx of chronic hematuria and follows with Urology.  HTN Asymptomatic.  Takes Losartan 50 mg daily and Hydrochlorothiazide 12.5 mg daily.  Compliant with medication. BP at home 120's/70's.    PERTINENT PMH / PSH: Recurrent UTI As above   OBJECTIVE:  BP 124/70   Pulse 68   Temp 97.9 F (36.6 C)   Resp 16   Ht 5\' 5"  (1.651 m)   Wt 118 lb (53.5 kg)   SpO2 100%   BMI 19.64 kg/m    Physical Exam Vitals reviewed.  Constitutional:      General: She is not in acute distress.    Appearance: She is not ill-appearing.  HENT:     Head: Normocephalic.     Right Ear: Tympanic membrane, ear canal and external ear normal.     Left Ear: Tympanic membrane, ear canal and external ear normal.     Nose: Nose normal.     Mouth/Throat:     Mouth: Mucous membranes are moist.  Eyes:     Extraocular Movements: Extraocular movements intact.     Conjunctiva/sclera: Conjunctivae normal.     Pupils: Pupils are equal, round, and reactive to light.  Neck:     Thyroid: No thyromegaly or thyroid tenderness.     Vascular: No carotid bruit.  Cardiovascular:     Rate and Rhythm: Normal rate and regular rhythm.     Pulses: Normal pulses.     Heart sounds: Normal heart sounds.  Pulmonary:     Effort: Pulmonary effort is normal.     Breath sounds: Normal breath sounds.  Abdominal:     General: Bowel sounds are normal. There is no distension.     Palpations: Abdomen is soft.     Tenderness: There is no abdominal tenderness. There is no right CVA tenderness, left CVA tenderness, guarding or rebound.  Musculoskeletal:        General: Normal range of motion.      Cervical back: Normal range of motion.     Right lower leg: No edema.     Left lower leg: No edema.  Lymphadenopathy:     Cervical: No cervical adenopathy.  Skin:    Capillary Refill: Capillary refill takes less than 2 seconds.  Neurological:     General: No focal deficit present.     Mental Status: She is alert and oriented to person, place, and time. Mental status is at baseline.     Motor: No weakness.  Psychiatric:        Mood and Affect: Mood normal.        Behavior: Behavior normal.        Thought Content: Thought content normal.        Judgment: Judgment normal.        02/05/2023    2:02 PM 09/05/2021   10:12 AM 08/23/2021    9:58 AM 12/07/2020   11:29 AM 08/17/2020   11:23 AM  Depression screen PHQ 2/9  Decreased Interest 0 0 0 0 0  Down, Depressed, Hopeless 0 0 0 0 0  PHQ - 2 Score 0 0 0 0 0  Altered sleeping 0  Tired, decreased energy 0      Change in appetite 0      Feeling bad or failure about yourself  0      Trouble concentrating 0      Moving slowly or fidgety/restless 0      Suicidal thoughts 0      PHQ-9 Score 0      Difficult doing work/chores Not difficult at all          02/05/2023    2:02 PM  GAD 7 : Generalized Anxiety Score  Nervous, Anxious, on Edge 0  Control/stop worrying 0  Worry too much - different things 0  Trouble relaxing 0  Restless 0  Easily annoyed or irritable 0  Afraid - awful might happen 0  Total GAD 7 Score 0  Anxiety Difficulty Not difficult at all    ASSESSMENT/PLAN:  Burning with urination Assessment & Plan: Culture positive  Treated with Cefdinir 300 mg BID x 5 days Probiotics daily  Orders: -     POCT Urinalysis Dipstick (Automated) -     Lipid panel; Future  Encounter for screening mammogram for malignant neoplasm of breast -     3D Screening Mammogram, Left and Right; Future  Abnormal urinalysis -     Urine Culture  Essential hypertension Assessment & Plan: Chronic Well controlled Continue Losartan  50 mg daily Continue hydrochlorothiazide 12.5 mg daily Check Cmet  Orders: -     CBC with Differential/Platelet; Future -     Comprehensive metabolic panel; Future -     Microalbumin / creatinine urine ratio  Vitamin B 12 deficiency -     Vitamin B12; Future  Vitamin D deficiency -     VITAMIN D 25 Hydroxy (Vit-D Deficiency, Fractures); Future  Hyperlipidemia, unspecified hyperlipidemia type Assessment & Plan: Check fasting lipids  Orders: -     Lipid panel; Future  Abnormal glucose -     Hemoglobin A1c; Future  Vaginal atrophy Assessment & Plan: History recurrent UTI.  Suspect vaginal atrophy contributing to symptoms Trial estrace vaginal cream, pea sized amount on urethra nightly x 2 weeks then 2 times week  Orders: -     Estradiol; Place 1 Applicatorful vaginally at bedtime. Apply pea sized amount to urethral meatus at bedtime for 2 weeks then 2 times weekly.  Dispense: 42.5 g; Refill: 12   PDMP reviewed  Return if symptoms worsen or fail to improve, for PCP.  Dana Allan, MD

## 2023-02-05 NOTE — Patient Instructions (Addendum)
It was a pleasure meeting you today. Thank you for allowing me to take part in your health care.  Our goals for today as we discussed include:  Please schedule appointment for fasting blood work.  Sending urine for further evaluation  Recommend Shingles vaccine.  This is a 2 dose series and can be given at your local pharmacy.  Please talk to your pharmacist about this.   Use vaginal cream, a pea sized amount on the urethral meatus at night for 2 weeks then 2 times weekly  If you have any questions or concerns, please do not hesitate to call the office at [phone].  I look forward to our next visit and until then take care and stay safe.  Regards,   Dana Allan, MD   [facility]

## 2023-02-06 ENCOUNTER — Telehealth: Payer: Self-pay | Admitting: Family Medicine

## 2023-02-06 ENCOUNTER — Other Ambulatory Visit: Payer: PPO

## 2023-02-06 DIAGNOSIS — E538 Deficiency of other specified B group vitamins: Secondary | ICD-10-CM

## 2023-02-06 DIAGNOSIS — R300 Dysuria: Secondary | ICD-10-CM | POA: Diagnosis not present

## 2023-02-06 DIAGNOSIS — I10 Essential (primary) hypertension: Secondary | ICD-10-CM | POA: Diagnosis not present

## 2023-02-06 DIAGNOSIS — E559 Vitamin D deficiency, unspecified: Secondary | ICD-10-CM | POA: Diagnosis not present

## 2023-02-06 DIAGNOSIS — E785 Hyperlipidemia, unspecified: Secondary | ICD-10-CM | POA: Diagnosis not present

## 2023-02-06 DIAGNOSIS — R7309 Other abnormal glucose: Secondary | ICD-10-CM

## 2023-02-06 LAB — MICROALBUMIN / CREATININE URINE RATIO
Creatinine,U: 97 mg/dL
Microalb Creat Ratio: 13 mg/g (ref 0.0–30.0)
Microalb, Ur: 12.6 mg/dL — ABNORMAL HIGH (ref 0.0–1.9)

## 2023-02-06 LAB — VITAMIN D 25 HYDROXY (VIT D DEFICIENCY, FRACTURES): VITD: 49.3 ng/mL (ref 30.00–100.00)

## 2023-02-06 LAB — CBC WITH DIFFERENTIAL/PLATELET
Basophils Absolute: 0 10*3/uL (ref 0.0–0.1)
Basophils Relative: 0.3 % (ref 0.0–3.0)
Eosinophils Absolute: 0.2 10*3/uL (ref 0.0–0.7)
Eosinophils Relative: 3.3 % (ref 0.0–5.0)
HCT: 45.6 % (ref 36.0–46.0)
Hemoglobin: 15.1 g/dL — ABNORMAL HIGH (ref 12.0–15.0)
Lymphocytes Relative: 29.4 % (ref 12.0–46.0)
Lymphs Abs: 1.4 10*3/uL (ref 0.7–4.0)
MCHC: 33 g/dL (ref 30.0–36.0)
MCV: 107.5 fl — ABNORMAL HIGH (ref 78.0–100.0)
Monocytes Absolute: 0.6 10*3/uL (ref 0.1–1.0)
Monocytes Relative: 12.1 % — ABNORMAL HIGH (ref 3.0–12.0)
Neutro Abs: 2.7 10*3/uL (ref 1.4–7.7)
Neutrophils Relative %: 54.9 % (ref 43.0–77.0)
Platelets: 179 10*3/uL (ref 150.0–400.0)
RBC: 4.24 Mil/uL (ref 3.87–5.11)
RDW: 15.1 % (ref 11.5–15.5)
WBC: 4.9 10*3/uL (ref 4.0–10.5)

## 2023-02-06 LAB — LIPID PANEL
Cholesterol: 199 mg/dL (ref 0–200)
HDL: 99.1 mg/dL (ref >39.00)
LDL Cholesterol: 87 mg/dL (ref 0–99)
NonHDL: 99.88
Total CHOL/HDL Ratio: 2
Triglycerides: 64 mg/dL (ref 0.0–149.0)
VLDL: 12.8 mg/dL (ref 0.0–40.0)

## 2023-02-06 LAB — URINE CULTURE
MICRO NUMBER:: 15508772
SPECIMEN QUALITY:: ADEQUATE

## 2023-02-06 LAB — COMPREHENSIVE METABOLIC PANEL
ALT: 52 U/L — ABNORMAL HIGH (ref 0–35)
AST: 66 U/L — ABNORMAL HIGH (ref 0–37)
Albumin: 4 g/dL (ref 3.5–5.2)
Alkaline Phosphatase: 59 U/L (ref 39–117)
BUN: 13 mg/dL (ref 6–23)
CO2: 28 mEq/L (ref 19–32)
Calcium: 10.3 mg/dL (ref 8.4–10.5)
Chloride: 103 mEq/L (ref 96–112)
Creatinine, Ser: 0.67 mg/dL (ref 0.40–1.20)
GFR: 88.16 mL/min (ref >60.00)
Glucose, Bld: 92 mg/dL (ref 70–99)
Potassium: 4.2 mEq/L (ref 3.5–5.1)
Sodium: 141 mEq/L (ref 135–145)
Total Bilirubin: 0.7 mg/dL (ref 0.2–1.2)
Total Protein: 6.5 g/dL (ref 6.0–8.3)

## 2023-02-06 LAB — VITAMIN B12: Vitamin B-12: 920 pg/mL — ABNORMAL HIGH (ref 211–911)

## 2023-02-06 LAB — HEMOGLOBIN A1C: Hgb A1c MFr Bld: 5.3 % (ref 4.6–6.5)

## 2023-02-06 NOTE — Telephone Encounter (Signed)
Patient just asked can she get a refill on Mometasone furoate 0.1% Cream [id number] [place], [place] - [address] [place] [address] Phone: [phone]  Fax: [phone]

## 2023-02-07 ENCOUNTER — Encounter: Payer: Self-pay | Admitting: Family Medicine

## 2023-02-07 ENCOUNTER — Other Ambulatory Visit: Payer: Self-pay

## 2023-02-07 ENCOUNTER — Other Ambulatory Visit: Payer: Self-pay | Admitting: Family Medicine

## 2023-02-07 DIAGNOSIS — N3000 Acute cystitis without hematuria: Secondary | ICD-10-CM

## 2023-02-07 DIAGNOSIS — R7989 Other specified abnormal findings of blood chemistry: Secondary | ICD-10-CM

## 2023-02-07 DIAGNOSIS — D7589 Other specified diseases of blood and blood-forming organs: Secondary | ICD-10-CM

## 2023-02-07 DIAGNOSIS — R300 Dysuria: Secondary | ICD-10-CM

## 2023-02-07 MED ORDER — CEFDINIR 300 MG PO CAPS
300.0000 mg | ORAL_CAPSULE | Freq: Two times a day (BID) | ORAL | 0 refills | Status: AC
Start: 2023-02-07 — End: 2023-02-12

## 2023-02-07 MED ORDER — SACCHAROMYCES BOULARDII 250 MG PO CAPS
250.0000 mg | ORAL_CAPSULE | Freq: Every day | ORAL | 0 refills | Status: DC
Start: 2023-02-07 — End: 2023-03-14

## 2023-02-07 MED ORDER — MOMETASONE FUROATE 0.1 % EX CREA: 1.0000 | TOPICAL_CREAM | Freq: Every day | CUTANEOUS | 0 refills | Status: DC

## 2023-02-07 NOTE — Telephone Encounter (Signed)
Updated and sent in.

## 2023-02-13 ENCOUNTER — Encounter: Payer: Self-pay | Admitting: Family Medicine

## 2023-02-13 DIAGNOSIS — R8290 Unspecified abnormal findings in urine: Secondary | ICD-10-CM | POA: Insufficient documentation

## 2023-02-13 DIAGNOSIS — Z1231 Encounter for screening mammogram for malignant neoplasm of breast: Secondary | ICD-10-CM | POA: Insufficient documentation

## 2023-02-13 DIAGNOSIS — E538 Deficiency of other specified B group vitamins: Secondary | ICD-10-CM | POA: Insufficient documentation

## 2023-02-13 DIAGNOSIS — R300 Dysuria: Secondary | ICD-10-CM | POA: Insufficient documentation

## 2023-02-13 DIAGNOSIS — N952 Postmenopausal atrophic vaginitis: Secondary | ICD-10-CM | POA: Insufficient documentation

## 2023-02-13 DIAGNOSIS — R7309 Other abnormal glucose: Secondary | ICD-10-CM | POA: Insufficient documentation

## 2023-02-13 NOTE — Assessment & Plan Note (Signed)
Check fasting lipids 

## 2023-02-13 NOTE — Assessment & Plan Note (Signed)
Culture positive  Treated with Cefdinir 300 mg BID x 5 days Probiotics daily

## 2023-02-13 NOTE — Assessment & Plan Note (Addendum)
History recurrent UTI.  Suspect vaginal atrophy contributing to symptoms Trial estrace vaginal cream, pea sized amount on urethra nightly x 2 weeks then 2 times week

## 2023-02-13 NOTE — Assessment & Plan Note (Signed)
Chronic Well controlled Continue Losartan 50 mg daily Continue hydrochlorothiazide 12.5 mg daily Check Cmet

## 2023-02-23 ENCOUNTER — Other Ambulatory Visit: Payer: Self-pay | Admitting: Family Medicine

## 2023-02-23 ENCOUNTER — Other Ambulatory Visit: Payer: Self-pay | Admitting: Family

## 2023-02-23 DIAGNOSIS — I10 Essential (primary) hypertension: Secondary | ICD-10-CM

## 2023-02-26 ENCOUNTER — Ambulatory Visit
Admission: RE | Admit: 2023-02-26 | Discharge: 2023-02-26 | Disposition: A | Discharge status: Home / Self Care | Payer: PPO | Source: Ambulatory Visit | Attending: Family Medicine | Admitting: Family Medicine

## 2023-02-26 DIAGNOSIS — Z1231 Encounter for screening mammogram for malignant neoplasm of breast: Secondary | ICD-10-CM

## 2023-03-14 ENCOUNTER — Encounter: Payer: Self-pay | Admitting: Family Medicine

## 2023-03-14 ENCOUNTER — Ambulatory Visit (INDEPENDENT_AMBULATORY_CARE_PROVIDER_SITE_OTHER): Payer: PPO | Admitting: Family Medicine

## 2023-03-14 VITALS — BP 124/70 | HR 63 | Temp 98.4°F | Resp 16 | Ht 65.0 in | Wt 112.1 lb

## 2023-03-14 DIAGNOSIS — L309 Dermatitis, unspecified: Secondary | ICD-10-CM | POA: Diagnosis not present

## 2023-03-14 DIAGNOSIS — N952 Postmenopausal atrophic vaginitis: Secondary | ICD-10-CM | POA: Diagnosis not present

## 2023-03-14 DIAGNOSIS — K529 Noninfective gastroenteritis and colitis, unspecified: Secondary | ICD-10-CM

## 2023-03-14 DIAGNOSIS — L304 Erythema intertrigo: Secondary | ICD-10-CM | POA: Diagnosis not present

## 2023-03-14 DIAGNOSIS — F1010 Alcohol abuse, uncomplicated: Secondary | ICD-10-CM

## 2023-03-14 DIAGNOSIS — Z23 Encounter for immunization: Secondary | ICD-10-CM

## 2023-03-14 DIAGNOSIS — R197 Diarrhea, unspecified: Secondary | ICD-10-CM

## 2023-03-14 MED ORDER — MOMETASONE FUROATE 0.1 % EX SOLN
Freq: Every day | CUTANEOUS | 1 refills | Status: DC
Start: 2023-03-14 — End: 2023-04-29

## 2023-03-14 MED ORDER — FLUOCINONIDE 0.05 % EX CREA
1.0000 | TOPICAL_CREAM | Freq: Two times a day (BID) | CUTANEOUS | 1 refills | Status: AC
Start: 2023-03-14 — End: ?

## 2023-03-14 NOTE — Progress Notes (Signed)
SUBJECTIVE:   Chief Complaint  Patient presents with   Referral   HPI Presents to clinic for referral to GI for chronic diarrhea  Discussed the use of AI scribe software for clinical note transcription with the patient, who gave verbal consent to proceed.  History of Present Illness The patient, with a history of hemorrhoids and gastrointestinal issues, presents with ongoing diarrhea since April 2023. The diarrhea is described as profuse, watery, and occurring every other day. The patient reports stool particles initially, followed by liquid stool. The patient also reports occasional red material in the stool, but is unsure if it is blood. The patient has experienced weight loss, which she attributes to food not being properly digested.   The patient has a history of liver problems, with elevated liver enzymes and B12 levels noted in previous blood work. She also reports a history of enlarged red blood cells and elevated MCV. The patient has ceased taking B12 supplements but continues with D3.  The patient has a history of hemorrhoids, which were classified as class four. She was referred to a specialist who performed surgery to remove three of six identified hemorrhoids. The patient reports this surgery as the worst she's ever had and resulted in urinary tract infections.  The patient also reports a rash that occurs on her forehead and under her breasts when working in the yard and sweating. She has been using prescribed creams for these rashes as needed.  The patient has made lifestyle changes, including cutting out most alcohol and drinking more fluids due to a dislike of plain water. She was previously taking a probiotic, but stopped due to cost. She has been using a vaginal cream, which she reports as helpful.  The patient has been seeking a referral for her gastrointestinal issues, but has experienced difficulty in securing an appointment. She expresses a preference for a previous  doctor who was helpful in referring her for her hemorrhoid issues.    PERTINENT PMH / PSH: As above  OBJECTIVE:  BP 124/70   Pulse 63   Temp 98.4 F (36.9 C)   Resp 16   Ht 5\' 5"  (1.651 m)   Wt 112 lb 2 oz (50.9 kg)   SpO2 99%   BMI 18.66 kg/m    Physical Exam Vitals reviewed.  Constitutional:      General: She is not in acute distress.    Appearance: Normal appearance. She is normal weight. She is not ill-appearing, toxic-appearing or diaphoretic.  Eyes:     General:        Right eye: No discharge.        Left eye: No discharge.     Conjunctiva/sclera: Conjunctivae normal.  Cardiovascular:     Rate and Rhythm: Normal rate.  Pulmonary:     Effort: Pulmonary effort is normal.  Skin:    General: Skin is warm.  Neurological:     General: No focal deficit present.     Mental Status: She is alert and oriented to person, place, and time. Mental status is at baseline.  Psychiatric:        Mood and Affect: Mood normal.        Behavior: Behavior normal.        Thought Content: Thought content normal.        Judgment: Judgment normal.        03/14/2023    1:11 PM 02/05/2023    2:02 PM 09/05/2021   10:12 AM 08/23/2021  9:58 AM 12/07/2020   11:29 AM  Depression screen PHQ 2/9  Decreased Interest 0 0 0 0 0  Down, Depressed, Hopeless 0 0 0 0 0  PHQ - 2 Score 0 0 0 0 0  Altered sleeping 0 0     Tired, decreased energy 0 0     Change in appetite 0 0     Feeling bad or failure about yourself  0 0     Trouble concentrating 0 0     Moving slowly or fidgety/restless 0 0     Suicidal thoughts 0 0     PHQ-9 Score 0 0     Difficult doing work/chores Not difficult at all Not difficult at all         03/14/2023    1:11 PM 02/05/2023    2:02 PM  GAD 7 : Generalized Anxiety Score  Nervous, Anxious, on Edge 1 0  Control/stop worrying 0 0  Worry too much - different things 0 0  Trouble relaxing 0 0  Restless 0 0  Easily annoyed or irritable 1 0  Afraid - awful might  happen 0 0  Total GAD 7 Score 2 0  Anxiety Difficulty Not difficult at all Not difficult at all    ASSESSMENT/PLAN:  Chronic diarrhea Assessment & Plan: Ongoing since April 2023 with episodes of fecal incontinence. No associated fever, nausea, vomiting, or abdominal pain. Previous workup by GI specialist in 2020 for hemorrhoids. Patient reports dissatisfaction with previous GI specialist. -Refer to Dr. Allegra Lai for further evaluation and management. -Labs today  Orders: -     Ambulatory referral to Gastroenterology -     TSH -     Iron, TIBC and Ferritin Panel -     Celiac Disease Panel -     Sedimentation rate -     C-reactive protein -     Calprotectin, Fecal -     Comprehensive metabolic panel -     Gamma GT -     CBC with Differential/Platelet -     Folate  Eczema, unspecified type -     Mometasone Furoate; Apply topically daily.  Dispense: 60 mL; Refill: 1  Intertrigo Assessment & Plan: Use of various topical medications for rashes and skin irritation. -Renew prescriptions for Elocon and Hydrocortisone cream as needed.  Orders: -     Fluocinonide; Apply 1 Application topically 2 (two) times daily.  Dispense: 30 g; Refill: 1  Need for influenza vaccination -     Flu Vaccine Trivalent High Dose (Fluad)  Vaginal atrophy Assessment & Plan: Improvement with vaginal cream. -Continue current treatment.   Diarrhea, unspecified type Assessment & Plan: Ongoing since April 2023 with episodes of fecal incontinence. No associated fever, nausea, vomiting, or abdominal pain. Previous workup by GI specialist in 2020 for hemorrhoids. Patient reports dissatisfaction with previous GI specialist. -Refer to Dr. Allegra Lai for further evaluation and management. -Labs today   Alcohol abuse Assessment & Plan: History of alcohol use, recently reduced. Previous labs showed elevated AST and ALT. -Repeat liver function tests. -Advise continued reduction in alcohol consumption.     PDMP  reviewed  Return if symptoms worsen or fail to improve, for PCP.  Dana Allan, MD

## 2023-03-14 NOTE — Patient Instructions (Signed)
It was a pleasure meeting you today. Thank you for allowing me to take part in your health care.  Our goals for today as we discussed include: We will get some labs today.  If they are abnormal or we need to do something about them, I will call you.  If they are normal, I will send you a message on MyChart (if it is active) or a letter in the mail.  If you don't hear from [place] in 2 weeks, please call the office at the number below.   Referral sent to GI  Flu vaccine today  Follow up as needed  This is a list of the screening recommended for you and due dates:  Health Maintenance  Topic Date Due   Zoster (Shingles) Vaccine (2 of 2) 11/02/2021   Medicare Annual Wellness Visit  08/24/2022   Flu Shot  08/12/2023*   Yearly kidney health urinalysis for diabetes  02/05/2024   Yearly kidney function blood test for diabetes  02/06/2024   Mammogram  02/25/2025   Colon Cancer Screening  03/08/2027   DTaP/Tdap/Td vaccine (3 - Td or Tdap) 06/25/2027   Pneumonia Vaccine  Completed   Hepatitis C Screening  Completed   HPV Vaccine  Aged Out   Complete foot exam   Discontinued   Hemoglobin A1C  Discontinued   Eye exam for diabetics  Discontinued   DEXA scan (bone density measurement)  Discontinued   COVID-19 Vaccine  Discontinued  *Topic was postponed. The date shown is not the original due date.    If you have any questions or concerns, please do not hesitate to call the office at [phone].  I look forward to our next visit and until then take care and stay safe.  Regards,   Dana Allan, MD   [facility]

## 2023-03-15 LAB — COMPREHENSIVE METABOLIC PANEL
ALT: 17 U/L (ref 0–35)
AST: 21 U/L (ref 0–37)
Albumin: 4 g/dL (ref 3.5–5.2)
Alkaline Phosphatase: 47 U/L (ref 39–117)
BUN: 12 mg/dL (ref 6–23)
CO2: 29 meq/L (ref 19–32)
Calcium: 10.1 mg/dL (ref 8.4–10.5)
Chloride: 104 meq/L (ref 96–112)
Creatinine, Ser: 0.68 mg/dL (ref 0.40–1.20)
GFR: 87.78 mL/min (ref >60.00)
Glucose, Bld: 101 mg/dL — ABNORMAL HIGH (ref 70–99)
Potassium: 3.8 meq/L (ref 3.5–5.1)
Sodium: 141 meq/L (ref 135–145)
Total Bilirubin: 0.6 mg/dL (ref 0.2–1.2)
Total Protein: 6.4 g/dL (ref 6.0–8.3)

## 2023-03-15 LAB — TSH: TSH: 1.42 u[IU]/mL (ref 0.35–5.50)

## 2023-03-15 LAB — CELIAC DISEASE PANEL
(tTG) Ab, IgA: <1 U/mL
(tTG) Ab, IgG: <1 U/mL
Gliadin IgA: <1 U/mL
Gliadin IgG: <1 U/mL
Immunoglobulin A: 192 mg/dL (ref 70–320)

## 2023-03-15 LAB — CBC WITH DIFFERENTIAL/PLATELET
Basophils Absolute: 0 10*3/uL (ref 0.0–0.1)
Basophils Relative: 0.7 % (ref 0.0–3.0)
Eosinophils Absolute: 0.2 10*3/uL (ref 0.0–0.7)
Eosinophils Relative: 3.2 % (ref 0.0–5.0)
HCT: 41.1 % (ref 36.0–46.0)
Hemoglobin: 13.6 g/dL (ref 12.0–15.0)
Lymphocytes Relative: 22.7 % (ref 12.0–46.0)
Lymphs Abs: 1.3 10*3/uL (ref 0.7–4.0)
MCHC: 33 g/dL (ref 30.0–36.0)
MCV: 104.8 fL — ABNORMAL HIGH (ref 78.0–100.0)
Monocytes Absolute: 0.6 10*3/uL (ref 0.1–1.0)
Monocytes Relative: 9.5 % (ref 3.0–12.0)
Neutro Abs: 3.7 10*3/uL (ref 1.4–7.7)
Neutrophils Relative %: 63.9 % (ref 43.0–77.0)
Platelets: 227 10*3/uL (ref 150.0–400.0)
RBC: 3.92 Mil/uL (ref 3.87–5.11)
RDW: 13.8 % (ref 11.5–15.5)
WBC: 5.9 10*3/uL (ref 4.0–10.5)

## 2023-03-15 LAB — IRON,TIBC AND FERRITIN PANEL
%SAT: 30 % (ref 16–45)
Ferritin: 103 ng/mL (ref 16–288)
Iron: 99 ug/dL (ref 45–160)
TIBC: 326 ug/dL (ref 250–450)

## 2023-03-15 LAB — C-REACTIVE PROTEIN: CRP: <1 mg/dL (ref 0.5–20.0)

## 2023-03-15 LAB — SEDIMENTATION RATE: Sed Rate: 9 mm/h (ref 0–30)

## 2023-03-15 LAB — FOLATE: Folate: 6 ng/mL (ref >5.9)

## 2023-03-15 LAB — GAMMA GT: GGT: 12 U/L (ref 7–51)

## 2023-03-17 ENCOUNTER — Encounter: Payer: Self-pay | Admitting: Family Medicine

## 2023-03-17 DIAGNOSIS — Z23 Encounter for immunization: Secondary | ICD-10-CM | POA: Insufficient documentation

## 2023-03-17 NOTE — Assessment & Plan Note (Signed)
Improvement with vaginal cream. -Continue current treatment.

## 2023-03-17 NOTE — Assessment & Plan Note (Signed)
History of alcohol use, recently reduced. Previous labs showed elevated AST and ALT. -Repeat liver function tests. -Advise continued reduction in alcohol consumption.

## 2023-03-17 NOTE — Assessment & Plan Note (Signed)
Use of various topical medications for rashes and skin irritation. -Renew prescriptions for Elocon and Hydrocortisone cream as needed.

## 2023-03-17 NOTE — Assessment & Plan Note (Signed)
Ongoing since April 2023 with episodes of fecal incontinence. No associated fever, nausea, vomiting, or abdominal pain. Previous workup by GI specialist in 2020 for hemorrhoids. Patient reports dissatisfaction with previous GI specialist. -Refer to Dr. Allegra Lai for further evaluation and management. -Labs today

## 2023-04-22 ENCOUNTER — Other Ambulatory Visit: Payer: Self-pay | Admitting: Family Medicine

## 2023-04-22 DIAGNOSIS — I10 Essential (primary) hypertension: Secondary | ICD-10-CM

## 2023-04-25 ENCOUNTER — Encounter: Payer: Self-pay | Admitting: Gastroenterology

## 2023-04-29 ENCOUNTER — Telehealth: Payer: Self-pay | Admitting: Family Medicine

## 2023-04-29 ENCOUNTER — Other Ambulatory Visit: Payer: Self-pay

## 2023-04-29 DIAGNOSIS — L309 Dermatitis, unspecified: Secondary | ICD-10-CM

## 2023-04-29 MED ORDER — MOMETASONE FUROATE 0.1 % EX SOLN: Freq: Every day | CUTANEOUS | 1 refills | Status: AC

## 2023-04-29 NOTE — Telephone Encounter (Signed)
 Rx request sent to provider

## 2023-04-29 NOTE — Telephone Encounter (Signed)
Prescription Request  04/29/2023  LOV: 03/14/2023  What is the name of the medication or equipment? mometasone   Have you contacted your pharmacy to request a refill? No   Which pharmacy would you like this sent to?  [id number] [place], [place] - [address] [place] [address] Phone: [phone] Fax: [phone]    Patient notified that their request is being sent to the clinical staff for review and that they should receive a response within 2 business days.   Please advise at Mobile [phone] (mobile)

## 2023-05-09 ENCOUNTER — Other Ambulatory Visit: Payer: Self-pay | Admitting: Family Medicine

## 2023-05-10 MED ORDER — MOMETASONE FUROATE 0.1 % EX CREA: 1.0000 | TOPICAL_CREAM | Freq: Every day | CUTANEOUS | 0 refills | Status: AC

## 2023-05-10 NOTE — Addendum Note (Signed)
Addended by: Glori Luis on: 05/10/2023 04:19 PM   Modules accepted: Orders

## 2023-05-10 NOTE — Telephone Encounter (Signed)
Sent to pharmacy 

## 2023-05-10 NOTE — Telephone Encounter (Signed)
Patient went to pick up her  mometasone (ELOCON) 0.1 % lotion . They gave her liquid form, which she uses, but she is out of the Mometasone Furoate CREAM, USP. [facility]

## 2023-05-24 ENCOUNTER — Telehealth: Payer: Self-pay | Admitting: Family Medicine

## 2023-05-24 NOTE — Telephone Encounter (Signed)
 Copied from CRM [id number]. Topic: Medicare AWV >> May 24, 2023  1:45 PM Nathanel DEL wrote: Reason for CRM: Called LVM 05/24/2023 to schedule AWV. Please schedule office or virtual visits  Nathanel Paschal; Care Guide Ambulatory Clinical Support [place] [facility] Direct Dial: [phone]

## 2023-06-17 ENCOUNTER — Other Ambulatory Visit: Payer: Self-pay

## 2023-06-19 ENCOUNTER — Encounter: Payer: Self-pay | Admitting: Gastroenterology

## 2023-06-19 ENCOUNTER — Ambulatory Visit: Payer: PPO | Admitting: Gastroenterology

## 2023-06-19 VITALS — BP 170/92 | HR 73 | Temp 98.2°F | Ht 65.0 in | Wt 114.0 lb

## 2023-06-19 DIAGNOSIS — K529 Noninfective gastroenteritis and colitis, unspecified: Secondary | ICD-10-CM | POA: Diagnosis not present

## 2023-06-19 DIAGNOSIS — R634 Abnormal weight loss: Secondary | ICD-10-CM | POA: Diagnosis not present

## 2023-06-19 NOTE — Progress Notes (Signed)
 Desiree JONELLE Brooklyn, MD [address]  [address]  [place], [place] [address]  Main: [phone]  Fax: [phone]    Gastroenterology Consultation  Referring Provider:     Hope Merle, MD Primary Care Physician:  Hope Merle, MD Primary Gastroenterologist:  Dr. Corinn JONELLE Pittman Reason for Consultation: Chronic diarrhea        HPI:   Desiree Pittman is a 73 y.o. female referred by Dr. Hope Merle, MD  for consultation & management of chronic diarrhea.  Patient states that she has been experiencing 3-4 episodes of brown bowel movements since April 2023.  She was referred to GI at that time, however she did not make appointment until now.  She describes that her first bowel movement is generally formed, subsequently have about 2-3 soft to watery nonbloody bowel movements by mid afternoon and no bowel movements rest of the day.  Her weight has been gradually downtrending.  Denies any loss of appetite.  This is impairing her quality of life. Patient states that she had her colonoscopy on 03/08/2017 with Dr. Lonni Pizza.  She states that she felt miserable with large volume prep and her hemorrhoids flared up.  She saw me as a virtual visit on 09/17/2018 during pandemic and I classified her hemorrhoids are grade 4 and advised her that banding would not help.  She then saw Dr. Evalene Shilling with [facility] on 10/03/2018, who did hemorrhoidectomy.  She had significant relief from hemorrhoids. For her workup of diarrhea, celiac disease panel was negative, ESR, CRP were normal, normal TSH, normal iron  panel, B12 and folate levels  NSAIDs: None  Antiplts/Anticoagulants/Anti thrombotics: None  GI Procedures:  Colonoscopy 03/07/2017 for hematochezia - Non- thrombosed external hemorrhoids and non- thrombosed internal hemorrhoids found on perianal exam. - One 2 mm polyp in the distal sigmoid colon, removed with a jumbo cold forceps. Resected and retrieved. - Diverticulosis in the sigmoid colon and  at the hepatic flexure. - The examination was otherwise normal on direct and retroflexion views.  Past Medical History:  Diagnosis Date  . Abnormal Pap smear of cervix   . Allergy   . Anxiety   . Arthritis   . Blood in stool    colonoscopy 02/2017  . Chicken pox   . Colon polyps   . Depression   . Diabetes mellitus without complication (HCC)    type 2  . Diverticulitis   . GERD (gastroesophageal reflux disease)    h/o ulcers ? GI   . Glaucoma    borderline   . Heart murmur   . Hyperlipidemia   . Hypertension   . PONV (postoperative nausea and vomiting)    pull tubes out,etc.  . Right knee pain    inj [facility] 11/2020  . Shoulder arthritis    right shoulder Dr. Arlo[facility]    Past Surgical History:  Procedure Laterality Date  . ANAL RECTAL MANOMETRY N/A 03/08/2017   Procedure: ANO RECTAL MANOMETRY;  Surgeon: Pizza Lonni HERO, MD;  Location: [facility];  Service: General;  Laterality: N/A;  . CARPAL TUNNEL RELEASE    . COLONOSCOPY WITH PROPOFOL  N/A 03/07/2017   Procedure: COLONOSCOPY WITH PROPOFOL ;  Surgeon: Pizza Lonni HERO, MD;  Location: [facility];  Service: General;  Laterality: N/A;  . finergy surgery     cut finger sewn back on  . HAND SURGERY     ring finger left hand   . HEMORRHOID SURGERY     10/03/18 exc of  mixed int/ext hemorrhoids x 3 columns Dr. Fleurette  . JOINT REPLACEMENT     left  hip 2005, 2010, right hip 2010   . TOTAL HIP ARTHROPLASTY  04/23/2012   Procedure: TOTAL HIP ARTHROPLASTY ANTERIOR APPROACH;  Surgeon: Dempsey LULLA Moan, MD;  Location: [facility];  Service: Orthopedics;  Laterality: Right;  . TOTAL HIP REVISION  04/23/2012   Procedure: TOTAL HIP REVISION;  Surgeon: Dempsey LULLA Moan, MD;  Location: [facility];  Service: Orthopedics;  Laterality: Left;  . TOTAL KNEE ARTHROPLASTY Left 02/17/2018   Procedure: LEFT TOTAL KNEE ARTHROPLASTY;  Surgeon: Moan Dempsey, MD;  Location: [facility];  Service: Orthopedics;  Laterality: Left;      Current Outpatient Medications:  .  estradiol  (ESTRACE  VAGINAL) 0.1 MG/GM vaginal cream, Place 1 Applicatorful vaginally at bedtime. Apply pea sized amount to urethral meatus at bedtime for 2 weeks then 2 times weekly., Disp: 42.5 g, Rfl: 12 .  fluocinonide  cream (LIDEX ) 0.05 %, Apply 1 Application topically 2 (two) times daily., Disp: 30 g, Rfl: 1 .  hydrochlorothiazide  (HYDRODIURIL ) 12.5 MG tablet, TAKE 1 TABLET BY MOUTH EVERY DAY, Disp: 90 tablet, Rfl: 0 .  losartan  (COZAAR ) 50 MG tablet, TAKE 1 TABLET BY MOUTH EVERY DAY, Disp: 90 tablet, Rfl: 3 .  mometasone  (ELOCON ) 0.1 % cream, Apply 1 Application topically daily., Disp: 45 g, Rfl: 0 .  mometasone  (ELOCON ) 0.1 % lotion, Apply topically daily., Disp: 60 mL, Rfl: 1 .  NON FORMULARY, Tru you 2 caps daily if sxs 4 caps daily max 6 caps, Disp: , Rfl:  .  NIFEdipine  (PROCARDIA -XL/NIFEDICAL-XL) 30 MG 24 hr tablet, Take 1 tablet by mouth daily. (Patient not taking: Reported on 06/19/2023), Disp: , Rfl:    Family History  Problem Relation Age of Onset  . Hypertension Mother   . Arthritis Mother   . Heart disease Mother   . Cancer Father        lung died age 63   . Alcohol abuse Son   . Cancer Other        colon caner      Social History   Tobacco Use  . Smoking status: Never  . Smokeless tobacco: Never  Vaping Use  . Vaping status: Never Used  Substance Use Topics  . Alcohol use: Yes    Alcohol/week: 6.0 - 10.0 standard drinks of alcohol    Types: 6 - 10 Standard drinks or equivalent per week    Comment: 1 bottle qhs most nights x 1.5 years prior to 03/2017  . Drug use: No    Allergies as of 06/19/2023 - Review Complete 06/19/2023  Allergen Reaction Noted  . Latex  06/24/2017  . Metformin and related  04/12/2017  . Propofol  Nausea And Vomiting 02/28/2017    Review of Systems:    All systems reviewed and negative except where noted in HPI.   Physical Exam:  BP (!) 170/92 (BP Location: Left Arm, Patient Position:  Sitting, Cuff Size: Normal)   Pulse 73   Temp 98.2 F (36.8 C) (Oral)   Ht 5' 5 (1.651 m)   Wt 114 lb (51.7 kg)   BMI 18.97 kg/m  No LMP recorded. Patient is postmenopausal.  General:   Alert,  Well-developed, well-nourished, pleasant and cooperative in NAD Head:  Normocephalic and atraumatic. Eyes:  Sclera clear, no icterus.   Conjunctiva pink. Ears:  Normal auditory acuity. Nose:  No deformity, discharge, or lesions. Mouth:  No deformity or lesions,oropharynx pink & moist. Neck:  Supple; no masses or thyromegaly. Lungs:  Respirations even and unlabored.  Clear throughout to auscultation.   No wheezes, crackles, or rhonchi. No acute distress. Heart:  Regular rate and rhythm; no murmurs, clicks, rubs, or gallops. Abdomen:  Normal bowel sounds. Soft, non-tender and non-distended without masses, hepatosplenomegaly or hernias noted.  No guarding or rebound tenderness.   Rectal: Not performed Msk:  Symmetrical without gross deformities. Good, equal movement & strength bilaterally. Pulses:  Normal pulses noted. Extremities:  No clubbing or edema.  No cyanosis. Neurologic:  Alert and oriented x3;  grossly normal neurologically. Skin:  Intact without significant lesions or rashes. No jaundice. Psych:  Alert and cooperative. Normal mood and affect.  Imaging Studies: Reviewed  Assessment and Plan:   Faustine Tates is a 73 y.o. female with history of hypertension is seen in consultation for more than 1 year history of increased bowel frequency during the day and gradual weight loss No evidence of anemia, TSH levels normal, ESR and CRP levels normal Celiac disease panel negative Patient denies any new medications or change in medications, denies any over-the-counter supplements Discussed about GI profile PCR, C. difficile toxin A and B, pancreatic fecal elastase levels and fecal calprotectin levels Endoscopic evaluation based on the above workup   Follow up in 3 months   Desiree JONELLE Brooklyn, MD

## 2023-06-20 ENCOUNTER — Encounter: Payer: Self-pay | Admitting: Gastroenterology

## 2023-06-20 ENCOUNTER — Telehealth: Payer: Self-pay | Admitting: Family Medicine

## 2023-06-20 ENCOUNTER — Ambulatory Visit: Payer: Self-pay | Admitting: Family Medicine

## 2023-06-20 DIAGNOSIS — L821 Other seborrheic keratosis: Secondary | ICD-10-CM | POA: Insufficient documentation

## 2023-06-20 NOTE — Telephone Encounter (Signed)
 This RN second attempt to contact patient for triage. No answer, voicemail left requesting return call to clinic.

## 2023-06-20 NOTE — Telephone Encounter (Signed)
 This RN made third and final attempt made to triage patient. Routing to office.

## 2023-06-20 NOTE — Telephone Encounter (Signed)
 This RN made first attempt to triage patient. No answer, left a message. Will continue to attempt.  Copied from CRM [id number]. Topic: Clinical - Medication Question >> Jun 20, 2023  2:00 PM Alexandria E wrote: Reason for CRM: Patient was calling because pharmacy did not have her losartan  (COZAAR ) 50 MG tablet. Patient told agent that she was experiencing high blood pressure (184/90) with headaches. Told patient that it would be best if I do a warm transfer to nurse triage to further discuss these symptoms, patient refused transfer and wanted to try eating something first. >> Jun 20, 2023  2:40 PM Tiffany H wrote: Patient called to advise that pharmacy provided her with two pills. She feels much better. She is waiting for her refill now.

## 2023-06-20 NOTE — Telephone Encounter (Signed)
 Copied from CRM [id number]. Topic: Clinical - Medication Question >> Jun 20, 2023  2:00 PM Alexandria E wrote: Reason for CRM: Patient was calling because pharmacy did not have her losartan  (COZAAR ) 50 MG tablet. Patient told agent that she was experiencing high blood pressure (184/90) with headaches. Told patient that it would be best if I do a warm transfer to nurse triage to further discuss these symptoms, patient refused transfer and wanted to try eating something first. >> Jun 20, 2023  2:40 PM Tiffany H wrote: Patient called to advise that pharmacy provided her with two pills. She feels much better. She is waiting for her refill now.

## 2023-06-21 ENCOUNTER — Other Ambulatory Visit: Payer: Self-pay

## 2023-06-21 DIAGNOSIS — I10 Essential (primary) hypertension: Secondary | ICD-10-CM

## 2023-06-21 DIAGNOSIS — K529 Noninfective gastroenteritis and colitis, unspecified: Secondary | ICD-10-CM | POA: Diagnosis not present

## 2023-06-21 MED ORDER — LOSARTAN POTASSIUM 50 MG PO TABS: 50.0000 mg | ORAL_TABLET | Freq: Every day | ORAL | 3 refills | Status: AC

## 2023-06-21 NOTE — Telephone Encounter (Signed)
 Losartan  sent to pharmacy pt advised.

## 2023-06-24 ENCOUNTER — Encounter: Payer: Self-pay | Admitting: Gastroenterology

## 2023-06-25 ENCOUNTER — Telehealth: Payer: Self-pay

## 2023-06-25 LAB — GI PROFILE, STOOL, PCR

## 2023-06-25 LAB — PANCREATIC ELASTASE, FECAL

## 2023-06-25 LAB — CALPROTECTIN, FECAL: Calprotectin, Fecal: 13 ug/g (ref 0–120)

## 2023-06-25 LAB — C DIFFICILE TOXINS A+B W/RFLX: C difficile Toxins A+B, EIA: NEGATIVE

## 2023-06-25 LAB — C DIFFICILE, CYTOTOXIN B

## 2023-06-25 NOTE — Telephone Encounter (Signed)
-----   Message from [facility] sent at 06/25/2023  8:05 AM EST ----- Rest of the stool studies came back normal. Recommend colonoscopy to rule microscopic colitis if pt is agreeable  RV

## 2023-06-25 NOTE — Telephone Encounter (Signed)
Called and left a message for call back

## 2023-06-26 ENCOUNTER — Other Ambulatory Visit: Payer: Self-pay

## 2023-06-26 DIAGNOSIS — K529 Noninfective gastroenteritis and colitis, unspecified: Secondary | ICD-10-CM

## 2023-06-26 MED ORDER — NA SULFATE-K SULFATE-MG SULF 17.5-3.13-1.6 GM/177ML PO SOLN
354.0000 mL | Freq: Once | ORAL | 0 refills | Status: AC
Start: 2023-06-26 — End: 2023-06-26

## 2023-06-26 NOTE — Telephone Encounter (Signed)
Called patient and patient verbalized understanding of results. Schedule patient for colonoscopy on 07/10/2023. Went over instructions, mailed them and sent to [employer]. Sent prep to the pharmacy

## 2023-07-10 ENCOUNTER — Ambulatory Visit: Payer: PPO | Admitting: Anesthesiology

## 2023-07-10 ENCOUNTER — Ambulatory Visit
Admission: RE | Admit: 2023-07-10 | Discharge: 2023-07-10 | Disposition: A | Discharge status: Home / Self Care | Payer: PPO | Attending: Gastroenterology | Admitting: Gastroenterology

## 2023-07-10 ENCOUNTER — Encounter
Admission: RE | Disposition: A | Discharge status: Home / Self Care | Payer: Self-pay | Source: Home / Self Care | Attending: Gastroenterology

## 2023-07-10 ENCOUNTER — Encounter: Payer: Self-pay | Admitting: Gastroenterology

## 2023-07-10 DIAGNOSIS — I10 Essential (primary) hypertension: Secondary | ICD-10-CM | POA: Diagnosis not present

## 2023-07-10 DIAGNOSIS — K219 Gastro-esophageal reflux disease without esophagitis: Secondary | ICD-10-CM | POA: Insufficient documentation

## 2023-07-10 DIAGNOSIS — E119 Type 2 diabetes mellitus without complications: Secondary | ICD-10-CM | POA: Diagnosis not present

## 2023-07-10 DIAGNOSIS — K5730 Diverticulosis of large intestine without perforation or abscess without bleeding: Secondary | ICD-10-CM | POA: Diagnosis not present

## 2023-07-10 DIAGNOSIS — K529 Noninfective gastroenteritis and colitis, unspecified: Secondary | ICD-10-CM | POA: Insufficient documentation

## 2023-07-10 HISTORY — PX: COLONOSCOPY WITH PROPOFOL: SHX5780

## 2023-07-10 HISTORY — PX: BIOPSY: SHX5522

## 2023-07-10 SURGERY — COLONOSCOPY WITH PROPOFOL
Anesthesia: General

## 2023-07-10 MED ORDER — PROPOFOL 500 MG/50ML IV EMUL
INTRAVENOUS | Status: DC | PRN
  Administered 2023-07-10: 150 ug/kg/min via INTRAVENOUS

## 2023-07-10 MED ORDER — LIDOCAINE HCL (CARDIAC) PF 100 MG/5ML IV SOSY
PREFILLED_SYRINGE | INTRAVENOUS | Status: DC | PRN
  Administered 2023-07-10: 40 mg via INTRAVENOUS
  Administered 2023-07-10: 4 mg via INTRAVENOUS

## 2023-07-10 MED ORDER — SODIUM CHLORIDE 0.9 % IV SOLN
INTRAVENOUS | Status: DC
  Administered 2023-07-10: 500 mL via INTRAVENOUS

## 2023-07-10 MED ORDER — EPHEDRINE SULFATE-NACL 50-0.9 MG/10ML-% IV SOSY
PREFILLED_SYRINGE | INTRAVENOUS | Status: DC | PRN
  Administered 2023-07-10: 10 mg via INTRAVENOUS

## 2023-07-10 MED ORDER — DEXMEDETOMIDINE HCL IN NACL 80 MCG/20ML IV SOLN
INTRAVENOUS | Status: DC | PRN
  Administered 2023-07-10: 8 ug via INTRAVENOUS

## 2023-07-10 MED ORDER — PROPOFOL 10 MG/ML IV BOLUS
INTRAVENOUS | Status: DC | PRN
  Administered 2023-07-10: 80 mg via INTRAVENOUS

## 2023-07-10 NOTE — Anesthesia Postprocedure Evaluation (Signed)
 Anesthesia Post Note  Patient: Desiree Pittman  Procedure(s) Performed: COLONOSCOPY WITH PROPOFOL BIOPSY  Patient location during evaluation: Endoscopy Anesthesia Type: General Level of consciousness: awake and alert Pain management: pain level controlled Vital Signs Assessment: post-procedure vital signs reviewed and stable Respiratory status: spontaneous breathing, nonlabored ventilation, respiratory function stable and patient connected to nasal cannula oxygen Cardiovascular status: blood pressure returned to baseline and stable Postop Assessment: no apparent nausea or vomiting Anesthetic complications: no   No notable events documented.   Last Vitals:  Vitals:   07/10/23 1146 07/10/23 1150  BP: 93/62 90/75  Pulse: 78 75  Resp: (!) 24 (!) 21  Temp:    SpO2: 100% 100%    Last Pain:  Vitals:   07/10/23 1146  TempSrc:   PainSc: 0-No pain                 Cleda Mccreedy Kai Calico

## 2023-07-10 NOTE — Anesthesia Procedure Notes (Signed)
 Date/Time: 07/10/2023 11:06 AM  Performed by: Stormy Fabian, CRNAPre-anesthesia Checklist: Patient identified, Emergency Drugs available, Suction available and Patient being monitored Patient Re-evaluated:Patient Re-evaluated prior to induction Oxygen Delivery Method: Nasal cannula Induction Type: IV induction Dental Injury: Teeth and Oropharynx as per pre-operative assessment  Comments: Nasal cannula with etCO2 monitoring

## 2023-07-10 NOTE — H&P (Signed)
 Arlyss Repress, MD [address]  [address]  [place], [place] [address]  Main: [phone]  Fax: [phone] Pager: [phone]  Primary Care Physician:  Dana Allan, MD Primary Gastroenterologist:  Dr. Arlyss Repress  Pre-Procedure History & Physical: HPI:  Desiree Pittman is a 73 y.o. female is here for an colonoscopy.   Past Medical History:  Diagnosis Date   Abnormal Pap smear of cervix    Allergy    Anxiety    Arthritis    Blood in stool    colonoscopy 02/2017   Chicken pox    Colon polyps    Depression    Diabetes mellitus without complication (HCC)    type 2   Diverticulitis    GERD (gastroesophageal reflux disease)    h/o ulcers ? GI    Glaucoma    borderline    Heart murmur    Hyperlipidemia    Hypertension    PONV (postoperative nausea and vomiting)    pull tubes out,etc.   Right knee pain    inj [facility] 11/2020   Shoulder arthritis    right shoulder Dr. Tiburcio Pea [facility]    Past Surgical History:  Procedure Laterality Date   ANAL RECTAL MANOMETRY N/A 03/08/2017   Procedure: ANO RECTAL MANOMETRY;  Surgeon: Andria Meuse, MD;  Location: [facility];  Service: General;  Laterality: N/A;   CARPAL TUNNEL RELEASE     COLONOSCOPY WITH PROPOFOL N/A 03/07/2017   Procedure: COLONOSCOPY WITH PROPOFOL;  Surgeon: Andria Meuse, MD;  [facility]: [facility];  Service: General;  Laterality: N/A;   finergy surgery     cut finger sewn back on   HAND SURGERY     ring finger left hand    HEMORRHOID SURGERY     10/03/18 exc of mixed int/ext hemorrhoids x 3 columns Dr. Elenore Rota   JOINT REPLACEMENT     left  hip 2005, 2010, right hip 2010    TOTAL HIP ARTHROPLASTY  04/23/2012   Procedure: TOTAL HIP ARTHROPLASTY ANTERIOR APPROACH;  Surgeon: Loanne Drilling, MD;  Location: [facility];  Service: Orthopedics;  Laterality: Right;   TOTAL HIP REVISION  04/23/2012   Procedure: TOTAL HIP REVISION;  Surgeon: Loanne Drilling, MD;  Location: [facility];   Service: Orthopedics;  Laterality: Left;   TOTAL KNEE ARTHROPLASTY Left 02/17/2018   Procedure: LEFT TOTAL KNEE ARTHROPLASTY;  Surgeon: Ollen Gross, MD;  Location: [facility];  Service: Orthopedics;  Laterality: Left;    Prior to Admission medications   Medication Sig Start Date End Date Taking? Authorizing Provider  estradiol (ESTRACE VAGINAL) 0.1 MG/GM vaginal cream Place 1 Applicatorful vaginally at bedtime. Apply pea sized amount to urethral meatus at bedtime for 2 weeks then 2 times weekly. 02/05/23   Dana Allan, MD  fluocinonide cream (LIDEX) 0.05 % Apply 1 Application topically 2 (two) times daily. 03/14/23   Dana Allan, MD  hydrochlorothiazide (HYDRODIURIL) 12.5 MG tablet TAKE 1 TABLET BY MOUTH EVERY DAY 04/23/23   Dana Allan, MD  losartan (COZAAR) 50 MG tablet Take 1 tablet (50 mg total) by mouth daily. 06/21/23   Dana Allan, MD  mometasone (ELOCON) 0.1 % cream Apply 1 Application topically daily. 05/10/23   Glori Luis, MD  mometasone (ELOCON) 0.1 % lotion Apply topically daily. 04/29/23   Dana Allan, MD  NIFEdipine (PROCARDIA-XL/NIFEDICAL-XL) 30 MG 24 hr tablet Take 1 tablet by mouth daily. Patient not taking: Reported on 06/19/2023    [provider]  NON  FORMULARY Tru you 2 caps daily if sxs 4 caps daily max 6 caps    [provider]    Allergies as of 06/26/2023 - Review Complete 06/19/2023  Allergen Reaction Noted   Latex  06/24/2017   Metformin and related  04/12/2017   Propofol Nausea And Vomiting 02/28/2017    Family History  Problem Relation Age of Onset   Hypertension Mother    Arthritis Mother    Heart disease Mother    Cancer Father        lung died age 22    Alcohol abuse Son    Cancer Other        colon caner     Social History   Socioeconomic History   Marital status: Married    Spouse name: Not on file   Number of children: Not on file   Years of education: Not on file   Highest education level: Not on file   Occupational History   Not on file  Tobacco Use   Smoking status: Never   Smokeless tobacco: Never  Vaping Use   Vaping status: Never Used  Substance and Sexual Activity   Alcohol use: Yes    Alcohol/week: 6.0 - 10.0 standard drinks of alcohol    Types: 6 - 10 Standard drinks or equivalent per week    Comment: 1 bottle qhs most nights x 1.5 years prior to 03/2017   Drug use: No   Sexual activity: Yes  Other Topics Concern   Not on file  Social History Narrative   Lives with her husband.   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    16 chickens, 1 royster, 2 dogs like growing crops   Social Drivers of Corporate investment banker Strain: Low Risk  (08/23/2021)   Overall Financial Resource Strain (CARDIA)    Difficulty of Paying Living Expenses: Not hard at all  Food Insecurity: No Food Insecurity (08/23/2021)   Hunger Vital Sign    Worried About Running Out of Food in the Last Year: Never true    Ran Out of Food in the Last Year: Never true  Transportation Needs: No Transportation Needs (08/23/2021)   PRAPARE - Administrator, Civil Service (Medical): No    Lack of Transportation (Non-Medical): No  Physical Activity: Not on file  Stress: No Stress Concern Present (08/23/2021)   [employer] of Occupational Health - Occupational Stress Questionnaire    Feeling of Stress : Not at all  Social Connections: Unknown (08/23/2021)   Social Connection and Isolation Panel [NHANES]    Frequency of Communication with Friends and Family: Not on file    Frequency of Social Gatherings with Friends and Family: Not on file    Attends Religious Services: Not on file    Active Member of Clubs or Organizations: Not on file    Attends Banker Meetings: Not on file    Marital Status: Married   Intimate Partner Violence: Not At Risk (08/23/2021)   Humiliation, Afraid, Rape, and Kick questionnaire    Fear of Current or Ex-Partner: No    Emotionally Abused: No    Physically Abused: No    Sexually Abused: No    Review of Systems: See HPI, otherwise negative  ROS  Physical Exam: BP 124/76   Pulse 66   Temp (!) 96.4 F (35.8 C) (Temporal)   Resp 16   Ht 5\' 3"  (1.6 m)   Wt 49.3 kg   SpO2 100%   BMI 19.24 kg/m  General:   Alert,  pleasant and cooperative in NAD Head:  Normocephalic and atraumatic. Neck:  Supple; no masses or thyromegaly. Lungs:  Clear throughout to auscultation.    Heart:  Regular rate and rhythm. Abdomen:  Soft, nontender and nondistended. Normal bowel sounds, without guarding, and without rebound.   Neurologic:  Alert and  oriented x4;  grossly normal neurologically.  Impression/Plan: Desiree Pittman is here for an colonoscopy to be performed for chronic diarrhea  Risks, benefits, limitations, and alternatives regarding  colonoscopy have been reviewed with the patient.  Questions have been answered.  All parties agreeable.   Lannette Donath, MD  07/10/2023, 11:00 AM

## 2023-07-10 NOTE — Anesthesia Preprocedure Evaluation (Signed)
 Anesthesia Evaluation  Patient identified by MRN, date of birth, ID band Patient awake    Reviewed: Allergy & Precautions, NPO status , Patient's Chart, lab work & pertinent test results  History of Anesthesia Complications (+) PONV and history of anesthetic complications  Airway Mallampati: III  TM Distance: <3 FB Neck ROM: full    Dental  (+) Chipped   Pulmonary neg pulmonary ROS, neg shortness of breath   Pulmonary exam normal        Cardiovascular Exercise Tolerance: Good hypertension, (-) angina Normal cardiovascular exam     Neuro/Psych  PSYCHIATRIC DISORDERS      negative neurological ROS     GI/Hepatic Neg liver ROS,GERD  ,,  Endo/Other  diabetes, Type 2    Renal/GU negative Renal ROS  negative genitourinary   Musculoskeletal   Abdominal   Peds  Hematology negative hematology ROS (+)   Anesthesia Other Findings Past Medical History: No date: Abnormal Pap smear of cervix No date: Allergy No date: Anxiety No date: Arthritis No date: Blood in stool     Comment:  colonoscopy 02/2017 No date: Chicken pox No date: Colon polyps No date: Depression No date: Diabetes mellitus without complication (HCC)     Comment:  type 2 No date: Diverticulitis No date: GERD (gastroesophageal reflux disease)     Comment:  h/o ulcers ? GI  No date: Glaucoma     Comment:  borderline  No date: Heart murmur No date: Hyperlipidemia No date: Hypertension No date: PONV (postoperative nausea and vomiting)     Comment:  pull tubes out,etc. No date: Right knee pain     Comment:  inj [facility] 11/2020 No date: Shoulder arthritis     Comment:  right shoulder Dr. Tiburcio Pea [facility]  Past Surgical History: 03/08/2017: ANAL RECTAL MANOMETRY; N/A     Comment:  Procedure: ANO RECTAL MANOMETRY;  Surgeon: Andria Meuse, MD;  Location: [facility];  Service:               General;  Laterality: N/A; No  date: CARPAL TUNNEL RELEASE 03/07/2017: COLONOSCOPY WITH PROPOFOL; N/A     Comment:  Procedure: COLONOSCOPY WITH PROPOFOL;  Surgeon: Andria Meuse, MD;  Location: [facility];  Service:               General;  Laterality: N/A; No date: finergy surgery     Comment:  cut finger sewn back on No date: HAND SURGERY     Comment:  ring finger left hand  No date: HEMORRHOID SURGERY     Comment:  10/03/18 exc of mixed int/ext hemorrhoids x 3 columns Dr.              Elenore Rota No date: JOINT REPLACEMENT     Comment:  left  hip 2005, 2010, right hip 2010  04/23/2012: TOTAL HIP ARTHROPLASTY     Comment:  Procedure: TOTAL HIP ARTHROPLASTY ANTERIOR APPROACH;                Surgeon: Loanne Drilling, MD;  Location: [facility];                Service: Orthopedics;  Laterality: Right; 04/23/2012: TOTAL HIP REVISION     Comment:  Procedure: TOTAL HIP REVISION;  Surgeon: Gus Rankin  Aluisio, MD;  Location: [facility];  Service: Orthopedics;                Laterality: Left; 02/17/2018: TOTAL KNEE ARTHROPLASTY; Left     Comment:  Procedure: LEFT TOTAL KNEE ARTHROPLASTY;  Surgeon:               Ollen Gross, MD;  Location: [facility];  Service:               Orthopedics;  Laterality: Left;  BMI    Body Mass Index: 19.24 kg/m      Reproductive/Obstetrics negative OB ROS                             Anesthesia Physical Anesthesia Plan  ASA: 2  Anesthesia Plan: General   Post-op Pain Management:    Induction: Intravenous  PONV Risk Score and Plan: Propofol infusion and TIVA  Airway Management Planned: Natural Airway and Nasal Cannula  Additional Equipment:   Intra-op Plan:   Post-operative Plan:   Informed Consent: I have reviewed the patients History and Physical, chart, labs and discussed the procedure including the risks, benefits and alternatives for the proposed anesthesia with the patient or authorized representative who has indicated his/her  understanding and acceptance.     Dental Advisory Given  Plan Discussed with: Anesthesiologist, CRNA and Surgeon  Anesthesia Plan Comments: (Patient consented for risks of anesthesia including but not limited to:  - adverse reactions to medications - risk of airway placement if required - damage to eyes, teeth, lips or other oral mucosa - nerve damage due to positioning  - sore throat or hoarseness - Damage to heart, brain, nerves, lungs, other parts of body or loss of life  Patient voiced understanding and assent.)       Anesthesia Quick Evaluation

## 2023-07-10 NOTE — Op Note (Signed)
 [facility] Patient Name: Desiree Pittman Procedure Date: 07/10/2023 10:56 AM MRN: [id number] Account #: [id number] Date of Birth: [birth year] Admit Type: Outpatient Age: 73 Room: [facility] ENDO ROOM 4 Gender: Female Note Status: Finalized Instrument Name: Peds Colonoscope [id number] Procedure:             Colonoscopy Indications:           Last colonoscopy: October 2018, Chronic diarrhea,                         Clinically significant diarrhea of unexplained origin Providers:             Toney Reil MD, MD Referring MD:          Ena Dawley. Clent Ridges (Referring MD) Medicines:             General Anesthesia Complications:         No immediate complications. Estimated blood loss: None. Procedure:             Pre-Anesthesia Assessment:                        - Prior to the procedure, a History and Physical was                         performed, and patient medications and allergies were                         reviewed. The patient is competent. The risks and                         benefits of the procedure and the sedation options and                         risks were discussed with the patient. All questions                         were answered and informed consent was obtained.                         Patient identification and proposed procedure were                         verified by the physician, the nurse, the                         anesthesiologist, the anesthetist and the technician                         in the pre-procedure area in the procedure room in the                         endoscopy suite. Mental Status Examination: alert and                         oriented. Airway Examination: normal oropharyngeal                         airway and neck mobility. Respiratory Examination:  clear to auscultation. CV Examination: normal.                         Prophylactic Antibiotics: The patient does not require                          prophylactic antibiotics. Prior Anticoagulants: The                         patient has taken no anticoagulant or antiplatelet                         agents. ASA Grade Assessment: II - A patient with mild                         systemic disease. After reviewing the risks and                         benefits, the patient was deemed in satisfactory                         condition to undergo the procedure. The anesthesia                         plan was to use general anesthesia. Immediately prior                         to administration of medications, the patient was                         re-assessed for adequacy to receive sedatives. The                         heart rate, respiratory rate, oxygen saturations,                         blood pressure, adequacy of pulmonary ventilation, and                         response to care were monitored throughout the                         procedure. The physical status of the patient was                         re-assessed after the procedure.                        After obtaining informed consent, the colonoscope was                         passed under direct vision. Throughout the procedure,                         the patient's blood pressure, pulse, and oxygen                         saturations were monitored continuously. The  Colonoscope was introduced through the anus and                         advanced to the the cecum, identified by appendiceal                         orifice and ileocecal valve. The colonoscopy was                         performed without difficulty. The patient tolerated                         the procedure well. The quality of the bowel                         preparation was evaluated using the BBPS [facility] Bowel                         Preparation Scale) with scores of: Right Colon = 3,                         Transverse Colon = 3 and Left Colon = 3 (entire mucosa                          seen well with no residual staining, small fragments                         of stool or opaque liquid). The total BBPS score                         equals 9. The ileocecal valve, appendiceal orifice,                         and rectum were photographed. Findings:      The perianal and digital rectal examinations were normal. Pertinent       negatives include normal sphincter tone and no palpable rectal lesions.      Unable to intubate TI despite several attempts      The entire examined colon appeared normal. Biopsies for histology were       taken with a cold forceps from the entire colon for evaluation of       microscopic colitis. Estimated blood loss: none.      Multiple small-mouthed diverticula were found in the recto-sigmoid colon       and sigmoid colon.      The retroflexed view of the distal rectum and anal verge was normal and       showed no anal or rectal abnormalities. Impression:            - The entire examined colon is normal. Biopsied.                        - Diverticulosis in the recto-sigmoid colon and in the                         sigmoid colon.                        -  The distal rectum and anal verge are normal on                         retroflexion view. Recommendation:        - Discharge patient to home (with escort).                        - Resume previous diet today.                        - Continue present medications.                        - Await pathology results. Procedure Code(s):     --- Professional ---                        [id number], Colonoscopy, flexible; with biopsy, single or                         multiple Diagnosis Code(s):     --- Professional ---                        K52.9, Noninfective gastroenteritis and colitis,                         unspecified                        R19.7, Diarrhea, unspecified                        K57.30, Diverticulosis of large intestine without                         perforation or  abscess without bleeding CPT copyright 2022 American Medical Association. All rights reserved. The codes documented in this report are preliminary and upon coder review may  be revised to meet current compliance requirements. Dr. Libby Maw Toney Reil MD, MD 07/10/2023 11:27:43 AM This report has been signed electronically. Number of Addenda: 0 Note Initiated On: 07/10/2023 10:56 AM Scope Withdrawal Time: 0 hours 13 minutes 36 seconds  Total Procedure Duration: 0 hours 17 minutes 1 second  Estimated Blood Loss:  Estimated blood loss: none.      [facility]

## 2023-07-10 NOTE — Transfer of Care (Signed)
 Immediate Anesthesia Transfer of Care Note  Patient: Desiree Pittman  Procedure(s) Performed: Procedure(s): COLONOSCOPY WITH PROPOFOL (N/A) BIOPSY  Patient Location: PACU and Endoscopy Unit  Anesthesia Type:General  Level of Consciousness: sedated  Airway & Oxygen Therapy: Patient Spontanous Breathing and Patient connected to nasal cannula oxygen  Post-op Assessment: Report given to RN and Post -op Vital signs reviewed and stable  Post vital signs: Reviewed and stable  Last Vitals:  Vitals:   07/10/23 1033 07/10/23 1130  BP: 124/76 (!) 99/59  Pulse: 66 82  Resp: 16 10  Temp: (!) 35.8 C   SpO2: 100% 100%    Complications: No apparent anesthesia complications

## 2023-07-11 LAB — SURGICAL PATHOLOGY

## 2023-07-15 ENCOUNTER — Telehealth: Payer: Self-pay

## 2023-07-15 DIAGNOSIS — K529 Noninfective gastroenteritis and colitis, unspecified: Secondary | ICD-10-CM

## 2023-07-15 NOTE — Telephone Encounter (Signed)
 Called and left a message for call back

## 2023-07-15 NOTE — Telephone Encounter (Signed)
 The patient called in to speak to the about her result, but she was at the [facility]. She can't see if a voicemail was left because she doesn't know how to get them off. Please call the patient back. The best number to call is her home phone.

## 2023-07-15 NOTE — Telephone Encounter (Signed)
-----   Message from [facility] sent at 07/15/2023 11:49 AM EST ----- Pathology results from colonoscopy came back normal.  Her diarrhea is still unexplained, therefore recommend to check fasting serum gastrin levels, and serum immunoglobulin levels Also, check with her if she continues to losing weight  RV

## 2023-07-16 NOTE — Addendum Note (Signed)
 Addended by: Radene Knee L on: 07/16/2023 11:58 AM   Modules accepted: Orders

## 2023-07-16 NOTE — Telephone Encounter (Signed)
 Patient verbalized understanding of results. She will go to the lab one morning to have the labs done.

## 2023-07-31 NOTE — Telephone Encounter (Signed)
 Pt wanted to confirm

## 2023-08-01 DIAGNOSIS — M1711 Unilateral primary osteoarthritis, right knee: Secondary | ICD-10-CM | POA: Diagnosis not present

## 2023-08-02 DIAGNOSIS — K529 Noninfective gastroenteritis and colitis, unspecified: Secondary | ICD-10-CM | POA: Diagnosis not present

## 2023-08-09 LAB — IMMUNOGLOBULINS A/E/G/M, SERUM
IgA/Immunoglobulin A, Serum: 277 mg/dL (ref 64–422)
IgE (Immunoglobulin E), Serum: 110 [IU]/mL (ref 6–495)
IgG (Immunoglobin G), Serum: 868 mg/dL (ref 586–1602)
IgM (Immunoglobulin M), Srm: 43 mg/dL (ref 26–217)

## 2023-08-09 LAB — GASTRIN: Gastrin: 96 pg/mL (ref 0–115)

## 2023-08-12 ENCOUNTER — Encounter: Payer: Self-pay | Admitting: Gastroenterology

## 2023-08-31 ENCOUNTER — Other Ambulatory Visit: Payer: Self-pay | Admitting: Family Medicine

## 2023-08-31 DIAGNOSIS — I10 Essential (primary) hypertension: Secondary | ICD-10-CM

## 2023-09-02 ENCOUNTER — Telehealth: Payer: Self-pay | Admitting: Family Medicine

## 2023-09-02 ENCOUNTER — Telehealth: Payer: Self-pay

## 2023-09-02 NOTE — Telephone Encounter (Signed)
 Pt needs a TOC scheduled

## 2023-09-02 NOTE — Telephone Encounter (Signed)
 Dr Sueanne Emerald is leaving the practice and your Transfer of Care needs to be rescheduled with another provider. Please call the office to schedule a Transfer of Care to either Dr Casimir Cleaver, Tino Foreman or Charanpreet Kaur, NP.   Thank you  E2C2, please schedule TOC for this pt. [facility]

## 2023-10-24 ENCOUNTER — Ambulatory Visit: Payer: Self-pay

## 2023-10-24 NOTE — Telephone Encounter (Signed)
 FYI Only or Action Required?: Action required by provider  Patient was last seen in primary care on 03/14/2023 by Valli Gaw, MD. Called Nurse Triage reporting burning with urination and Urinary Frequency. Symptoms began several weeks ago. Interventions attempted: OTC medications: UQuora and Rest, hydration, or home remedies. Symptoms are: unchanged.  Triage Disposition: See Physician Within 24 Hours  Patient/caregiver understands and will follow disposition?: Yes Copied from CRM [id number]. Topic: Clinical - Red Word Triage >> Oct 24, 2023  2:33 PM Melissa C wrote: Red Word that prompted transfer to Nurse Triage: patient has a UTI, stated she has had it for about three weeks hoping it would go away trying over the counter methods on it and it has not gone away Reason for Disposition  Urinating more frequently than usual (i.e., frequency)  Answer Assessment - Initial Assessment Questions 1. SYMPTOM: What's the main symptom you're concerned about? (e.g., frequency, incontinence)     Frequency, burning sensation 2. ONSET: When did the  urinary frequency, burning sensation  start?     3 weeks ago 3. PAIN: Is there any pain? If Yes, ask: How bad is it? (Scale: 1-10; mild, moderate, severe)     5 out of 10 4. CAUSE: What do you think is causing the symptoms?     Probable UTI 5. OTHER SYMPTOMS: Do you have any other symptoms? (e.g., blood in urine, fever, flank pain, pain with urination)     Burning with urination,   Patient with hx of UTIs. Patient states she has been camping for the last three weekends and hasn't had a chance to get her symptoms checked out. Discussed with patient about making an appointment for a transfer of care. Patient states she wasn't told about this. Patient is asking for a phone call from the office because she would like to stay in the same office.  Protocols used: Urinary Symptoms-A-AH

## 2023-10-25 ENCOUNTER — Ambulatory Visit (INDEPENDENT_AMBULATORY_CARE_PROVIDER_SITE_OTHER): Admitting: Primary Care

## 2023-10-25 ENCOUNTER — Other Ambulatory Visit: Payer: Self-pay | Admitting: Family Medicine

## 2023-10-25 ENCOUNTER — Encounter: Payer: Self-pay | Admitting: Primary Care

## 2023-10-25 VITALS — BP 126/74 | HR 62 | Temp 97.2°F | Ht 63.0 in | Wt 108.0 lb

## 2023-10-25 DIAGNOSIS — R300 Dysuria: Secondary | ICD-10-CM | POA: Diagnosis not present

## 2023-10-25 DIAGNOSIS — L304 Erythema intertrigo: Secondary | ICD-10-CM

## 2023-10-25 LAB — POC URINALSYSI DIPSTICK (AUTOMATED)
Bilirubin, UA: NEGATIVE
Blood, UA: POSITIVE
Glucose, UA: NEGATIVE
Ketones, UA: NEGATIVE
Nitrite, UA: NEGATIVE
Protein, UA: POSITIVE — AB
Spec Grav, UA: 1.015 (ref 1.010–1.025)
Urobilinogen, UA: 0.2 U/dL
pH, UA: 6 (ref 5.0–8.0)

## 2023-10-25 MED ORDER — SULFAMETHOXAZOLE-TRIMETHOPRIM 800-160 MG PO TABS: 1.0000 | ORAL_TABLET | Freq: Two times a day (BID) | ORAL | 0 refills | Status: AC

## 2023-10-25 NOTE — Assessment & Plan Note (Addendum)
 UA today with 3+ leuks, 2+ blood. Chronic microscopic hematuria.  Urine culture ordered and pending. Reviewed urine culture from September 2024 which does show pan resistance.  Given history of recurrent cystitis, coupled with symptoms, will treat for presumed infection.  Start Bactrim DS (sulfamethoxazole/trimethoprim) tablets for urinary tract infection. Take 1 tablet by mouth twice daily for 3 days.  Follow-up as needed.

## 2023-10-25 NOTE — Patient Instructions (Signed)
 Start Bactrim  DS (sulfamethoxazole /trimethoprim ) tablets for urinary tract infection. Take 1 tablet by mouth twice daily for 3 days.  It was a pleasure meeting you!

## 2023-10-25 NOTE — Progress Notes (Signed)
 Subjective:    Patient ID: Desiree Pittman, female    DOB: 1950-09-29, 73 y.o.   MRN: [id number]  Dysuria  Associated symptoms include frequency. Pertinent negatives include no hematuria or urgency.    Desiree Pittman is a very pleasant 73 y.o. female patient of Dr. Sueanne Emerald with a history of hemorrhoids, hypertension, chronic diarrhea, recurrent cystitis, dysuria who presents today to discuss dysuria.  Symptom onset 3 weeks ago with dysuria. She then began noticing a foul odor, urinary frequency, cloudy urine, pelvic drawing sensation.   She denies hematuria, fevers, abdominal pain, vaginal itching or discharge. She's been taking an OTC supplement.   Previously following with urology, last office visit was in April 2023. Currently prescribed Estrace  vaginal cream for recurrent UTI. She is not currently using due to issues with her pharmacy.    Review of Systems  Constitutional:  Negative for fever.  Gastrointestinal:  Negative for abdominal pain.  Genitourinary:  Positive for dysuria and frequency. Negative for hematuria, urgency and vaginal discharge.         Past Medical History:  Diagnosis Date   Abnormal Pap smear of cervix    Allergy    Anxiety    Arthritis    Blood in stool    colonoscopy 02/2017   Chicken pox    Colon polyps    Depression    Diabetes mellitus without complication (HCC)    type 2   Diverticulitis    GERD (gastroesophageal reflux disease)    h/o ulcers ? GI    Glaucoma    borderline    Heart murmur    Hyperlipidemia    Hypertension    PONV (postoperative nausea and vomiting)    pull tubes out,etc.   Right knee pain    inj [facility] 11/2020   Shoulder arthritis    right shoulder Dr. Raquel Cables [facility]    Social History   Socioeconomic History   Marital status: Married    Spouse name: Not on file   Number of children: Not on file   Years of education: Not on file   Highest education level: Associate degree: occupational, Scientist, product/process development,  or vocational program  Occupational History   Not on file  Tobacco Use   Smoking status: Never   Smokeless tobacco: Never  Vaping Use   Vaping status: Never Used  Substance and Sexual Activity   Alcohol use: Yes    Alcohol/week: 6.0 - 10.0 standard drinks of alcohol    Types: 6 - 10 Standard drinks or equivalent per week    Comment: 1 bottle qhs most nights x 1.5 years prior to 03/2017   Drug use: No   Sexual activity: Yes  Other Topics Concern   Not on file  Social History Narrative   Lives with her husband.   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    16 chickens, 1 royster, 2 dogs like growing crops   Social Drivers of Corporate investment banker Strain: Low Risk  (10/24/2023)   Overall Financial Resource Strain (CARDIA)    Difficulty of Paying Living Expenses: Not hard at all  Food Insecurity: No Food Insecurity (10/24/2023)   Hunger Vital Sign    Worried About Running Out of Food in the Last Year: Never  true    Ran Out of Food in the Last Year: Never true  Transportation Needs: No Transportation Needs (10/24/2023)   PRAPARE - Administrator, Civil Service (Medical): No    Lack of Transportation (Non-Medical): No  Physical Activity: Insufficiently Active (10/24/2023)   Exercise Vital Sign    Days of Exercise per Week: 3 days    Minutes of Exercise per Session: 10 min  Stress: No Stress Concern Present (10/24/2023)   [employer] of Occupational Health - Occupational Stress Questionnaire    Feeling of Stress: Only a little  Social Connections: Moderately Integrated (10/24/2023)   Social Connection and Isolation Panel    Frequency of Communication with Friends and Family: Three times a week    Frequency of Social Gatherings with Friends and Family: Once a week    Attends Religious  Services: Patient declined    Active Member of Clubs or Organizations: Yes    Attends Banker Meetings: More than 4 times per year    Marital Status: Married  Catering manager Violence: Not At Risk (08/23/2021)   Humiliation, Afraid, Rape, and Kick questionnaire    Fear of Current or Ex-Partner: No    Emotionally Abused: No    Physically Abused: No    Sexually Abused: No    Past Surgical History:  Procedure Laterality Date   ANAL RECTAL MANOMETRY N/A 03/08/2017   Procedure: ANO RECTAL MANOMETRY;  Surgeon: Melvenia Stabs, MD;  Location: [facility];  Service: General;  Laterality: N/A;   BIOPSY  07/10/2023   Procedure: BIOPSY;  Surgeon: Selena Daily, MD;  Location: [facility];  Service: Gastroenterology;;   CARPAL TUNNEL RELEASE     COLONOSCOPY WITH PROPOFOL  N/A 03/07/2017   Procedure: COLONOSCOPY WITH PROPOFOL ;  Surgeon: Melvenia Stabs, MD;  Location: [facility];  Service: General;  Laterality: N/A;   COLONOSCOPY WITH PROPOFOL  N/A 07/10/2023   Procedure: COLONOSCOPY WITH PROPOFOL ;  Surgeon: Selena Daily, MD;  Location: [facility];  Service: Gastroenterology;  Laterality: N/A;   finergy surgery     cut finger sewn back on   HAND SURGERY     ring finger left hand    HEMORRHOID SURGERY     10/03/18 exc of mixed int/ext hemorrhoids x 3 columns Dr. Rogene Claude   JOINT REPLACEMENT     left  hip 2005, 2010, right hip 2010    TOTAL HIP ARTHROPLASTY  04/23/2012   Procedure: TOTAL HIP ARTHROPLASTY ANTERIOR APPROACH;  Surgeon: Aurther Blue, MD;  Location: [facility];  Service: Orthopedics;  Laterality: Right;   TOTAL HIP REVISION  04/23/2012   Procedure: TOTAL HIP REVISION;  Surgeon: Aurther Blue, MD;  Location: [facility];  Service: Orthopedics;  Laterality: Left;   TOTAL KNEE ARTHROPLASTY Left 02/17/2018   Procedure: LEFT TOTAL KNEE ARTHROPLASTY;  Surgeon: Liliane Rei, MD;  Location: [facility];  Service: Orthopedics;  Laterality: Left;    Family  History  Problem Relation Age of Onset   Hypertension Mother    Arthritis Mother    Heart disease Mother    Cancer Father        lung died age 54    Alcohol abuse Son    Cancer Other        colon caner     Allergies  Allergen Reactions   Latex     Skin irritation    Metformin And Related     Diarrhea    Propofol  Nausea And Vomiting  Can do with nausea meds    Current Outpatient Medications on File Prior to Visit  Medication Sig Dispense Refill   estradiol  (ESTRACE  VAGINAL) 0.1 MG/GM vaginal cream Place 1 Applicatorful vaginally at bedtime. Apply pea sized amount to urethral meatus at bedtime for 2 weeks then 2 times weekly. 42.5 g 12   fluocinonide  cream (LIDEX ) 0.05 % Apply 1 Application topically 2 (two) times daily. 30 g 1   hydrochlorothiazide  (HYDRODIURIL ) 12.5 MG tablet TAKE 1 TABLET BY MOUTH EVERY DAY 90 tablet 1   losartan  (COZAAR ) 50 MG tablet Take 1 tablet (50 mg total) by mouth daily. 90 tablet 3   mometasone  (ELOCON ) 0.1 % cream Apply 1 Application topically daily. 45 g 0   mometasone  (ELOCON ) 0.1 % lotion Apply topically daily. 60 mL 1   NON FORMULARY Tru you 2 caps daily if sxs 4 caps daily max 6 caps     NIFEdipine  (PROCARDIA -XL/NIFEDICAL-XL) 30 MG 24 hr tablet Take 1 tablet by mouth daily. (Patient not taking: Reported on 10/25/2023)     No current facility-administered medications on file prior to visit.    BP 126/74   Pulse 62   Temp (!) 97.2 F (36.2 C) (Temporal)   Ht 5' 3 (1.6 m)   Wt 108 lb (49 kg)   SpO2 98%   BMI 19.13 kg/m  Objective:   Physical Exam  Cardiovascular:     Rate and Rhythm: Normal rate and regular rhythm.  Pulmonary:     Effort: Pulmonary effort is normal.     Breath sounds: Normal breath sounds.  Abdominal:     Tenderness: There is no right CVA tenderness or left CVA tenderness.   Musculoskeletal:     Cervical back: Neck supple.   Skin:    General: Skin is warm and dry.   Neurological:     Mental Status: She is  alert and oriented to person, place, and time.   Psychiatric:        Mood and Affect: Mood normal.           Assessment & Plan:  Dysuria Assessment & Plan: UA today with 3+ leuks, 2+ blood. Chronic microscopic hematuria.  Urine culture ordered and pending. Reviewed urine culture from September 2024 which does show pan resistance.  Given history of recurrent cystitis, coupled with symptoms, will treat for presumed infection.  Start Bactrim DS (sulfamethoxazole/trimethoprim) tablets for urinary tract infection. Take 1 tablet by mouth twice daily for 3 days.  Follow-up as needed.  Orders: -     POCT Urinalysis Dipstick (Automated) -     Sulfamethoxazole-Trimethoprim; Take 1 tablet by mouth 2 (two) times daily. For urinary tract infection.  Dispense: 6 tablet; Refill: 0 -     Urine Culture        Gabriel John, NP

## 2023-10-25 NOTE — Telephone Encounter (Signed)
 Noted

## 2023-10-27 ENCOUNTER — Ambulatory Visit: Payer: Self-pay | Admitting: Primary Care

## 2023-10-27 LAB — URINE CULTURE
MICRO NUMBER:: 16578133
SPECIMEN QUALITY:: ADEQUATE

## 2023-10-31 ENCOUNTER — Encounter: Payer: Self-pay | Admitting: Family Medicine

## 2023-11-07 DIAGNOSIS — M1711 Unilateral primary osteoarthritis, right knee: Secondary | ICD-10-CM | POA: Diagnosis not present

## 2023-11-19 ENCOUNTER — Telehealth: Payer: Self-pay

## 2023-11-19 NOTE — Telephone Encounter (Signed)
 Copied from CRM [id number]. Topic: General - Call Back - No Documentation >> Nov 19, 2023  9:33 AM Tonda B wrote: Reason for CRM: patient is calling to get a sooner appt she has a surgery coming 01/2024 but she needs clearance by 01/06/2024 please call patient [phone]

## 2023-11-20 NOTE — Telephone Encounter (Signed)
 [facility] called stating patient was returning our call, but there was no documentation.  I do not see any documentation from today either.  I spoke with Karen Farr, CMA, who states she did not call patient, but patient can be scheduled with Dr. Hans Baptist regarding her medical clearance since we do not have any earlier appointments for TOC.  I relayed Daijah's message to [facility] and he states he will schedule patient.

## 2023-11-20 NOTE — Telephone Encounter (Signed)
 Noted

## 2023-12-16 ENCOUNTER — Ambulatory Visit (INDEPENDENT_AMBULATORY_CARE_PROVIDER_SITE_OTHER): Admitting: Internal Medicine

## 2023-12-16 ENCOUNTER — Encounter: Payer: Self-pay | Admitting: Internal Medicine

## 2023-12-16 ENCOUNTER — Telehealth: Payer: Self-pay

## 2023-12-16 VITALS — BP 114/72 | HR 70 | Temp 97.8°F | Ht 63.0 in | Wt 110.2 lb

## 2023-12-16 DIAGNOSIS — I10 Essential (primary) hypertension: Secondary | ICD-10-CM

## 2023-12-16 DIAGNOSIS — Z01818 Encounter for other preprocedural examination: Secondary | ICD-10-CM | POA: Insufficient documentation

## 2023-12-16 DIAGNOSIS — M1711 Unilateral primary osteoarthritis, right knee: Secondary | ICD-10-CM

## 2023-12-16 DIAGNOSIS — E785 Hyperlipidemia, unspecified: Secondary | ICD-10-CM | POA: Diagnosis not present

## 2023-12-16 LAB — CBC WITH DIFFERENTIAL/PLATELET
Basophils Absolute: 0 K/uL (ref 0.0–0.1)
Basophils Relative: 0.5 % (ref 0.0–3.0)
Eosinophils Absolute: 0.2 K/uL (ref 0.0–0.7)
Eosinophils Relative: 5.3 % — ABNORMAL HIGH (ref 0.0–5.0)
HCT: 41.3 % (ref 36.0–46.0)
Hemoglobin: 13.8 g/dL (ref 12.0–15.0)
Lymphocytes Relative: 32.7 % (ref 12.0–46.0)
Lymphs Abs: 1.4 K/uL (ref 0.7–4.0)
MCHC: 33.4 g/dL (ref 30.0–36.0)
MCV: 108.5 fl — ABNORMAL HIGH (ref 78.0–100.0)
Monocytes Absolute: 0.5 K/uL (ref 0.1–1.0)
Monocytes Relative: 11.3 % (ref 3.0–12.0)
Neutro Abs: 2.2 K/uL (ref 1.4–7.7)
Neutrophils Relative %: 50.2 % (ref 43.0–77.0)
Platelets: 170 K/uL (ref 150.0–400.0)
RBC: 3.81 Mil/uL — ABNORMAL LOW (ref 3.87–5.11)
RDW: 14.2 % (ref 11.5–15.5)
WBC: 4.3 K/uL (ref 4.0–10.5)

## 2023-12-16 LAB — LIPID PANEL
Cholesterol: 244 mg/dL — ABNORMAL HIGH (ref 0–200)
HDL: 117.4 mg/dL (ref >39.00)
LDL Cholesterol: 117 mg/dL — ABNORMAL HIGH (ref 0–99)
NonHDL: 126.6
Total CHOL/HDL Ratio: 2
Triglycerides: 50 mg/dL (ref 0.0–149.0)
VLDL: 10 mg/dL (ref 0.0–40.0)

## 2023-12-16 LAB — COMPREHENSIVE METABOLIC PANEL WITH GFR
ALT: 24 U/L (ref 0–35)
AST: 38 U/L — ABNORMAL HIGH (ref 0–37)
Albumin: 4.1 g/dL (ref 3.5–5.2)
Alkaline Phosphatase: 48 U/L (ref 39–117)
BUN: 10 mg/dL (ref 6–23)
CO2: 30 meq/L (ref 19–32)
Calcium: 10 mg/dL (ref 8.4–10.5)
Chloride: 102 meq/L (ref 96–112)
Creatinine, Ser: 0.52 mg/dL (ref 0.40–1.20)
GFR: 93.15 mL/min (ref >60.00)
Glucose, Bld: 83 mg/dL (ref 70–99)
Potassium: 4 meq/L (ref 3.5–5.1)
Sodium: 141 meq/L (ref 135–145)
Total Bilirubin: 0.6 mg/dL (ref 0.2–1.2)
Total Protein: 6.2 g/dL (ref 6.0–8.3)

## 2023-12-16 NOTE — Assessment & Plan Note (Signed)
-  She is scheduled for right total knee arthroplasty due to severe osteoarthritis with bone-on-bone contact in the right knee.  -Conservative treatments, including multiple injections, are no longer effective.  -She experiences significant pain and swelling, especially after physical activity, and cannot perform certain exercises or activities without worsening symptoms.  -There are no medical contraindications to proceeding with surgery from an internal medicine perspective.  - We will complete preoperative clearance form for the surgery once we receive it.  - We will check CMP, lipid and CBC today - No further workup at this time

## 2023-12-16 NOTE — Progress Notes (Signed)
 Acute Office Visit  Subjective:     Patient ID: Desiree Pittman, female    DOB: 18-Sep-1950, 73 y.o.   MRN: [id number]  Chief Complaint  Patient presents with   Acute Visit    Medical clearance for Right knee replacement on 02/06/24  Discussed the use of AI scribe software for clinical note transcription with the patient, who gave verbal consent to proceed.  HPI   Desiree Pittman is a 73 year old female with severe osteoarthritis who presents for preoperative clearance for right knee total replacement.  Right knee pain and functional limitation - Severe right knee pain with significant impact on ability to perform daily activities and exercise - Bone-on-bone contact in the right knee per orthopedic evaluation - History of multiple intra-articular injections for right knee pain; January injection provided relief for seven months, but subsequent injections were ineffective - Increased pain after physical activity and by the end of the day - Pain managed with ice and one to two glasses of wine nightly since January, particularly after physical activity - Knee pain and swelling, especially after physical activity - Scheduled for right total knee replacement  History of joint arthroplasty - History of bilateral hip replacements - History of left knee replacement  Physical activity and lifestyle - Maintains an active lifestyle, attending the gym three times a week - Participates in cycling and Silver Sneakers classes - Knee pain limits participation in more strenuous activities such as aerobics or yoga without support - Completed a significant walking trip in January, which exacerbated knee pain - Planning a trip to [place] with her grandson and desires knee surgery prior to travel  Alcohol use for symptom management - Consumes one to two glasses of wine nightly since January to manage knee pain - No symptoms of alcohol dependence or withdrawal  Cardiopulmonary  symptoms - No history of heart disease - No chest pain or shortness of breath with exertion  Review of Systems  Constitutional: Negative.   HENT: Negative.    Respiratory: Negative.    Cardiovascular: Negative.   Gastrointestinal: Negative.   Musculoskeletal:  Positive for joint pain.       Complains of significant right knee pain and swelling at the end of the day  Skin: Negative.   Neurological: Negative.   Psychiatric/Behavioral: Negative.          Objective:    BP 114/72   Pulse 70   Temp 97.8 F (36.6 C)   Ht 5' 3 (1.6 m)   Wt 110 lb 3.2 oz (50 kg)   SpO2 99%   BMI 19.52 kg/m    Physical Exam Constitutional:      Appearance: Normal appearance.  HENT:     Head: Normocephalic and atraumatic.  Cardiovascular:     Rate and Rhythm: Normal rate and regular rhythm.     Heart sounds: Normal heart sounds.  Abdominal:     General: Bowel sounds are normal. There is no distension.     Palpations: Abdomen is soft.     Tenderness: There is no abdominal tenderness.  Musculoskeletal:        General: Tenderness present.     Comments: Patient with mild swelling and significant tenderness palpation over her right knee  Neurological:     Mental Status: She is alert and oriented to person, place, and time. Mental status is at baseline.  Psychiatric:        Mood and Affect: Mood normal.  Behavior: Behavior normal.     No results found for any visits on 12/16/23.      Assessment & Plan:   Problem List Items Addressed This Visit       Cardiovascular and Mediastinum   Essential hypertension - Primary   - This problem is chronic and stable -Patient blood pressure is at goal today (114/72) -Will continue with losartan  50 mg daily as well as hydrochlorothiazide  12.5 mg daily - No further workup at this time      Relevant Orders   Comprehensive metabolic panel with GFR   CBC with Differential/Platelet     Musculoskeletal and Integument   Osteoarthritis of  right knee   -She is scheduled for right total knee arthroplasty due to severe osteoarthritis with bone-on-bone contact in the right knee.  -Conservative treatments, including multiple injections, are no longer effective.  -She experiences significant pain and swelling, especially after physical activity, and cannot perform certain exercises or activities without worsening symptoms.  -There are no medical contraindications to proceeding with surgery from an internal medicine perspective.  - We will complete preoperative clearance form for the surgery once we receive it.  - We will check CMP, lipid and CBC today - No further workup at this time        Other   HLD (hyperlipidemia)   - This problem is chronic and stable -Will recheck a lipid panel today -No further workup at this time      Relevant Orders   CBC with Differential/Platelet   Lipid panel   Preop examination   -She is scheduled for right total knee arthroplasty due to severe osteoarthritis with bone-on-bone contact in the right knee.  - RCRI score today was 0 indicating a 0.5% risk of major cardiac event -Patient has METS greater than 4 (goes to the gym 3 times a week and does cardio)  -There are no medical contraindications to proceeding with surgery from an internal medicine perspective.  - We will complete preoperative clearance for arm for the surgery once we receive it.  - We will check order CBC, CMP prior to surgery  - We will communicate with anesthesiology regarding her daily wine consumption but suspect that she is low risk for dependence and withdrawal      Relevant Orders   CBC with Differential/Platelet    No orders of the defined types were placed in this encounter.   No follow-ups on file.  Hans Baptist, MD     History of Present Illness   Assessment & Plan

## 2023-12-16 NOTE — Assessment & Plan Note (Signed)
-   This problem is chronic and stable -Will recheck a lipid panel today -No further workup at this time

## 2023-12-16 NOTE — Assessment & Plan Note (Signed)
-  She is scheduled for right total knee arthroplasty due to severe osteoarthritis with bone-on-bone contact in the right knee.  - RCRI score today was 0 indicating a 0.5% risk of major cardiac event -Patient has METS greater than 4 (goes to the gym 3 times a week and does cardio)  -There are no medical contraindications to proceeding with surgery from an internal medicine perspective.  - We will complete preoperative clearance for arm for the surgery once we receive it.  - We will check order CBC, CMP prior to surgery  - We will communicate with anesthesiology regarding her daily wine consumption but suspect that she is low risk for dependence and withdrawal

## 2023-12-16 NOTE — Assessment & Plan Note (Signed)
-   This problem is chronic and stable -Patient blood pressure is at goal today (114/72) -Will continue with losartan  50 mg daily as well as hydrochlorothiazide  12.5 mg daily - No further workup at this time

## 2023-12-16 NOTE — Telephone Encounter (Signed)
 Called [facility] Foster Aluisio's office and requested that the medical clearance form for the Patient's right knee replacement on 02/06/24 be faxed to our office at [phone].

## 2023-12-16 NOTE — Patient Instructions (Signed)
-   It was a pleasure meeting you today -There are no current medical contraindications to proceed with your surgery -We will check some blood work on you today including your kidney function, liver function, blood counts as well as cholesterol panel -Your blood pressure is well-controlled.  Continue with the current medication regimen -Good luck with your surgery and enjoy your trip to [place]!

## 2023-12-17 ENCOUNTER — Ambulatory Visit: Payer: Self-pay | Admitting: Internal Medicine

## 2023-12-17 DIAGNOSIS — D7589 Other specified diseases of blood and blood-forming organs: Secondary | ICD-10-CM

## 2023-12-17 NOTE — Progress Notes (Signed)
 Lvm for Pt to call the office back to go over her lab results. Whenever Pt does call back ok to relay results

## 2023-12-18 ENCOUNTER — Other Ambulatory Visit: Payer: Self-pay

## 2023-12-19 ENCOUNTER — Telehealth: Payer: Self-pay | Admitting: Internal Medicine

## 2023-12-19 NOTE — Telephone Encounter (Signed)
 Sent patient a Wellsite geologist.

## 2023-12-19 NOTE — Telephone Encounter (Signed)
 Last seen by Dr Jeffie. Patient has a TOC for next year.      Copied from CRM #[id number]. Topic: Clinical - Request for Lab/Test Order >> Dec 19, 2023 10:17 AM DeAngela L wrote: Reason for CRM: So yes the patient would like to schedule a lab appt for B12  and Folate level check  patient has a question about her red blood cell count being low (MCV blood low) (RBC blood Low) and she would like to ask if this related to Her B12 being low ?   Pt num [phone] (H) [phone] (M)

## 2023-12-23 NOTE — Telephone Encounter (Signed)
 Last read by Rollo [facility] at 8:21AM on 12/23/2023 on my chart.

## 2024-01-06 DIAGNOSIS — M25561 Pain in right knee: Secondary | ICD-10-CM | POA: Diagnosis not present

## 2024-02-05 DIAGNOSIS — M25761 Osteophyte, right knee: Secondary | ICD-10-CM | POA: Diagnosis not present

## 2024-02-05 DIAGNOSIS — M1711 Unilateral primary osteoarthritis, right knee: Secondary | ICD-10-CM | POA: Diagnosis not present

## 2024-02-05 DIAGNOSIS — G8918 Other acute postprocedural pain: Secondary | ICD-10-CM | POA: Diagnosis not present

## 2024-02-10 DIAGNOSIS — M25569 Pain in unspecified knee: Secondary | ICD-10-CM | POA: Diagnosis not present

## 2024-02-10 DIAGNOSIS — M79669 Pain in unspecified lower leg: Secondary | ICD-10-CM | POA: Diagnosis not present

## 2024-02-12 DIAGNOSIS — M25569 Pain in unspecified knee: Secondary | ICD-10-CM | POA: Diagnosis not present

## 2024-02-12 DIAGNOSIS — M79669 Pain in unspecified lower leg: Secondary | ICD-10-CM | POA: Diagnosis not present

## 2024-02-14 DIAGNOSIS — M79669 Pain in unspecified lower leg: Secondary | ICD-10-CM | POA: Diagnosis not present

## 2024-02-14 DIAGNOSIS — M25569 Pain in unspecified knee: Secondary | ICD-10-CM | POA: Diagnosis not present

## 2024-02-17 DIAGNOSIS — M79669 Pain in unspecified lower leg: Secondary | ICD-10-CM | POA: Diagnosis not present

## 2024-02-17 DIAGNOSIS — M25569 Pain in unspecified knee: Secondary | ICD-10-CM | POA: Diagnosis not present

## 2024-02-21 DIAGNOSIS — M25569 Pain in unspecified knee: Secondary | ICD-10-CM | POA: Diagnosis not present

## 2024-02-21 DIAGNOSIS — M79669 Pain in unspecified lower leg: Secondary | ICD-10-CM | POA: Diagnosis not present

## 2024-02-24 DIAGNOSIS — M79669 Pain in unspecified lower leg: Secondary | ICD-10-CM | POA: Diagnosis not present

## 2024-02-24 DIAGNOSIS — M25569 Pain in unspecified knee: Secondary | ICD-10-CM | POA: Diagnosis not present

## 2024-03-03 DIAGNOSIS — M25569 Pain in unspecified knee: Secondary | ICD-10-CM | POA: Diagnosis not present

## 2024-03-03 DIAGNOSIS — M79669 Pain in unspecified lower leg: Secondary | ICD-10-CM | POA: Diagnosis not present

## 2024-03-06 DIAGNOSIS — M79669 Pain in unspecified lower leg: Secondary | ICD-10-CM | POA: Diagnosis not present

## 2024-03-06 DIAGNOSIS — M25569 Pain in unspecified knee: Secondary | ICD-10-CM | POA: Diagnosis not present

## 2024-03-16 DIAGNOSIS — M79669 Pain in unspecified lower leg: Secondary | ICD-10-CM | POA: Diagnosis not present

## 2024-03-16 DIAGNOSIS — M25569 Pain in unspecified knee: Secondary | ICD-10-CM | POA: Diagnosis not present

## 2024-03-17 DIAGNOSIS — Z5189 Encounter for other specified aftercare: Secondary | ICD-10-CM | POA: Diagnosis not present

## 2024-05-21 ENCOUNTER — Other Ambulatory Visit: Payer: Self-pay

## 2024-05-21 DIAGNOSIS — Z1231 Encounter for screening mammogram for malignant neoplasm of breast: Secondary | ICD-10-CM

## 2024-05-26 ENCOUNTER — Inpatient Hospital Stay: Admission: RE | Admit: 2024-05-26 | Discharge: 2024-05-26

## 2024-05-26 DIAGNOSIS — Z1231 Encounter for screening mammogram for malignant neoplasm of breast: Secondary | ICD-10-CM

## 2024-05-29 ENCOUNTER — Ambulatory Visit: Payer: Self-pay

## 2024-05-29 NOTE — Progress Notes (Signed)
 Noted.  Jacklin Mascot, MD

## 2024-06-19 NOTE — Progress Notes (Signed)
 Shameca Landen                                          MRN: [id number]   06/19/2024   The [facility] Quality Team Specialist reviewed this patient medical record for the purposes of chart review for care gap closure. The following were reviewed: chart review for care gap closure-kidney health evaluation for diabetes:eGFR  and uACR.    [facility] Quality Team

## 2024-07-01 ENCOUNTER — Encounter
